# Patient Record
Sex: Female | Born: 1980 | Race: White | Hispanic: No | Smoking: Current every day smoker
Health system: Southern US, Community
[De-identification: ages and names within clinical notes are randomized; demographics above are authoritative.]

## PROBLEM LIST (undated history)

## (undated) DIAGNOSIS — G43909 Migraine, unspecified, not intractable, without status migrainosus: Secondary | ICD-10-CM

## (undated) DIAGNOSIS — Z9889 Other specified postprocedural states: Secondary | ICD-10-CM

## (undated) DIAGNOSIS — Z8709 Personal history of other diseases of the respiratory system: Secondary | ICD-10-CM

## (undated) DIAGNOSIS — F329 Major depressive disorder, single episode, unspecified: Secondary | ICD-10-CM

## (undated) DIAGNOSIS — G479 Sleep disorder, unspecified: Secondary | ICD-10-CM

## (undated) DIAGNOSIS — R519 Headache, unspecified: Secondary | ICD-10-CM

## (undated) DIAGNOSIS — T7840XA Allergy, unspecified, initial encounter: Secondary | ICD-10-CM

## (undated) DIAGNOSIS — F419 Anxiety disorder, unspecified: Secondary | ICD-10-CM

## (undated) DIAGNOSIS — J189 Pneumonia, unspecified organism: Secondary | ICD-10-CM

## (undated) DIAGNOSIS — R112 Nausea with vomiting, unspecified: Secondary | ICD-10-CM

## (undated) DIAGNOSIS — Z973 Presence of spectacles and contact lenses: Secondary | ICD-10-CM

## (undated) DIAGNOSIS — R51 Headache: Secondary | ICD-10-CM

## (undated) DIAGNOSIS — G8929 Other chronic pain: Secondary | ICD-10-CM

## (undated) DIAGNOSIS — M1990 Unspecified osteoarthritis, unspecified site: Secondary | ICD-10-CM

## (undated) DIAGNOSIS — J45909 Unspecified asthma, uncomplicated: Secondary | ICD-10-CM

## (undated) DIAGNOSIS — D320 Benign neoplasm of cerebral meninges: Secondary | ICD-10-CM

## (undated) DIAGNOSIS — G4700 Insomnia, unspecified: Secondary | ICD-10-CM

## (undated) DIAGNOSIS — F32A Depression, unspecified: Secondary | ICD-10-CM

## (undated) DIAGNOSIS — N920 Excessive and frequent menstruation with regular cycle: Secondary | ICD-10-CM

## (undated) HISTORY — PX: WISDOM TOOTH EXTRACTION: SHX21

## (undated) HISTORY — DX: Major depressive disorder, single episode, unspecified: F32.9

## (undated) HISTORY — DX: Headache: R51

## (undated) HISTORY — PX: OTHER SURGICAL HISTORY: SHX169

## (undated) HISTORY — DX: Allergy, unspecified, initial encounter: T78.40XA

## (undated) HISTORY — DX: Depression, unspecified: F32.A

## (undated) HISTORY — DX: Sleep disorder, unspecified: G47.9

## (undated) HISTORY — DX: Unspecified osteoarthritis, unspecified site: M19.90

## (undated) HISTORY — DX: Anxiety disorder, unspecified: F41.9

## (undated) HISTORY — DX: Pneumonia, unspecified organism: J18.9

## (undated) HISTORY — PX: DILATION AND CURETTAGE OF UTERUS: SHX78

---
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
## 1998-01-04 ENCOUNTER — Encounter: Admission: RE | Admit: 1998-01-04 | Discharge: 1998-01-04 | Payer: Self-pay | Admitting: Obstetrics

## 1998-01-17 ENCOUNTER — Encounter: Admission: RE | Admit: 1998-01-17 | Discharge: 1998-01-17 | Payer: Self-pay | Admitting: Obstetrics & Gynecology

## 1998-01-17 ENCOUNTER — Ambulatory Visit (HOSPITAL_COMMUNITY): Admission: RE | Admit: 1998-01-17 | Discharge: 1998-01-17 | Payer: Self-pay | Admitting: Obstetrics

## 1998-01-25 ENCOUNTER — Encounter: Admission: RE | Admit: 1998-01-25 | Discharge: 1998-01-25 | Payer: Self-pay | Admitting: *Deleted

## 1998-02-07 ENCOUNTER — Inpatient Hospital Stay (HOSPITAL_COMMUNITY): Admission: AD | Admit: 1998-02-07 | Discharge: 1998-02-10 | Payer: Self-pay | Admitting: *Deleted

## 1998-02-14 ENCOUNTER — Inpatient Hospital Stay (HOSPITAL_COMMUNITY): Admission: AD | Admit: 1998-02-14 | Discharge: 1998-02-14 | Payer: Self-pay | Admitting: Obstetrics

## 1998-02-16 ENCOUNTER — Inpatient Hospital Stay (HOSPITAL_COMMUNITY): Admission: AD | Admit: 1998-02-16 | Discharge: 1998-02-16 | Payer: Self-pay | Admitting: Obstetrics

## 1998-07-10 ENCOUNTER — Emergency Department (HOSPITAL_COMMUNITY): Admission: EM | Admit: 1998-07-10 | Discharge: 1998-07-10 | Payer: Self-pay | Admitting: Emergency Medicine

## 1998-07-10 ENCOUNTER — Encounter: Payer: Self-pay | Admitting: Emergency Medicine

## 2004-03-22 ENCOUNTER — Ambulatory Visit: Payer: Self-pay | Admitting: Family Medicine

## 2004-03-29 ENCOUNTER — Ambulatory Visit: Payer: Self-pay | Admitting: Family Medicine

## 2004-04-12 ENCOUNTER — Other Ambulatory Visit: Admission: RE | Admit: 2004-04-12 | Discharge: 2004-04-12 | Payer: Self-pay | Admitting: Family Medicine

## 2004-04-12 ENCOUNTER — Ambulatory Visit: Payer: Self-pay | Admitting: Family Medicine

## 2004-05-27 ENCOUNTER — Ambulatory Visit: Payer: Self-pay | Admitting: Family Medicine

## 2004-11-21 ENCOUNTER — Ambulatory Visit: Payer: Self-pay | Admitting: Family Medicine

## 2005-02-17 ENCOUNTER — Ambulatory Visit: Payer: Self-pay | Admitting: Family Medicine

## 2005-07-02 ENCOUNTER — Ambulatory Visit: Payer: Self-pay | Admitting: Internal Medicine

## 2005-11-10 ENCOUNTER — Ambulatory Visit: Payer: Self-pay | Admitting: Family Medicine

## 2005-11-11 ENCOUNTER — Ambulatory Visit: Payer: Self-pay | Admitting: Family Medicine

## 2005-11-11 ENCOUNTER — Encounter: Admission: RE | Admit: 2005-11-11 | Discharge: 2005-11-11 | Payer: Self-pay | Admitting: Family Medicine

## 2005-11-13 ENCOUNTER — Ambulatory Visit (HOSPITAL_COMMUNITY): Admission: RE | Admit: 2005-11-13 | Discharge: 2005-11-13 | Payer: Self-pay | Admitting: Family Medicine

## 2006-06-02 ENCOUNTER — Ambulatory Visit: Payer: Self-pay | Admitting: Internal Medicine

## 2006-07-20 ENCOUNTER — Ambulatory Visit: Payer: Self-pay | Admitting: Family Medicine

## 2006-12-17 DIAGNOSIS — N760 Acute vaginitis: Secondary | ICD-10-CM | POA: Insufficient documentation

## 2007-01-07 ENCOUNTER — Ambulatory Visit: Payer: Self-pay | Admitting: Family Medicine

## 2007-01-07 ENCOUNTER — Other Ambulatory Visit: Admission: RE | Admit: 2007-01-07 | Discharge: 2007-01-07 | Payer: Self-pay | Admitting: Family Medicine

## 2007-01-07 ENCOUNTER — Encounter: Payer: Self-pay | Admitting: Family Medicine

## 2007-01-07 DIAGNOSIS — F329 Major depressive disorder, single episode, unspecified: Secondary | ICD-10-CM

## 2007-01-07 DIAGNOSIS — R87619 Unspecified abnormal cytological findings in specimens from cervix uteri: Secondary | ICD-10-CM | POA: Insufficient documentation

## 2007-01-12 ENCOUNTER — Telehealth: Payer: Self-pay | Admitting: Family Medicine

## 2007-06-25 ENCOUNTER — Ambulatory Visit: Payer: Self-pay | Admitting: Family Medicine

## 2007-06-25 DIAGNOSIS — R51 Headache: Secondary | ICD-10-CM

## 2007-07-21 ENCOUNTER — Telehealth (INDEPENDENT_AMBULATORY_CARE_PROVIDER_SITE_OTHER): Payer: Self-pay | Admitting: *Deleted

## 2007-12-20 ENCOUNTER — Ambulatory Visit: Payer: Self-pay | Admitting: Family Medicine

## 2007-12-20 DIAGNOSIS — F411 Generalized anxiety disorder: Secondary | ICD-10-CM | POA: Insufficient documentation

## 2007-12-20 DIAGNOSIS — G479 Sleep disorder, unspecified: Secondary | ICD-10-CM

## 2007-12-22 ENCOUNTER — Telehealth: Payer: Self-pay | Admitting: Family Medicine

## 2008-02-11 DIAGNOSIS — J45909 Unspecified asthma, uncomplicated: Secondary | ICD-10-CM | POA: Insufficient documentation

## 2008-02-15 ENCOUNTER — Ambulatory Visit: Payer: Self-pay | Admitting: Family Medicine

## 2008-02-15 DIAGNOSIS — J069 Acute upper respiratory infection, unspecified: Secondary | ICD-10-CM | POA: Insufficient documentation

## 2008-02-15 DIAGNOSIS — F17200 Nicotine dependence, unspecified, uncomplicated: Secondary | ICD-10-CM

## 2008-05-09 ENCOUNTER — Ambulatory Visit: Payer: Self-pay | Admitting: Family Medicine

## 2008-05-18 ENCOUNTER — Encounter: Payer: Self-pay | Admitting: Family Medicine

## 2008-06-16 ENCOUNTER — Telehealth: Payer: Self-pay | Admitting: *Deleted

## 2008-06-27 ENCOUNTER — Telehealth: Payer: Self-pay | Admitting: Family Medicine

## 2008-09-15 ENCOUNTER — Ambulatory Visit: Payer: Self-pay | Admitting: Family Medicine

## 2009-01-04 ENCOUNTER — Telehealth: Payer: Self-pay | Admitting: Family Medicine

## 2009-07-13 ENCOUNTER — Ambulatory Visit: Payer: Self-pay | Admitting: Family Medicine

## 2010-02-07 ENCOUNTER — Telehealth: Payer: Self-pay | Admitting: Family Medicine

## 2010-04-30 NOTE — Letter (Signed)
Summary: Out of Work  Adult nurse at [employer]  [address]   [place], [place] [address]   Phone: [phone]  Fax: [phone]    July 13, 2009   Employee:  Angelica Diaz    To Whom It May Concern:   For Medical reasons, please excuse the above named employee from work for the following dates:  Start:   July 13, 2009  End:   July 16, 2009  If you need additional information, please feel free to contact our office.         Sincerely,    Kelle Darting, MD

## 2010-04-30 NOTE — Progress Notes (Signed)
Summary: REFILL REQUEST  Phone Note Refill Request Message from:  Patient on February 07, 2010 10:43 AM  Refills Requested: Medication #1:  HYDROMET 5-1.5 MG/5ML SYRP 1 to 2 tsps three times a day as needed.   Notes: Pt adv that she has cough, congestion - Offered OV, pt doesn't think she needs to be seen.Marland KitchenMarland KitchenReq refill be sent to [facility]  [address]    Initial call taken by: Debbra Riding,  February 07, 2010 10:44 AM  Follow-up for Phone Call        Hydromet 4 ounces one half to 1 teaspoon nightly p.r.n. for cough.  No refills ......... if symptoms persist after one week ......Marland Kitchenov Follow-up by: Roderick Pee MD,  February 07, 2010 3:14 PM    Prescriptions: HYDROMET 5-1.5 MG/5ML SYRP (HYDROCODONE-HOMATROPINE) 1 to 2 tsps three times a day as needed  #4 x 0   Entered by:   Kern Reap CMA (AAMA)   Authorized by:   Roderick Pee MD   Signed by:   Kern Reap CMA (AAMA) on 02/07/2010   Method used:   Telephoned to ...       [facility]  [address] [id number]       [address]       [place], [place]  [address]       Ph: [phone] or [phone]       Fax: [phone]   RxID:   [id number]

## 2010-04-30 NOTE — Assessment & Plan Note (Signed)
Summary: ?bronchitus/cjr   Vital Signs:  Patient profile:   30 year old female Height:      68 inches Weight:      173 pounds BMI:     26.40 Temp:     99.2 degrees F oral BP sitting:   98 / 76  (left arm) Cuff size:   regular  Vitals Entered By: Kern Reap CMA Duncan Dull) (July 13, 2009 8:49 AM) CC: cough, drainage, chills, ST Is Patient Diabetic? No   CC:  cough, drainage, chills, and ST.  History of Present Illness: Angelica Diaz is a 30 year old single female, G2, P2---------- children are 44 and 90------- smoker------- who comes in with a 3-day history of fever, chills, sore throat, headache, and cough.  Review of systems otherwise negative.  She was smoking 10 cigarettes a day because of severe, cough.  She's not smoked cigarettes in 24 hours.  In the past.  We gave her the chantix program.  She did it for 3 weeks.  Quit smoking, but then went back and start smoking and no side effects from medication  Allergies: No Known Drug Allergies  Past History:  Past medical, surgical, family and social histories (including risk factors) reviewed for relevance to current acute and chronic problems.  Past Medical History: Reviewed history from 12/20/2007 and no changes required. cb x2 Depression colposcopy 2007 for abnormal Pap went back for one follow-up never returned asymptomatic Headache Anxiety sleep disorder  Family History: Reviewed history from 06/25/2007 and no changes required. Family History Depression Family Hsitory Headaches  Social History: Reviewed history from 02/15/2008 and no changes required. Single  Occupation:  Alcohol use-no Drug use-no Regular exercise-no Current Smoker  Review of Systems      See HPI  Physical Exam  General:  Well-developed,well-nourished,in no acute distress; alert,appropriate and cooperative throughout examination Head:  Normocephalic and atraumatic without obvious abnormalities. No apparent alopecia or balding. Eyes:  No  corneal or conjunctival inflammation noted. EOMI. Perrla. Funduscopic exam benign, without hemorrhages, exudates or papilledema. Vision grossly normal. Ears:  External ear exam shows no significant lesions or deformities.  Otoscopic examination reveals clear canals, tympanic membranes are intact bilaterally without bulging, retraction, inflammation or discharge. Hearing is grossly normal bilaterally. Nose:  External nasal examination shows no deformity or inflammation. Nasal mucosa are pink and moist without lesions or exudates. Mouth:  Oral mucosa and oropharynx without lesions or exudates.  Teeth in good repair. Neck:  No deformities, masses, or tenderness noted. Chest Wall:  No deformities, masses, or tenderness noted. Lungs:  Normal respiratory effort, chest expands symmetrically. Lungs are clear to auscultation, no crackles or wheezes.   Problems:  Medical Problems Added: 1)  Dx of Viral Infection-unspec  (ICD-079.99)  Impression & Recommendations:  Problem # 1:  VIRAL INFECTION-UNSPEC (ICD-079.99) Assessment New  The following medications were removed from the medication list:    Hydromet 5-1.5 Mg/69ml Syrp (Hydrocodone-homatropine) .Marland Kitchen... 1 or 2 tsps three times a day as needed Her updated medication list for this problem includes:    Hydromet 5-1.5 Mg/28ml Syrp (Hydrocodone-homatropine) .Marland Kitchen... 1 to 2 tsps three times a day as needed  Orders: Prescription Created Electronically [phone]) Tobacco use cessation intermediate 3-10 minutes (47829)  Problem # 2:  TOBACCO ABUSE (ICD-305.1) Assessment: Improved  The following medications were removed from the medication list:    Chantix Continuing Month Pak 1 Mg Tabs (Varenicline tartrate) ..... Uad Her updated medication list for this problem includes:    Chantix Continuing Month Pak 1 Mg Tabs (  Varenicline tartrate) ..... Uad  Orders: Prescription Created Electronically [phone]) Tobacco use cessation intermediate 3-10 minutes  (60454)  Complete Medication List: 1)  Zoloft 100 Mg Tabs (Sertraline hcl) .Marland Kitchen.. 1 and 1/2 qam 2)  Chantix Continuing Month Pak 1 Mg Tabs (Varenicline tartrate) .... Uad 3)  Hydromet 5-1.5 Mg/82ml Syrp (Hydrocodone-homatropine) .Marland Kitchen.. 1 to 2 tsps three times a day as needed  Patient Instructions: 1)  continue not to smoke we start the chantix by taking a half of a blue pill daily x 1 week then one half tablet twice a day for 3 months. 2)  Rest at home, drink lots of liquids, Tylenol or aspirin for fever and chills, Hydromet one to 2 teaspoons 3 times a day p.r.n.Marland Kitchen  Return p.r.n. Prescriptions: HYDROMET 5-1.5 MG/5ML SYRP (HYDROCODONE-HOMATROPINE) 1 to 2 tsps three times a day as needed  #8oz x 1   Entered and Authorized by:   Roderick Pee MD   Signed by:   Roderick Pee MD on 07/13/2009   Method used:   Print then Give to Patient   RxID:   [id number] CHANTIX CONTINUING MONTH PAK 1 MG TABS (VARENICLINE TARTRATE) UAD  #1 x 3   Entered and Authorized by:   Roderick Pee MD   Signed by:   Roderick Pee MD on 07/13/2009   Method used:   [facility]        [facility]  [address] [id number]       [address]       [place], [place]  [address]       Ph: [phone] or [phone]       Fax: [phone]   RxID:   [id number]

## 2010-10-14 ENCOUNTER — Ambulatory Visit (INDEPENDENT_AMBULATORY_CARE_PROVIDER_SITE_OTHER): Payer: Self-pay | Admitting: Family Medicine

## 2010-10-14 ENCOUNTER — Encounter: Payer: Self-pay | Admitting: Family Medicine

## 2010-10-14 VITALS — BP 114/80 | HR 75 | Temp 98.5°F | Wt 157.0 lb

## 2010-10-14 DIAGNOSIS — N390 Urinary tract infection, site not specified: Secondary | ICD-10-CM

## 2010-10-14 DIAGNOSIS — N3090 Cystitis, unspecified without hematuria: Secondary | ICD-10-CM | POA: Insufficient documentation

## 2010-10-14 LAB — POCT URINALYSIS DIPSTICK
Bilirubin, UA: NEGATIVE
Glucose, UA: NEGATIVE
Ketones, UA: NEGATIVE
Nitrite, UA: NEGATIVE
Protein, UA: NEGATIVE
Spec Grav, UA: 1.02
Urobilinogen, UA: 0.2
pH, UA: 7

## 2010-10-14 MED ORDER — CEFTRIAXONE SODIUM 1 G IJ SOLR
1.0000 g | INTRAMUSCULAR | Status: AC
  Administered 2010-10-14: 1 g via INTRAMUSCULAR

## 2010-10-14 MED ORDER — CIPROFLOXACIN HCL 500 MG PO TABS: 500.0000 mg | ORAL_TABLET | Freq: Two times a day (BID) | ORAL | Status: AC

## 2010-10-14 NOTE — Progress Notes (Signed)
  Subjective:    Patient ID: Angelica Diaz, female    DOB: [birth year], 30 y.o.   MRN: [id number]  HPITara is a 30 year old single female, G2, P2, smoker, who comes in today for evaluation of 4 days history of frequency and dysuria and hematuria  Her last period was 10 days ago, normal.  Four days ago, she began having urinary tract symptoms.  Today, she began having flank pain, and chills.  No fever.  Last UTI was many, many years ago    Review of Systems    General and neurologic review of systems otherwise negative Objective:   Physical Exam    Well-developed nourished, female in no acute distress.  Examination had been to see and be flat.  The bowel sounds are normal.  No palpable masses, tenderness over the bladder.  No rebound, no flank tenderness    Assessment & Plan:  UTI question early pyeloplasty.  I am Rocephin followed by p.o. Cipro.  Follow-up in 3 days

## 2010-10-14 NOTE — Patient Instructions (Signed)
Drink lots of water.  Take the Cipro one twice daily.  Return on Thursday for follow-up.  Call if any problems

## 2010-10-14 NOTE — Progress Notes (Signed)
Addended by: Kern Reap B on: 10/14/2010 01:42 PM   Modules accepted: Orders

## 2010-10-15 LAB — URINE CULTURE
Colony Count: NO GROWTH
Organism ID, Bacteria: NO GROWTH

## 2010-10-16 ENCOUNTER — Telehealth: Payer: Self-pay | Admitting: Family Medicine

## 2010-10-16 MED ORDER — HYDROCODONE-ACETAMINOPHEN 7.5-750 MG PO TABS: 1.0000 | ORAL_TABLET | Freq: Four times a day (QID) | ORAL | Status: AC | PRN

## 2010-10-16 NOTE — Telephone Encounter (Signed)
Per Dr. Nelida Meuse instructions, the patient was advised to take Vicodin for the pain.  rx called in.

## 2010-10-16 NOTE — Telephone Encounter (Signed)
Pt called back this morning and stated that she isn't any better. She has an appt on tomorrow. Please advise. Back pain.

## 2010-10-17 ENCOUNTER — Ambulatory Visit: Payer: Self-pay | Admitting: Family Medicine

## 2010-11-18 ENCOUNTER — Telehealth: Payer: Self-pay | Admitting: Family Medicine

## 2010-11-18 NOTE — Telephone Encounter (Signed)
Spoke with patient.

## 2010-11-18 NOTE — Telephone Encounter (Signed)
Pt called back and is req that Dr Tawanna Cooler or his call her today asap re: Shingles Dx.

## 2010-11-18 NOTE — Telephone Encounter (Signed)
Bringing her into more so, we can see what is what

## 2010-11-18 NOTE — Telephone Encounter (Signed)
[facility] and was dx with shingles. Pt was advised to contact pcp to get treatment options. Pls call pt.

## 2010-11-19 ENCOUNTER — Ambulatory Visit (INDEPENDENT_AMBULATORY_CARE_PROVIDER_SITE_OTHER): Payer: Self-pay | Admitting: Family Medicine

## 2010-11-19 ENCOUNTER — Encounter: Payer: Self-pay | Admitting: Family Medicine

## 2010-11-19 VITALS — BP 108/78 | Temp 98.4°F | Wt 151.0 lb

## 2010-11-19 DIAGNOSIS — B029 Zoster without complications: Secondary | ICD-10-CM

## 2010-11-19 MED ORDER — ACYCLOVIR 400 MG PO TABS: 400.0000 mg | ORAL_TABLET | Freq: Every day | ORAL | Status: DC

## 2010-11-19 NOTE — Progress Notes (Signed)
  Subjective:    Patient ID: Angelica Diaz, female    DOB: Feb 10, 1981, 30 y.o.   MRN: [id number]  Gebhardt is a 30 year old single female, G2, P2, who comes in today with a 10 day history of a rash on her left posterior back.  She states about 10 days ago without any preceding pain.  She broke out in a rash.  The pain is minimal.  She is able to sleep.  It feels like pins-and-needles    Review of Systems General and dermatologic review of systems otherwise negative    Objective:   Physical Exam  Well-developed well-nourished, female in no acute distress.  Examination of the back shows a vesicular rash consistent with shingles, left upper back in the axillary area      Assessment & Plan:  Zoster plan acyclovir return p.r.n.

## 2010-11-19 NOTE — Patient Instructions (Signed)
Take the acyclovir 3 in the morning and two at bedtime until rash clears

## 2011-03-10 ENCOUNTER — Telehealth: Payer: Self-pay | Admitting: Family Medicine

## 2011-03-10 ENCOUNTER — Ambulatory Visit: Payer: Self-pay | Admitting: Family Medicine

## 2011-03-10 MED ORDER — HYDROCODONE-HOMATROPINE 5-1.5 MG/5ML PO SYRP: 2.5000 mL | ORAL_SOLUTION | Freq: Every evening | ORAL | Status: AC | PRN

## 2011-03-10 NOTE — Telephone Encounter (Signed)
Hydromet 4 ounces directions one half to 1 teaspoon nightly p.r.n. Cough, refills x 1 

## 2011-03-10 NOTE — Telephone Encounter (Signed)
Refill Hydromet to [facility] in [place]. Thanks.

## 2011-03-10 NOTE — Telephone Encounter (Signed)
rx called in and patient is aware 

## 2011-03-14 ENCOUNTER — Encounter: Payer: Self-pay | Admitting: Family Medicine

## 2011-03-14 ENCOUNTER — Ambulatory Visit (INDEPENDENT_AMBULATORY_CARE_PROVIDER_SITE_OTHER): Payer: Self-pay | Admitting: Family Medicine

## 2011-03-14 DIAGNOSIS — F17200 Nicotine dependence, unspecified, uncomplicated: Secondary | ICD-10-CM

## 2011-03-14 DIAGNOSIS — J069 Acute upper respiratory infection, unspecified: Secondary | ICD-10-CM

## 2011-03-14 DIAGNOSIS — J45901 Unspecified asthma with (acute) exacerbation: Secondary | ICD-10-CM

## 2011-03-14 MED ORDER — HYDROCODONE-HOMATROPINE 5-1.5 MG/5ML PO SYRP: ORAL_SOLUTION | ORAL | Status: DC

## 2011-03-14 MED ORDER — PREDNISONE 20 MG PO TABS: ORAL_TABLET | ORAL | Status: DC

## 2011-03-14 NOTE — Patient Instructions (Signed)
Rest at home today, Saturday and Sunday.  Tylenol or aspirin for fever, chills.  No smoking........... Stopped completely............ chantix one half tab daily for two months.  Hydromet one half to 1 teaspoon 3 to 4 times daily p.r.n. For cough.  Prednisone as directed.  Resume your normal activities on Monday

## 2011-03-14 NOTE — Progress Notes (Signed)
  Subjective:    Patient ID: Angelica Diaz, female    DOB: 10-Oct-1980, 30 y.o.   MRN: [id number]  HPI Angelica Diaz is a 30 year old single female, smoker, who comes in with a 4-day history of fever, chills, and cough.  No earache, nausea, vomiting, no sputum production.  She's not smoked a cigarette in 3 days   Review of Systems    General and pulmonary is systems otherwise negative Objective:   Physical Exam Well-developed well-nourished, female, in no acute distress.  HEENT negative.  Neck was supple.  No adenopathy.  Lungs are clear except for mild late expiratory wheezing       Assessment & Plan:  Viral syndrome with secondary asthma, tobacco abuse.  Plan stop smoking, chantix a half a tab a day, lots of liquids, Hydromet, short course of prednisone

## 2011-08-19 ENCOUNTER — Ambulatory Visit (INDEPENDENT_AMBULATORY_CARE_PROVIDER_SITE_OTHER): Payer: Self-pay | Admitting: Family Medicine

## 2011-08-19 ENCOUNTER — Encounter: Payer: Self-pay | Admitting: Family Medicine

## 2011-08-19 VITALS — BP 108/78 | Temp 99.0°F | Wt 172.0 lb

## 2011-08-19 DIAGNOSIS — G479 Sleep disorder, unspecified: Secondary | ICD-10-CM

## 2011-08-19 DIAGNOSIS — G478 Other sleep disorders: Secondary | ICD-10-CM

## 2011-08-19 MED ORDER — AMITRIPTYLINE HCL 25 MG PO TABS: 25.0000 mg | ORAL_TABLET | Freq: Every day | ORAL | Status: DC

## 2011-08-19 NOTE — Patient Instructions (Signed)
Begin the Elavil 25 mg at bedtime  Return in 4 weeks for followup sooner if any problems

## 2011-08-19 NOTE — Progress Notes (Signed)
  Subjective:    Patient ID: Angelica Diaz, female    DOB: 12-Feb-1981, 31 y.o.   MRN: [id number]  HPI Angelica Diaz is a 31 year old single female who comes in today for evaluation of insomnia x5 years  Her sleep pattern is that she typically will go to bed at 9:30 PM when I go to sleep until midnight and then wakes up every 2-3 hours during the night. We've treated this in the past with SSRIs however she had side effects from SSRIs and didn't take them anymore  Review of systems negative including the fact she only drinks very minimal amounts of caffeine   Review of Systems General and psychiatric review of systems otherwise negative    Objective:   Physical Exam Well-developed well-nourished female in no acute distress       Assessment & Plan:  Chronic insomnia plan trial of Elavil followup in 4 weeks

## 2011-09-16 ENCOUNTER — Ambulatory Visit: Payer: Self-pay | Admitting: Family Medicine

## 2011-09-16 DIAGNOSIS — Z0289 Encounter for other administrative examinations: Secondary | ICD-10-CM

## 2011-11-27 ENCOUNTER — Other Ambulatory Visit: Payer: Self-pay | Admitting: Family Medicine

## 2011-11-27 NOTE — Telephone Encounter (Signed)
Okay per Dr Todd 

## 2011-11-27 NOTE — Telephone Encounter (Signed)
Pt would like a refill klonipin call into [facility] [place] [phone]

## 2011-11-28 MED ORDER — CLONAZEPAM 0.5 MG PO TABS: 0.5000 mg | ORAL_TABLET | Freq: Every evening | ORAL | Status: DC | PRN

## 2011-11-28 NOTE — Telephone Encounter (Signed)
Rx phoned in.   

## 2011-12-23 ENCOUNTER — Other Ambulatory Visit: Payer: Self-pay | Admitting: Family Medicine

## 2011-12-31 ENCOUNTER — Telehealth: Payer: Self-pay | Admitting: Family Medicine

## 2011-12-31 MED ORDER — HYDROCODONE-HOMATROPINE 5-1.5 MG/5ML PO SYRP: 2.5000 mL | ORAL_SOLUTION | Freq: Every evening | ORAL | Status: DC | PRN

## 2011-12-31 NOTE — Telephone Encounter (Signed)
Rx called in 

## 2011-12-31 NOTE — Telephone Encounter (Signed)
Hydromet 4 ounces 1/2-1 teaspoon each bedtime. No refills

## 2011-12-31 NOTE — Telephone Encounter (Signed)
Patient called stating that she need a refill of her HYDROMET 5-1.5 MG/5ML syrup. Please assist.

## 2012-01-01 ENCOUNTER — Telehealth: Payer: Self-pay | Admitting: Family Medicine

## 2012-01-01 NOTE — Telephone Encounter (Signed)
Spoke with phamacist

## 2012-01-01 NOTE — Telephone Encounter (Signed)
Pt called to check on status of getting auth to get refill on cough syrup. Pt said that nurse or pcp has to call and speak directly to the pharmacist and say its ok to refill the script.

## 2012-01-01 NOTE — Telephone Encounter (Signed)
Pt called and said that pharmacy will not refill the cough syrup, unless they get a call from the pcp saying its ok to fill. Pls call  [id number] [facility] [place], [place] and tell pharmacist ok for pt to go ahead fill.

## 2012-01-12 ENCOUNTER — Telehealth: Payer: Self-pay | Admitting: Family Medicine

## 2012-01-12 ENCOUNTER — Telehealth: Payer: Self-pay | Admitting: *Deleted

## 2012-01-12 DIAGNOSIS — G4700 Insomnia, unspecified: Secondary | ICD-10-CM

## 2012-01-12 MED ORDER — AMITRIPTYLINE HCL 50 MG PO TABS: 50.0000 mg | ORAL_TABLET | Freq: Every day | ORAL | Status: DC

## 2012-01-12 NOTE — Telephone Encounter (Signed)
If that combination does not work then I would recommend we get a consult from Emerson Monte she can call and make an appointment

## 2012-01-12 NOTE — Telephone Encounter (Signed)
Increase Elavil to 50 mg daily call in 3 weeks with a progress report

## 2012-01-12 NOTE — Telephone Encounter (Signed)
Rx sent and patient is aware. 

## 2012-01-12 NOTE — Telephone Encounter (Signed)
Patient states that she is already tried taking 2 Elavil at night and Klonopin and she still is not resting.  Is there anything else she can try?

## 2012-01-12 NOTE — Telephone Encounter (Signed)
Pt states that the amitriptyline (ELAVIL) 25 MG tablet and the Klonopin are not working for sleep. She wants to know if you will send in for something different, or if she should come in for an OV to discuss. Please advise. Thanks.

## 2012-01-13 NOTE — Telephone Encounter (Signed)
Patient is aware and referral request sent 

## 2012-03-15 ENCOUNTER — Other Ambulatory Visit: Payer: Self-pay | Admitting: Family Medicine

## 2012-04-09 ENCOUNTER — Other Ambulatory Visit: Payer: Self-pay | Admitting: Family Medicine

## 2012-04-09 NOTE — Telephone Encounter (Signed)
Pt needs refill of HYDROcodone-homatropine (HYCODAN) 5-1.5 MG/5ML syrup, today of possible .[facility], [place]

## 2012-04-18 ENCOUNTER — Other Ambulatory Visit: Payer: Self-pay | Admitting: Family Medicine

## 2012-07-06 ENCOUNTER — Encounter: Payer: Self-pay | Admitting: Family Medicine

## 2012-07-06 ENCOUNTER — Ambulatory Visit (INDEPENDENT_AMBULATORY_CARE_PROVIDER_SITE_OTHER): Payer: BC Managed Care – PPO | Admitting: Family Medicine

## 2012-07-06 ENCOUNTER — Telehealth: Payer: Self-pay | Admitting: Family Medicine

## 2012-07-06 VITALS — BP 120/88 | Temp 98.9°F | Wt 224.0 lb

## 2012-07-06 DIAGNOSIS — R300 Dysuria: Secondary | ICD-10-CM

## 2012-07-06 DIAGNOSIS — G479 Sleep disorder, unspecified: Secondary | ICD-10-CM

## 2012-07-06 DIAGNOSIS — N760 Acute vaginitis: Secondary | ICD-10-CM

## 2012-07-06 LAB — POCT URINALYSIS DIPSTICK
Glucose, UA: NEGATIVE
Ketones, UA: NEGATIVE
Spec Grav, UA: 1.015

## 2012-07-06 NOTE — Telephone Encounter (Addendum)
Pt has had UTI (she thinks) for 4 weeks. Pt says her urine has a STRONG odor.  No pain, just the odor.   Pt wanted to come in today or ASAP. No appt this week. Pls advise.

## 2012-07-06 NOTE — Telephone Encounter (Signed)
Done

## 2012-07-06 NOTE — Telephone Encounter (Signed)
Spoke with patient. Please schedule today at 12:15

## 2012-07-06 NOTE — Progress Notes (Signed)
  Subjective:    Patient ID: Angelica Diaz, female    DOB: 1980-10-18, 32 y.o.   MRN: [id number]  HPI  Angelica Diaz is a 32 year old female smoker who comes in today for evaluation of and vaginal odor for one month  She's been seeing her GYN for Pap smears. She had a Pap smear in the fall which was normal. LMP 16 months ago. She's getting the Depakote shots  About a month motion noticed a strange odor in her vaginal area. She wonders if it might be a urinary tract infection although she's not having urinary tract symptoms.   Review of Systems    review of systems otherwise negative Objective:   Physical Exam  Well-developed well-nourished female no acute distress examination the abdomen is negative. Vaginal exam showed marked erythema bimanual exam negative urine normal      Assessment & Plan:

## 2012-07-06 NOTE — Patient Instructions (Signed)
Monistat 7......Marland Kitchen Apply each bedtime for one week return when necessary

## 2012-07-15 ENCOUNTER — Other Ambulatory Visit: Payer: Self-pay | Admitting: *Deleted

## 2012-07-15 MED ORDER — FLUCONAZOLE 150 MG PO TABS: ORAL_TABLET | ORAL | Status: DC

## 2012-08-27 ENCOUNTER — Telehealth: Payer: Self-pay | Admitting: *Deleted
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
## 2012-09-02 ENCOUNTER — Telehealth: Payer: Self-pay | Admitting: Family Medicine

## 2012-09-02 MED ORDER — METRONIDAZOLE 250 MG PO TABS: 250.0000 mg | ORAL_TABLET | Freq: Two times a day (BID) | ORAL | Status: DC

## 2012-09-02 NOTE — Telephone Encounter (Signed)
Pt was seen for yeast infection. Pt states on the fluconazole (DIFLUCAN) 150 MG tablet the yeast infection has come back. Pt would like to know if there is something stronger that she needs to take, or do the DIFLUCAN again. Pt states she had the yeast infection for about 3 months from the start. Pharm:  [facility]/ [place]

## 2012-09-02 NOTE — Telephone Encounter (Signed)
Rachel let's give [place], Flagyl 250 mg twice a day for 2 weeks for the vaginitis,,,,,,,, if that does not resolve her symptoms she'll need to come in for an office visit for cultures etc.

## 2012-09-02 NOTE — Telephone Encounter (Signed)
Rx sent to pharmacy. Left message on machine for patient.  

## 2012-09-10 ENCOUNTER — Ambulatory Visit (INDEPENDENT_AMBULATORY_CARE_PROVIDER_SITE_OTHER): Payer: Self-pay | Admitting: Family Medicine

## 2012-09-10 ENCOUNTER — Other Ambulatory Visit (HOSPITAL_COMMUNITY)
Admission: RE | Admit: 2012-09-10 | Discharge: 2012-09-10 | Disposition: A | Discharge status: Home / Self Care | Payer: BC Managed Care – PPO | Source: Ambulatory Visit | Attending: Family Medicine | Admitting: Family Medicine

## 2012-09-10 ENCOUNTER — Encounter: Payer: Self-pay | Admitting: Family Medicine

## 2012-09-10 VITALS — BP 110/70 | Temp 98.3°F | Wt 220.0 lb

## 2012-09-10 DIAGNOSIS — N390 Urinary tract infection, site not specified: Secondary | ICD-10-CM

## 2012-09-10 DIAGNOSIS — Z113 Encounter for screening for infections with a predominantly sexual mode of transmission: Secondary | ICD-10-CM | POA: Insufficient documentation

## 2012-09-10 DIAGNOSIS — N898 Other specified noninflammatory disorders of vagina: Secondary | ICD-10-CM

## 2012-09-10 DIAGNOSIS — L292 Pruritus vulvae: Secondary | ICD-10-CM

## 2012-09-10 DIAGNOSIS — L293 Anogenital pruritus, unspecified: Secondary | ICD-10-CM

## 2012-09-10 DIAGNOSIS — N760 Acute vaginitis: Secondary | ICD-10-CM | POA: Insufficient documentation

## 2012-09-10 LAB — POCT URINALYSIS DIPSTICK
Bilirubin, UA: 1
Leukocytes, UA: NEGATIVE
Nitrite, UA: NEGATIVE
Protein, UA: 1
pH, UA: 5.5

## 2012-09-10 NOTE — Patient Instructions (Signed)
-  no harsh detergents or soaps or douches - only hypoallergenic products  -We have ordered labs or studies at this visit. It can take up to 1-2 weeks for results and processing. We will contact you with instructions IF your results are abnormal. Normal results will be released to your [facility]. If you have not heard from [place] or can not find your results in [facility] in 2 weeks please contact our office.

## 2012-09-10 NOTE — Progress Notes (Signed)
Chief Complaint  Patient presents with  . Urinary Tract Infection    HPI:  Acute visit for dysuria: -reports started a few weeks ago -symptoms: dysuria - burning with urinary, odor to urine, more vaginal discharge then usual -denies: fevers, chills, NV, pelvic or flank pain, frequency or urgency, vag bleeding -recently tx with diflucan and flagyl - didn't help -FDLMP: on depo and no longer gets periods, sexually active, no new partnres, not worried about STIs ROS: See pertinent positives and negatives per HPI.  Past Medical History  Diagnosis Date  . Depression   . Headache(784.0)   . Anxiety   . Sleep disorder     Family History  Problem Relation Age of Onset  . Diabetes Maternal Grandfather     History   Social History  . Marital Status: Single    Spouse Name: N/A    Number of Children: N/A  . Years of Education: N/A   Social History Main Topics  . Smoking status: Current Every Day Smoker -- 0.50 packs/day  . Smokeless tobacco: None  . Alcohol Use: No  . Drug Use: No  . Sexually Active:    Other Topics Concern  . None   Social History Narrative  . None    Current outpatient prescriptions:amitriptyline (ELAVIL) 50 MG tablet, take 1 tablet by mouth at bedtime, Disp: 90 tablet, Rfl: 2;  clonazePAM (KLONOPIN) 0.5 MG tablet, take 1 tablet by mouth at bedtime if needed, Disp: 90 tablet, Rfl: 2;  fluconazole (DIFLUCAN) 150 MG tablet, One now and one in one week, Disp: 2 tablet, Rfl: 0;  metroNIDAZOLE (FLAGYL) 250 MG tablet, Take 1 tablet (250 mg total) by mouth 2 (two) times daily., Disp: 28 tablet, Rfl: 0  EXAM:  Filed Vitals:   09/10/12 0809  BP: 110/70  Temp: 98.3 F (36.8 C)    Body mass index is 33.46 kg/(m^2).  GENERAL: vitals reviewed and listed above, alert, oriented, appears well hydrated and in no acute distress  HEENT: atraumatic, conjunttiva clear, no obvious abnormalities on inspection of external nose and ears  NECK: no obvious masses on  inspection  LUNGS: clear to auscultation bilaterally, no wheezes, rales or rhonchi, good air movement  CV: HRRR, no peripheral edema  ABD: BS+,abd soft, NTTP  GU: normal, no CMT, no abnormal discharge  MS: moves all extremities without noticeable abnormality  PSYCH: pleasant and cooperative, no obvious depression or anxiety  ASSESSMENT AND PLAN:  Discussed the following assessment and plan:  UTI (urinary tract infection) - Plan: POCT urinalysis dipstick  Vaginal discharge - Plan: Cervicovaginal ancillary only  Vulvovaginal pruritus - Plan: Culture, Urine, Cervicovaginal ancillary only  -urine culture, wet prep, GC/Chlam pending. Will contact pt with results.If all neg advised she discuss with PCP to see specialist urology or gyn. -Patient advised to return or notify a doctor immediately if symptoms worsen or persist or new concerns arise.  Patient Instructions  -no harsh detergents or soaps or douches - only hypoallergenic products  -We have ordered labs or studies at this visit. It can take up to 1-2 weeks for results and processing. We will contact you with instructions IF your results are abnormal. Normal results will be released to your [facility]. If you have not heard from [place] or can not find your results in [facility] in 2 weeks please contact our office.            Kriste Basque R.

## 2012-09-12 LAB — URINE CULTURE: Colony Count: 75000

## 2012-09-13 MED ORDER — NITROFURANTOIN MONOHYD MACRO 100 MG PO CAPS: 100.0000 mg | ORAL_CAPSULE | Freq: Two times a day (BID) | ORAL | Status: DC

## 2012-09-13 NOTE — Progress Notes (Signed)
Quick Note:  Called and spoke with pt and pt is aware. Rx sent to pharmacy. ______ 

## 2012-09-13 NOTE — Addendum Note (Signed)
Addended by: Azucena Freed on: 09/13/2012 02:15 PM   Modules accepted: Orders

## 2012-09-14 NOTE — Progress Notes (Signed)
Quick Note:  Called and spoke with pt and pt is aware. ______ 

## 2012-09-15 ENCOUNTER — Telehealth: Payer: Self-pay | Admitting: Family Medicine

## 2012-09-15 NOTE — Telephone Encounter (Signed)
Pt had yeast infeciton - Dr Tawanna Cooler gave her Diflucan - worked at first, then cam eback. He gave her Flagyl BID for almost 2wks - it did not help. She came back in last Fri and saw Dr Selena Batten - UA done which was inconclusive for UTI. Pt thinks she has Bacterial Vaginosis really bad - not a UTI. Dr. Selena Batten rx'd Macrobid. Pt doesn't think that's right - since she's had BV before and does NOT think she has UTI. Since Flagyl did not work for BV, she wants to know if Dr Tawanna Cooler can rx something stronger. Please call.

## 2012-09-16 ENCOUNTER — Ambulatory Visit (INDEPENDENT_AMBULATORY_CARE_PROVIDER_SITE_OTHER): Payer: BC Managed Care – PPO | Admitting: Family Medicine

## 2012-09-16 ENCOUNTER — Encounter: Payer: Self-pay | Admitting: Family Medicine

## 2012-09-16 ENCOUNTER — Other Ambulatory Visit (HOSPITAL_COMMUNITY)
Admission: RE | Admit: 2012-09-16 | Discharge: 2012-09-16 | Disposition: A | Discharge status: Home / Self Care | Payer: BC Managed Care – PPO | Source: Ambulatory Visit | Attending: Family Medicine | Admitting: Family Medicine

## 2012-09-16 VITALS — BP 120/84 | Temp 98.5°F | Wt 221.0 lb

## 2012-09-16 DIAGNOSIS — N760 Acute vaginitis: Secondary | ICD-10-CM

## 2012-09-16 DIAGNOSIS — F329 Major depressive disorder, single episode, unspecified: Secondary | ICD-10-CM

## 2012-09-16 DIAGNOSIS — F17200 Nicotine dependence, unspecified, uncomplicated: Secondary | ICD-10-CM

## 2012-09-16 DIAGNOSIS — B029 Zoster without complications: Secondary | ICD-10-CM

## 2012-09-16 DIAGNOSIS — Z113 Encounter for screening for infections with a predominantly sexual mode of transmission: Secondary | ICD-10-CM | POA: Insufficient documentation

## 2012-09-16 DIAGNOSIS — A6004 Herpesviral vulvovaginitis: Secondary | ICD-10-CM | POA: Insufficient documentation

## 2012-09-16 MED ORDER — ACYCLOVIR 400 MG PO TABS: 400.0000 mg | ORAL_TABLET | Freq: Every day | ORAL | Status: AC

## 2012-09-16 MED ORDER — SERTRALINE HCL 100 MG PO TABS: 100.0000 mg | ORAL_TABLET | Freq: Every day | ORAL | Status: DC

## 2012-09-16 MED ORDER — METRONIDAZOLE 500 MG PO TABS: ORAL_TABLET | ORAL | Status: DC

## 2012-09-16 NOTE — Patient Instructions (Addendum)
Restart the Zoloft 100 mg daily  Acyclovir 400 mg,,,,,,,,,,, 2 tabs 3 times daily for 10 days  Restart Flagyl 500 mg,,,,,,,, one twice daily for 10 days  Return in 4 weeks for followup

## 2012-09-16 NOTE — Telephone Encounter (Signed)
Scheduled patient for visit today

## 2012-09-16 NOTE — Progress Notes (Signed)
  Subjective:    Patient ID: Angelica Diaz, female    DOB: 01-11-1981, 32 y.o.   MRN: [id number]  HPI Anetta is a 32 year old single female with a teenage daughter who comes in today for evaluation of 3 problems  We had seen her on June 1 with a diagnosis of BV and given her Flagyl. She took it for 10 days as directed the symptoms diminished somewhat but never went away and now it's worse. She has a slight discharge but the main thing is the malodorous  Her last period was a year and a half ago. [facility] followup Pap smears. She states she had a Pap smear in August which was normal  She's also had a recurrence of her herpes labialis which started a couple days ago.  Also in the past year she's gained 70 pounds. She states when she depressed she eats. She would like to restart her Zoloft   Review of Systems Review of systems otherwise negative    Objective:   Physical Exam Well-developed well-nourished female no acute distress examination the abdomen is negative except for stretch marks from previous pregnancy. Pelvic examination external genitalia area shows some mild herpetic lesions on both labia. Culture was taken       Assessment & Plan:  BV plan culture restart Flagyl  HSV 2 restart acyclovir  History of depression restart Zoloft 100 mg daily followup in 6 weeks

## 2012-09-17 ENCOUNTER — Telehealth: Payer: Self-pay | Admitting: *Deleted

## 2012-09-17 NOTE — Telephone Encounter (Signed)
Received a call from [facility] at the [facility].  They received the swab and requested testing.  They cannot do the Herpes from the swab.  Herpes can only be done in a Thin Prep container, therefore the herpes test will not be done.  FYI

## 2012-09-20 NOTE — Telephone Encounter (Signed)
Spoke with Angelica Diaz. Herpes test entered in error.

## 2012-09-28 ENCOUNTER — Telehealth: Payer: Self-pay | Admitting: Family Medicine

## 2012-09-28 NOTE — Telephone Encounter (Signed)
Pt states she was given rx for Flagyl. She said it's not working. Please advise.

## 2012-09-30 NOTE — Telephone Encounter (Signed)
Cindy please call ,,,,,,,,,, have her call [facility] and see Dr. Ivonne Andrew  or Dr. Ilda Mori for a consult because the medicines were given you are not working. She'll just need to call she can make her an appointment just use my name and no get her in right away next week

## 2012-10-04 ENCOUNTER — Other Ambulatory Visit: Payer: Self-pay | Admitting: Family Medicine

## 2012-10-08 ENCOUNTER — Telehealth: Payer: Self-pay | Admitting: Family Medicine

## 2012-10-08 NOTE — Telephone Encounter (Signed)
Pt states that Dr Tawanna Cooler sent in an ok for a refill on her Klonopin. However, the pharm will not let her fill it until 7/20, because on the bottle it states "take 1 at bedtime". Per pharmacy, to avoid this problem in the future (this happened last time as well, per pt), the directions need to say "take 1-2 at bedtime." Please advise.

## 2012-10-11 MED ORDER — CLONAZEPAM 0.5 MG PO TABS: ORAL_TABLET | ORAL | Status: DC

## 2012-10-11 NOTE — Telephone Encounter (Signed)
Okay to give her a six-month supply of Klonopin

## 2012-10-14 ENCOUNTER — Ambulatory Visit (INDEPENDENT_AMBULATORY_CARE_PROVIDER_SITE_OTHER): Payer: BC Managed Care – PPO | Admitting: Family Medicine

## 2012-10-14 ENCOUNTER — Encounter: Payer: Self-pay | Admitting: Family Medicine

## 2012-10-14 VITALS — BP 110/80 | Temp 98.6°F | Wt 218.0 lb

## 2012-10-14 DIAGNOSIS — N760 Acute vaginitis: Secondary | ICD-10-CM

## 2012-10-14 MED ORDER — METRONIDAZOLE 0.75 % VA GEL: VAGINAL | Status: DC

## 2012-10-14 NOTE — Progress Notes (Signed)
  Subjective:    Patient ID: Angelica Diaz, female    DOB: 24-Nov-1980, 32 y.o.   MRN: [id number]  HPI Angelica Diaz is a [age]-year-old single female G1 P1 he is engaged to be married who comes in today to discuss a persistent malodorous vaginitis  We have done a complete workup and we cannot find any etiology except for BV. We gave her 2 g of Flagyl and it hadn't helped. She states she has trouble swallowing the Flagyl and sometimes may miss a dose.  Her husband to be is getting a facetectomy in October. She's gained a lot of weight on the Depakote shots.   Review of Systems    review of systems negative Objective:   Physical Exam Well-developed well-nourished female no acute distress pelvic exam not repeated it's been done twice       Assessment & Plan:  Persistent BV switched to the vaginal cream

## 2012-10-14 NOTE — Patient Instructions (Signed)
Use the metronidazole gel one applicator nightly at bedtime.......Marland Kitchen If after the first round he is still symptomatic repeated  If after that you're having persistent symptoms then I would recommend a consult with Dr. Dessie Coma GYN

## 2012-12-02 ENCOUNTER — Telehealth: Payer: Self-pay | Admitting: Family Medicine

## 2012-12-02 ENCOUNTER — Encounter: Payer: Self-pay | Admitting: Family Medicine

## 2012-12-02 ENCOUNTER — Ambulatory Visit (INDEPENDENT_AMBULATORY_CARE_PROVIDER_SITE_OTHER): Payer: Self-pay | Admitting: Family Medicine

## 2012-12-02 VITALS — BP 108/78 | Temp 98.5°F | Wt 208.0 lb

## 2012-12-02 DIAGNOSIS — R202 Paresthesia of skin: Secondary | ICD-10-CM

## 2012-12-02 DIAGNOSIS — R209 Unspecified disturbances of skin sensation: Secondary | ICD-10-CM

## 2012-12-02 DIAGNOSIS — Z8669 Personal history of other diseases of the nervous system and sense organs: Secondary | ICD-10-CM

## 2012-12-02 NOTE — Patient Instructions (Signed)
-  We placed a referral for you as discussed to the neurologist. It usually takes about 1-2 weeks to process and schedule this referral. If you have not heard from [place] regarding this appointment in 2 weeks please contact our office.  -seek care/go to emergency room if recurring symptoms, weakness, numbness

## 2012-12-02 NOTE — Progress Notes (Signed)
Chief Complaint  Patient presents with  . facial numbness    HPI:  Angelica Diaz, a 32 yo patient of Dr. Tawanna Cooler with PMH anxiety, depression, headaches, sleep disorder, migraines, here for follow up of migraine: -long history of frequent headaches and migraines since she was a teenager, now gets headaches less frequently but still gets bad/severe migraines from time to time -had migraine last night and was accompanied by brief episode of sensation of numbness or tingling in entire face and felt lightheaded briefly too and felt like vision was different prior to onset of headache - thinks lasted maybe 20 minutes, then headache/one of her severe migraines lasted until this morning -no symptoms today -denies: fevers, chills, any residual symptoms, weakness with episode, CP, palpitations, drooping of face, visual distubances -she reports used to take migraine medications in the past but didn't work -  reports only vicodin with prednisone works for her migraines -has had nausea and vomiting with migraines before -she take klonopin and zoloft on a regular basis, no SI or worsening depression  ROS: See pertinent positives and negatives per HPI.  Past Medical History  Diagnosis Date  . Depression   . Headache(784.0)   . Anxiety   . Sleep disorder     No past surgical history on file.  Family History  Problem Relation Age of Onset  . Diabetes Maternal Grandfather     History   Social History  . Marital Status: Single    Spouse Name: N/A    Number of Children: N/A  . Years of Education: N/A   Social History Main Topics  . Smoking status: Current Every Day Smoker -- 0.50 packs/day  . Smokeless tobacco: None  . Alcohol Use: No  . Drug Use: No  . Sexual Activity:    Other Topics Concern  . None   Social History Narrative  . None    Current outpatient prescriptions:clonazePAM (KLONOPIN) 0.5 MG tablet, 1-2 tabs at bedtime as needed, Disp: 180 tablet, Rfl: 1;  sertraline  (ZOLOFT) 100 MG tablet, Take 1 tablet (100 mg total) by mouth daily., Disp: 100 tablet, Rfl: 3;  metroNIDAZOLE (METROGEL) 0.75 % vaginal gel, 1 applicator nightly at bedtime, Disp: 70 g, Rfl: 2  EXAM:  Filed Vitals:   12/02/12 1543  BP: 108/78  Temp: 98.5 F (36.9 C)    Body mass index is 31.63 kg/(m^2).  GENERAL: vitals reviewed and listed above, alert, oriented, appears well hydrated and in no acute distress  HEENT: atraumatic, PERRLA, visual acuity grossly intact, conjunttiva clear, no obvious abnormalities on inspection of external nose and ears, normal appearance of ear canals and TMs,, no tonsillar edema or exudate, no sinus TTP  NECK: no obvious masses on inspection, no bruits  LUNGS: clear to auscultation bilaterally, no wheezes, rales or rhonchi, good air movement  CV: HRRR, no peripheral edema  MS: moves all extremities without noticeable abnormality  PSYCH: pleasant and cooperative, no obvious depression or anxiety  NEURO: CN II-XII grossly intact, finger to nose normal, no pronator drift  ASSESSMENT AND PLAN:  Discussed the following assessment and plan:  Hx of migraine headaches - Plan: Ambulatory referral to Neurology  Paresthesia - Plan: Ambulatory referral to Neurology  -symptoms completely resolved and normal neuro exam and I suspect this was migraine related - discussed other potential, but much less likely etiologies, she will take asa daily and go to ED if recurring symptoms, weakness, worst HA or other worrisome symptoms -ptwants to see neurology - referral  placed for this and migraines which have not responded well to traditional treatments in the past and she reports only thing that works is Vicodin and prednisone but reports PCP did not want her to take these -Patient advised to return or notify a doctor immediately if symptoms worsen or persist or new concerns arise.  Patient Instructions  -We placed a referral for you as discussed to the  neurologist. It usually takes about 1-2 weeks to process and schedule this referral. If you have not heard from [place] regarding this appointment in 2 weeks please contact our office.  -seek care/go to emergency room if recurring symptoms, weakness, numbness      KIM, HANNAH R.

## 2012-12-02 NOTE — Telephone Encounter (Signed)
Patient Information:  Caller Name: Angelica Diaz  Phone: [phone]  Patient: Angelica, Diaz  Gender: Female  DOB: 10/14/1980  Age: 32 Years  PCP: Kelle Darting [facility])  Pregnant: No  Office Follow Up:  Does the office need to follow up with this patient?: No  Instructions For The Office: N/A  RN Note:  Current patient is asymptomatic except for lingering headache from migraine of last night.  Triaged with care advice given.  Appointment scheduled with Dr.Kim at 15:45 due to Dr. Tawanna Cooler not being available.  Caller demonstrated her understanding and will follow up with the appointment.  Symptoms  Reason For Call & Symptoms: Ok today, but last night she experienced her entire face going numb, with breaking out in a cold sweat.  She could not speak-but felt it was she did not want to. Lasted about 20 minutes and then it passed with a migraine following.  Felt that she could have fainted had she not sat down.  No history of diabetes.  Reviewed Health History In EMR: Yes  Reviewed Medications In EMR: Yes  Reviewed Allergies In EMR: Yes  Reviewed Surgeries / Procedures: Yes  Date of Onset of Symptoms: 12/01/2012 OB / GYN:  LMP: Unknown  Guideline(s) Used:  Neurologic Deficit  Disposition Per Guideline:   Go to Office Now  Reason For Disposition Reached:   Neurologic deficit that was transient (now gone), ANY of the following:   Weakness of the face, arm, or leg on one side of the body  Numbness of the face, arm, or leg on one side of the body  Loss of speech or garbled speech  Advice Given:  Call Back If:  Symptoms do not go away within 30 minutes  You become worse.  Patient Will Follow Care Advice:  YES  Appointment Scheduled:  12/02/2012 15:45:00 Appointment Scheduled Provider:  Kriste Basque [facility])

## 2012-12-10 ENCOUNTER — Ambulatory Visit: Payer: BC Managed Care – PPO | Admitting: Neurology

## 2012-12-10 ENCOUNTER — Ambulatory Visit (INDEPENDENT_AMBULATORY_CARE_PROVIDER_SITE_OTHER): Payer: BC Managed Care – PPO | Admitting: Neurology

## 2012-12-10 ENCOUNTER — Encounter: Payer: Self-pay | Admitting: Neurology

## 2012-12-10 VITALS — BP 106/78 | HR 92 | Temp 98.4°F | Resp 20 | Wt 208.0 lb

## 2012-12-10 DIAGNOSIS — G43901 Migraine, unspecified, not intractable, with status migrainosus: Secondary | ICD-10-CM

## 2012-12-10 NOTE — Patient Instructions (Addendum)
Your MRI is scheduled for Monday Sept. 22nd at 2:45pm.  Please arrive to [facility], first floor admitting by 2:30pm. You enter the hospital off of [address] at entrance [address].  [phone]

## 2012-12-10 NOTE — Progress Notes (Signed)
[facility] Neurology Division Clinic Note - Initial Visit   Date: December 10, 2012   Angelica Diaz MRN: [id number] DOB: 02-28-1981   Dear Dr Selena Batten:  Thank you for your kind referral of Angelica Diaz for consultation of migraines. Although her history is well known to you, please allow [place] to reiterate it for the purpose of our medical record. The patient was accompanied to the clinic by self.   History of Present Illness: Angelica Diaz is a 32 y.o. year-old right-handed Caucasian female with history of anxiety and depression presenting for evaluation of migraines.  Patient reports migraines started around the age of 32.  As a teenager to the age of 32, they would occur once per week, but this has improved since then.  Pain is characterized as throbbing pain all over her head and does not radiate into her arms or neck.  Duration is 1-3 days and frequency is once every 4-6 weeks.  She endorses photophobia, phonophobia, nausea, vomiting, and lack of appetite.  No flashing lights, numbness/tingling, or weakness.  Stress exacerbates symptoms.  Usually, she takes ibuprofen and sleeps, which helps.  Currently, pain is a 3/10 and when severe 10/10.  She takes ibuprofen 4-8 pills about 2-3x per week.  She has previously tried hydrocodone, reglan, and was given a prescription for zomig which she never filled.  On September 3, she was was celebrating her daughter's birthday and her entire face went completely numb.  She had associated slurring of her speech and she broke out in a cold sweat.  This lasted 20 minutes.   She sat down, otherwise she would have passed out.  About an hour later, she had the worst migraine she has ever encountered.  She placed an ice pack on her head, took ibuprofen, and tried to lay down.  Pain slowly resolved by the following afternoon.      On September 10, she went to the emergency department because she still was not feeling back to her baseline and continued  to have a dull holocephalic headache and new word-finding problems. She remembers talking and not being able to get the words out.  She had CT brain, but we do not have the results to review.  She received a headache cocktail, without significant relief and was told to follow-up with neurology.    All of the symptoms are improving and she is feeling almost back to her usual self.   Past Medical History  Diagnosis Date  . Depression   . Headache(784.0)   . Anxiety   . Sleep disorder     Past Surgical History  Procedure Laterality Date  . Mouth surgery       Medications:  Current Outpatient Prescriptions on File Prior to Visit  Medication Sig Dispense Refill  . clonazePAM (KLONOPIN) 0.5 MG tablet 1-2 tabs at bedtime as needed  180 tablet  1  . sertraline (ZOLOFT) 100 MG tablet Take 1 tablet (100 mg total) by mouth daily.  100 tablet  3   No current facility-administered medications on file prior to visit.    Allergies: No Known Allergies  Family History  Problem Relation Age of Onset  . Diabetes Maternal Grandfather   . Migraines Mother   . Migraines Son     Social History: History   Social History  . Marital Status: Single    Spouse Name: N/A    Number of Children: N/A  . Years of Education: N/A   Occupational History  .  Not on file.   Social History Main Topics  . Smoking status: Current Every Day Smoker -- 0.50 packs/day for 18 years  . Smokeless tobacco: Never Used  . Alcohol Use: No     Comment: Occasionally  . Drug Use: No  . Sexual Activity: Not on file   Other Topics Concern  . Not on file   Social History Narrative   Currently sells insurance.   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  She has two healthy children.    Review of Systems:  CONSTITUTIONAL: No fevers, chills, night sweats, or weight loss.   EYES: +visual changes, no eye pain ENT: No hearing changes.  No history of nose bleeds.   RESPIRATORY: No cough, wheezing and shortness of breath.    CARDIOVASCULAR: Negative for chest pain, and palpitations.   GI: Negative for abdominal discomfort, blood in stools or black stools.  No recent change in bowel habits.   GU:  No history of incontinence.   MUSCLOSKELETAL: No history of joint pain or swelling.  No myalgias.   SKIN: Negative for lesions, rash, and itching.   HEMATOLOGY/ONCOLOGY: Negative for prolonged bleeding, bruising easily, and swollen nodes.  No history of cancer.   ENDOCRINE: Negative for cold or heat intolerance, polydipsia or goiter.   PSYCH:  No depression or anxiety symptoms.   NEURO: As Above.   Vital Signs:  BP 106/78  Pulse 92  Temp(Src) 98.4 F (36.9 C)  Resp 20  Wt 208 lb (94.348 kg)  BMI 31.63 kg/m2 Pain Scale: 3 on a scale of 0-10   General Medical Exam:   General:  Well appearing, comfortable.   Eyes/ENT: see cranial nerve examination.   Neck: No masses appreciated.  Full range of motion without tenderness.  No carotid bruits. Respiratory:  Clear to auscultation, good air entry bilaterally.   Cardiac:  Regular rate and rhythm, no murmur.   GI:  Soft, non-tender, non-distended abdomen.  Bowel sounds normal.   Back:  No pain to palpation of spinous processes.   Extremities:  No deformities, edema, or skin discoloration.  Skin:  Skin color, texture, turgor normal. No rashes or lesions.  Neurological Exam: MENTAL STATUS including orientation to time, place, person, recent and remote memory, attention span and concentration, language, and fund of knowledge is normal.  Speech is not dysarthric, no semantic or paraphasic errors.  CRANIAL NERVES: II:  No visual field defects.  Unremarkable fundi.   III-IV-VI: Pupils equal round and reactive to light.  Normal conjugate, extra-ocular eye movements in all directions of gaze.  No nystagmus.  No ptosis bilaterally.  No Horner's.   V:  Normal facial sensation.  Jaw jerk is absent.   VII:  Normal facial symmetry and movements.  No pathologic facial reflexes.   VIII:  Normal hearing and vestibular function.   IX-X:  Normal palatal movement.   XI:  Normal shoulder shrug and head rotation.   XII:  Normal tongue strength and range of motion, no deviation or fasciculation.  MOTOR:  No atrophy, fasciculations or abnormal movements.  No pronator drift.  Tone is normal.    Right Upper Extremity:    Left Upper Extremity:    Deltoid  5/5   Deltoid  5/5   Biceps  5/5   Biceps  5/5   Triceps  5/5   Triceps  5/5   Wrist extensors  5/5   Wrist extensors  5/5   Wrist flexors  5/5   Wrist flexors  5/5   Finger  extensors  5/5   Finger extensors  5/5   Finger flexors  5/5   Finger flexors  5/5   Dorsal interossei  5-/5   Dorsal interossei  4+/5   Abductor pollicis  5/5   Abductor pollicis  5/5   Tone (Ashworth scale)  0  Tone (Ashworth scale)  0   Right Lower Extremity:    Left Lower Extremity:    Hip flexors  5/5   Hip flexors  5/5   Hip extensors  5/5   Hip extensors  5/5   Knee flexors  5/5   Knee flexors  5/5   Knee extensors  5/5   Knee extensors  5/5   Dorsiflexors  5/5   Dorsiflexors  5/5   Plantarflexors  5/5   Plantarflexors  5/5   Toe extensors  5/5   Toe extensors  5/5   Toe flexors  5/5   Toe flexors  5/5   Tone (Ashworth scale)  0  Tone (Ashworth scale)  0   MSRs:  Right                                                                 Left brachioradialis 2+  brachioradialis 2+  biceps 2+  biceps 2+  triceps 2+  triceps 2+  patellar 3+  patellar 3+  ankle jerk 2+  ankle jerk 2+  Hoffman yes  Hoffman yes  plantar response down  plantar response down  Crossed adductors present.  1-2 beat ankle clonus.  SENSORY:  Normal and symmetric perception of light touch, pinprick, vibration, and proprioception.  Romberg's sign absent.  No extinction on double simultaneous stimulation.    COORDINATION/GAIT: Normal finger-to- nose-finger and heel-to-shin.  Intact rapid alternating movements bilaterally.  Able to rise from a chair without using arms.   Gait narrow based and stable, stressed gait is intact, but she is unsteady with tandem gait.    Data: No records  IMPRESSION: Angelica Diaz is a pleasant 32 year old female who has a known history of migraines presenting with a new holocephalic headache associated with transient neurological symptoms (numbness of face, word-finding difficulties).  Her exam today is non-focal and does not reveal any particular deficits.  There is no Horner's on exam. Her reflexes are brisk throughout, but do not appear pathologic given the absence of other upper motor neuron findings and maybe normal for her age.  I am concerned about the abrupt change in quality and character of her headache, so will order MRI brain and vessel imaging to evaluate for ischemic changes and/or vascular abnormalities such as a dissection.    If this does not disclose any etiology, it is possible that her migraines are transitioning into migraine with aura.  Also, based on the amount of abortive medications she is using, she may have medication overuse headaches.  I have discussed the pathophysiology and treatment options, but would like to wait for the results of her imaging prior to starting preventative therapy.   PLAN/RECOMMENDATIONS:  1.  MRI brain  2.  MRA circle or willis and carotids  3.  Telephone update with results 4.  Return to clinic in 86-month or sooner as needed  The duration of this appointment visit was 60 minutes of face-to-face time with the  patient.  Greater than 50% of this time was spent in counseling, explanation of diagnosis, planning of further management, and coordination of care.   Thank you for allowing me to participate in patient's care.  If I can answer any additional questions, I would be pleased to do so.    Sincerely,    Kamilya Wakeman K. Posey Pronto, DO

## 2012-12-13 ENCOUNTER — Telehealth: Payer: Self-pay | Admitting: Family Medicine

## 2012-12-13 NOTE — Telephone Encounter (Signed)
Suggest patient take an additional Klonopin one hour prior to procedure

## 2012-12-13 NOTE — Telephone Encounter (Signed)
Angelica Diaz/Patient [phone] neurologist suggested she request an Rx for multiple brain MRIs (with and without contrast) and angiogram of carotid arteries (with and without contrast) ordered by Dr Allena Katz for 12/20/12. Seen in ED on 12/08/12; diagnosed with a brain tumor.  Saw neurologist on 12/10/12.  Currently takes Zoloft and Klonopin daily; taking more Klonopin than usual due to anxiety over diagnosis. Concerned about lying still for 2-3 hour procedure and possible uncomfortable reaction to contrast dyes.  Please call to advise if something can be ordered for day of procedures. [facility]/[place].

## 2012-12-14 ENCOUNTER — Telehealth: Payer: Self-pay | Admitting: Neurology

## 2012-12-14 NOTE — Telephone Encounter (Signed)
Patient states that Klonopin will not help.  She did ask Dr Beryl Meager but was told to call her PCP.  Appointment made with Dr Kirtland Bouchard.

## 2012-12-14 NOTE — Telephone Encounter (Signed)
Pt called Dr. Tawanna Cooler, Dr. Nelida Meuse wife passed away. He is out of the office until 12/29/12, his office is unable to prescribe anything for anxiety. Pt calling b/c she needs meds to relax her (for anxiety) during MRI. Please call pt / Sherri

## 2012-12-15 ENCOUNTER — Ambulatory Visit: Payer: Self-pay | Admitting: Internal Medicine

## 2012-12-15 MED ORDER — DIAZEPAM 5 MG PO TABS: ORAL_TABLET | ORAL | Status: DC

## 2012-12-15 NOTE — Telephone Encounter (Signed)
Patient to use valium 5mg  tablet 30 min prior to MRI for anxiety.

## 2012-12-15 NOTE — Telephone Encounter (Signed)
Pt aware and will come to pick up rx this week.

## 2012-12-20 ENCOUNTER — Ambulatory Visit (HOSPITAL_COMMUNITY)
Admission: RE | Admit: 2012-12-20 | Discharge: 2012-12-20 | Disposition: A | Discharge status: Home / Self Care | Payer: Self-pay | Source: Ambulatory Visit | Attending: Neurology | Admitting: Neurology

## 2012-12-20 ENCOUNTER — Ambulatory Visit (HOSPITAL_COMMUNITY): Payer: Self-pay

## 2012-12-20 DIAGNOSIS — G9389 Other specified disorders of brain: Secondary | ICD-10-CM | POA: Insufficient documentation

## 2012-12-20 DIAGNOSIS — G43909 Migraine, unspecified, not intractable, without status migrainosus: Secondary | ICD-10-CM | POA: Insufficient documentation

## 2012-12-20 DIAGNOSIS — G43901 Migraine, unspecified, not intractable, with status migrainosus: Secondary | ICD-10-CM

## 2012-12-20 MED ORDER — GADOBENATE DIMEGLUMINE 529 MG/ML IV SOLN
20.0000 mL | Freq: Once | INTRAVENOUS | Status: AC | PRN
  Administered 2012-12-20: 20 mL via INTRAVENOUS

## 2012-12-24 ENCOUNTER — Ambulatory Visit (INDEPENDENT_AMBULATORY_CARE_PROVIDER_SITE_OTHER): Payer: Self-pay | Admitting: Neurology

## 2012-12-24 ENCOUNTER — Encounter: Payer: Self-pay | Admitting: Neurology

## 2012-12-24 VITALS — BP 110/70 | HR 96 | Temp 98.3°F | Resp 20 | Ht 67.5 in | Wt 208.0 lb

## 2012-12-24 DIAGNOSIS — G43909 Migraine, unspecified, not intractable, without status migrainosus: Secondary | ICD-10-CM

## 2012-12-24 MED ORDER — KETOROLAC TROMETHAMINE 30 MG/ML IJ SOLN
30.0000 mg | Freq: Once | INTRAMUSCULAR | Status: AC
  Administered 2012-12-24: 30 mg via INTRAMUSCULAR

## 2012-12-24 MED ORDER — PROPRANOLOL HCL 20 MG PO TABS: ORAL_TABLET | ORAL | Status: DC

## 2012-12-24 MED ORDER — FLURBIPROFEN 50 MG PO TABS: 50.0000 mg | ORAL_TABLET | Freq: Two times a day (BID) | ORAL | Status: DC

## 2012-12-24 NOTE — Progress Notes (Signed)
Angelica Diaz  Follow-up Visit   Date: 12/24/2012    Angelica Diaz MRN: [id number] DOB: 08/22/1980   Interim History: Angelica Diaz is a 32 y.o. year-old right-handed Caucasian female with history of anxiety and depression returning to the clinic for follow-up of migraines.  The patient was accompanied to the clinic by self.  She was last seen on the clinic on 12/10/2012. At her last visit, we ordered MRI/A brain and carotids which did not show any abnormalities, except a small 7mm x 8mm x 5mm left frontal meningioma without mass effect.  Extra and intracranial vessels were patent.  Images were reviewed with the patient.   Since then, she continues to have migraines daily.  Pain is described as a dull ache, over the entire head, worse on the left side.  She took aspirin and benadryl once for the headache which helped.  With the severe headaches, she has nasuea, vomiting, photophobia, and phonophobia.  There is mild numbess/tingling on the left side the arm and leg, rarely over the left face.  Word-finding problems are improving, but she feels she is forgetting things easily.  She continues to go work and has not missed any days of work.  Pain is 4/10 currently, when it is severe is 10+ which has occurred 3 times since September 3rd.  There was one episode where she had a severe migraine at work and she walked to the kitchen and found herself on the floor.  Her colleagues said that she was sleeping and probably there for about 45 minutes, but she does not recall the event.  There was no tongue biting, bowel/bladder incontinence, or abnormal movements.  When she woke up, she felt fine and the headache was totally resolved.  She has not had any additional spells like this.   History of present illness: Patient reports migraines started around the age of 32. As a teenager to the age of 35, they would occur once per week, but this has improved since then. Pain is  characterized as throbbing pain all over her head and does not radiate into her arms or neck. Duration is 1-3 days and frequency is once every 4-6 weeks. She endorses photophobia, phonophobia, nausea, vomiting, and lack of appetite. No flashing lights, numbness/tingling, or weakness. Stress exacerbates symptoms. Usually, she takes ibuprofen and sleeps, which helps. Currently, pain is a 3/10 and when severe 10/10. She takes ibuprofen 4-8 pills about 2-3x per week. She has previously tried hydrocodone, reglan, and was given a prescription for zomig which she never filled.   On September 3, she was was celebrating her daughter's birthday and her entire face went completely numb. She had associated slurring of her speech and she broke out in a cold sweat. This lasted 20 minutes. She sat down, otherwise she would have passed out. About an hour later, she had the worst migraine she has ever encountered. She placed an ice pack on her head, took ibuprofen, and tried to lay down. Pain slowly resolved by the following afternoon. On September 10, she went to the emergency department because she still was not feeling back to her baseline and continued to have a dull holocephalic headache and new word-finding problems. She remembers talking and not being able to get the words out. She had CT brain, but we do not have the results to review. She received a headache cocktail, without significant relief.    Medications:  Current Outpatient Prescriptions on File Prior to Visit  Medication  Sig Dispense Refill  . clonazePAM (KLONOPIN) 0.5 MG tablet 1-2 tabs at bedtime as needed  180 tablet  1   No current facility-administered medications on file prior to visit.    Allergies: No Known Allergies   Review of Systems:  CONSTITUTIONAL: No fevers, chills, night sweats, or weight loss.  + memory problems EYES: No visual changes or eye pain ENT: No hearing changes.  No history of nose bleeds.   RESPIRATORY: No cough,  wheezing and shortness of breath.   CARDIOVASCULAR: Negative for chest pain, and palpitations.   GI: Negative for abdominal discomfort, blood in stools or black stools.  No recent change in bowel habits.   GU:  No history of incontinence.   MUSCLOSKELETAL: No history of joint pain or swelling.  No myalgias.   SKIN: Negative for lesions, rash, and itching.   HEMATOLOGY/ONCOLOGY: Negative for prolonged bleeding, bruising easily, and swollen nodes.  No history of cancer.   ENDOCRINE: Negative for cold or heat intolerance, polydipsia or goiter.   PSYCH:  No depression or anxiety symptoms.   NEURO: As Above.   Vital Signs:  BP 110/70  Pulse 96  Temp(Src) 98.3 F (36.8 C)  Resp 20  Ht 5' 7.5" (1.715 m)  Wt 208 lb (94.348 kg)  BMI 32.08 kg/m2 Pain Scale: 4 on a scale of 0-10    Neurological Exam: MENTAL STATUS including orientation to time, place, person, recent and remote memory, attention span and concentration, language, and fund of knowledge is normal.  Speech is not dysarthric.  CRANIAL NERVES: II:  No visual field defects.   III-IV-VI: Pupils equal round and reactive to light.  Normal conjugate, extra-ocular eye movements in all directions of gaze.  No nystagmus.  No ptosis  V:  Normal facial sensation.   VII:  Normal facial symmetry and movements.  VIII:  Normal hearing and vestibular function.   IX-X:  Normal palatal movement.   XI:  Normal shoulder shrug and head rotation.   XII:  Normal tongue strength and range of motion, no deviation or fasciculation.  MOTOR:  Motor strength is 5/5 in all four extremities. No atrophy, fasciculations or abnormal movements.  No pronator drift.  Tone is normal.  Increased muscle tension over the cervical region.  COORDINATION/GAIT: Gait narrow based and stable. Tandem and stressed gait intact.   Data: MRI brain 12/20/2012: Subcentimeter left posterior frontal extra-axial mass lesion, likely incidental meningioma. Otherwise negative  exam. MRA circle of willis and carotids 12/20/2012:  Patent vasculature   IMPRESSION: Angelica Diaz is a 32 year-old female presenting for evaluation of migraines.  Exam remains non-focal.  MRI/A brain and carotids shows left frontal meningioma and is otherwise unremarkable.  She continues to have a daily headache which seems to be responsive NSAIDs.  Because she was taking too much abortive medication at her last visit, I discussed to reduce the frequency which she has complied with.  Given no improvement in symptoms and because she has a daily headache, I will start her on preventative medication.  I discussed options of TCAs, AEDs, beta-blockers, and calcium channel blockers.  She is somewhat concerned about serotonin syndrome associated with Zoloft, so I will start her on propranolol.  Her baseline heart rates stays in the 90s (today it was 96), so I think this would be an appropriate agent.  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  No history of cardiac disease.  As an abortive agent, I will start Ansaid in  hopes that it is longer acting.  Going forward, I may add a muscle relaxant and/or consider a steroid taper if symptoms persist.  Risks and benefits of all medications discussed.   PLAN/RECOMMENDATIONS:  1.  Toradol 30mg  IM today 2.  Start propranolol as follows:  Week 1:  One tablet (20mg ) daily  Week 2:  One tablet (20mg ) morning and evening, and continue thereafter - will titrate further as needed 3.  Take Ansaid 50mg  tablets twice daily as needed for breakthrough pain, do not take more than twice per week.   4.  Warm compresses to neck  5.  List of headache supplements provided for review 6.  Patient to call with clinical update in 2 weeks 7.  Return to clinic in 52-month  The duration of this appointment visit was 40 minutes of face-to-face time with the patient.  Greater than 50% of this time was spent in counseling, explanation of diagnosis, planning of further  management, and coordination of care.   Thank you for allowing me to participate in patient's care.  If I can answer any additional questions, I would be pleased to do so.    Sincerely,    Donika K. Allena Katz, DO

## 2012-12-24 NOTE — Patient Instructions (Addendum)
1.  Toradol 30mg  IM today 2.  Start propranolol as follows:  Week 1:  One tablet (20mg ) daily  Week 2:  One tablet (20mg ) morning and evening, and continue thereafter 3.  Take Ansaid 50mg  tablets twice daily as needed for breakthrough pain, do not take more than twice per week 4.  Warm compresses to neck as needed 5.  List of headache supplements provided for review 6.  Please call with update in 2 weeks 7.  Return to clinic in 15-month

## 2012-12-28 ENCOUNTER — Telehealth: Payer: Self-pay

## 2012-12-28 MED ORDER — TIZANIDINE HCL 2 MG PO TABS: ORAL_TABLET | ORAL | Status: DC

## 2012-12-28 NOTE — Telephone Encounter (Signed)
Pt calling because she has changed her mind on the muscle relaxer you asked her about.  She would like to have one now.

## 2012-12-28 NOTE — Telephone Encounter (Signed)
Returned call to patient.  She reports being on propranolol for 3 days and has been tolerating the medication well and headahes have improved slightly.  She complains of increased muscle tension so will start tizanidine 2mg  at bedtime as needed.  Claus Silvestro K. Allena Katz, DO

## 2013-01-06 ENCOUNTER — Ambulatory Visit (INDEPENDENT_AMBULATORY_CARE_PROVIDER_SITE_OTHER): Payer: Self-pay | Admitting: Family Medicine

## 2013-01-06 ENCOUNTER — Encounter: Payer: Self-pay | Admitting: Family Medicine

## 2013-01-06 VITALS — BP 124/80 | Temp 98.4°F | Wt 212.0 lb

## 2013-01-06 DIAGNOSIS — Z720 Tobacco use: Secondary | ICD-10-CM

## 2013-01-06 DIAGNOSIS — G479 Sleep disorder, unspecified: Secondary | ICD-10-CM

## 2013-01-06 DIAGNOSIS — D320 Benign neoplasm of cerebral meninges: Secondary | ICD-10-CM

## 2013-01-06 DIAGNOSIS — D329 Benign neoplasm of meninges, unspecified: Secondary | ICD-10-CM | POA: Insufficient documentation

## 2013-01-06 DIAGNOSIS — F17200 Nicotine dependence, unspecified, uncomplicated: Secondary | ICD-10-CM

## 2013-01-06 MED ORDER — VARENICLINE TARTRATE 1 MG PO TABS: ORAL_TABLET | ORAL | Status: DC

## 2013-01-06 MED ORDER — LORAZEPAM 1 MG PO TABS: ORAL_TABLET | ORAL | Status: DC

## 2013-01-06 NOTE — Progress Notes (Signed)
  Subjective:    Patient ID: Angelica Diaz, female    DOB: 1980-09-18, 32 y.o.   MRN: [id number]  HPI  Angelica Diaz is a 32 year old female who comes in today for evaluation of sleep dysfunction  She's had a long-standing history of sleep dysfunction we tried different medications including Elavil. She's taken antidepressant that doesn't seem to help. Her sleep dysfunction his of 2 types she will can't go to sleep when she goes to bed when she goes to sleep and she would wake up.  She has a history of migraine headaches and recently had a bad migraine with some left facial numbness. She had one emergency room was found to have an 8 mm meningioma. She's due to see the neurosurgeon [place] for further evaluation  Review of Systems    review of systems otherwise negative as such is a persistent smoker declines a smoking cessation program Objective:   Physical Exam  Well-developed well-nourished female no acute distress vital signs stable she is afebrile      Assessment & Plan:  Sleep dysfunction trial of Ativan may refer to [facility] for further evaluation of sleep dysfunction process

## 2013-01-06 NOTE — Patient Instructions (Addendum)
Ativan 1 mg,,,,,,,,,, one half tab each bedtime when necessary for sleep if after one week and not sleeping well and take a full tablet at bedtime  Consult with your neurologist for followup on this issue also  The best person for sleep dysfunction his Angelica Diaz she  Chantix 1 mg......... one half tablet Monday Wednesday Friday for 3 weeks then one half tablet in the morning

## 2013-01-11 ENCOUNTER — Encounter: Payer: Self-pay | Admitting: Neurology

## 2013-01-17 ENCOUNTER — Encounter: Payer: Self-pay | Admitting: Neurology

## 2013-01-17 ENCOUNTER — Ambulatory Visit (INDEPENDENT_AMBULATORY_CARE_PROVIDER_SITE_OTHER): Payer: MEDICAID | Admitting: Neurology

## 2013-01-17 VITALS — BP 120/86 | HR 65 | Temp 98.6°F | Resp 14 | Ht 67.0 in | Wt 207.9 lb

## 2013-01-17 DIAGNOSIS — F329 Major depressive disorder, single episode, unspecified: Secondary | ICD-10-CM

## 2013-01-17 DIAGNOSIS — G4440 Drug-induced headache, not elsewhere classified, not intractable: Secondary | ICD-10-CM

## 2013-01-17 DIAGNOSIS — R51 Headache: Secondary | ICD-10-CM

## 2013-01-17 MED ORDER — SUMATRIPTAN SUCCINATE 50 MG PO TABS: 50.0000 mg | ORAL_TABLET | Freq: Once | ORAL | Status: DC | PRN

## 2013-01-17 MED ORDER — TOPIRAMATE 25 MG PO TABS: ORAL_TABLET | ORAL | Status: DC

## 2013-01-17 NOTE — Progress Notes (Signed)
[facility]  Follow-up Visit   Date: 01/17/2013    Angelica Diaz MRN: [id number] DOB: [birth year]   Interim History:e Angelica Diaz is a 32 y.o. year-old right-handed Caucasian female with history of anxiety and depression returning to the clinic for follow-up of migraines.  The patient was accompanied to the clinic by self.  She was last seen on the clinic on 12/24/2012.   She took propranolol 20mg  twice daily, but did not notice a change in her pain and started developing  vivid dreams and urinating more.  She does get relief with Ansaid and is taking it 1-2 twice per week.  She is not taking ibuprofen or tylenol.  She continues to have migraines daily, with pain over the entire headache (L >R).  She is concerned about short-term memory loss because it has not improved.  With the severe headaches, she has nasuea, vomiting, photophobia, and phonophobia.  She has missed 2 days in the past two weeks.    History of present illness: Intial visit 12/10/2012:  Patient reports migraines started around the age of 32. As a teenager to the age of 38, they would occur once per week, but this has improved since then. Pain is characterized as throbbing pain all over her head and does not radiate into her arms or neck. Duration is 1-3 days and frequency is once every 4-6 weeks. She endorses photophobia, phonophobia, nausea, vomiting, and lack of appetite. No flashing lights, numbness/tingling, or weakness. Stress exacerbates symptoms. Usually, she takes ibuprofen and sleeps, which helps. Currently, pain is a 3/10 and when severe 10/10. She takes ibuprofen 4-8 pills about 2-3x per week. She has previously tried hydrocodone, reglan, and was given a prescription for zomig which she never filled.   On September 3, she was was celebrating her daughter's birthday and her entire face went completely numb. She had associated slurring of her speech and she broke out in a cold sweat. This  lasted 20 minutes. She sat down, otherwise she would have passed out. About an hour later, she had the worst migraine she has ever encountered. She placed an ice pack on her head, took ibuprofen, and tried to lay down. Pain slowly resolved by the following afternoon. On September 10, she went to the emergency department because she still was not feeling back to her baseline and continued to have a dull holocephalic headache and new word-finding problems. She remembers talking and not being able to get the words out. She had CT brain, but we do not have the results to review. She received a headache cocktail, without significant relief.  1st follow-up 12/24/2012:  Started propranolol and ansaid.  MRI/A brain and carotids which did not show any abnormalities, except a small 7mm x 8mm x 5mm left frontal meningioma without mass effect. Extra and intracranial vessels were patent.    Medications:  Current Outpatient Prescriptions on File Prior to Visit  Medication Sig Dispense Refill  . flurbiprofen (ANSAID) 50 MG tablet Take 1 tablet (50 mg total) by mouth 2 (two) times daily. Take 1 tablet twice daily as needed, no more than twice per week.  20 tablet  0  . sertraline (ZOLOFT) 100 MG tablet Take 50 mg by mouth daily.      . varenicline (CHANTIX CONTINUING MONTH PAK) 1 MG tablet Uses directed  30 tablet  2   No current facility-administered medications on file prior to visit.    Allergies: No Known Allergies   Review of  Systems:  CONSTITUTIONAL: No fevers, chills, night sweats, or weight loss.  + memory problems EYES: No visual changes or eye pain ENT: No hearing changes.  No history of nose bleeds.   RESPIRATORY: No cough, wheezing and shortness of breath.   CARDIOVASCULAR: Negative for chest pain, and palpitations.   GI: Negative for abdominal discomfort, blood in stools or black stools.  No recent change in bowel habits.   GU:  No history of incontinence.   MUSCLOSKELETAL: No history of joint  pain or swelling.  No myalgias.   SKIN: Negative for lesions, rash, and itching.   HEMATOLOGY/ONCOLOGY: Negative for prolonged bleeding, bruising easily, and swollen nodes.   ENDOCRINE: Negative for cold or heat intolerance, polydipsia or goiter.   PSYCH:  +depression or anxiety symptoms.   NEURO: As Above.   Vital Signs:  BP 120/86  Pulse 65  Temp(Src) 98.6 F (37 C)  Resp 14  Ht 5\' 7"  (1.702 m)  Wt 207 lb 14.4 oz (94.303 kg)  BMI 32.55 kg/m2 Pain Scale: 4 on a scale of 0-10    Neurological Exam: Short term memory slightly impaired.  Awake, alert, oriented x 3. Speech is not dysarthric.  CRANIAL NERVES: III-IV-VI: Pupils equal round and reactive to light.  Normal conjugate, extra-ocular eye movements in all directions of gaze.  No nystagmus.  No ptosis  VII:  Normal facial symmetry and movements.   MOTOR:  Motor strength is 5/5 in all four extremities.   COORDINATION/GAIT: Gait narrow based and stable. Tandem and stressed gait intact.   Data: MRI brain 12/20/2012: Subcentimeter left posterior frontal extra-axial mass lesion, likely incidental meningioma. Otherwise negative exam. MRA circle of willis and carotids 12/20/2012:  Patent vasculature   IMPRESSION: 1. 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 MRI brain with subcentimeter left posterior frontal extra-axial mass lesion, likely incidental meningioma. Otherwise negative exam. Patent intra- and extra-cranial vessels.  - Previously tried propranolol (frequent urination, vivid dreams - not given fair trial due to intolerance), amitriptyline  - Start topiramate as noted below.  No plans for pregnancy, fiance has vasectomy.  Risks and benefits of TPM discussed. 2. Medication overuse headaches   - Discussed need to try to minimize frequency of PRN medications   - Start Ansaid for longer effect and triptan for severe headaches.  Discussed risks and benefits  including serotonin syndrome (very low risk) especially starting as a low dose 3. Depression  - possible short-term memory loss is due to this  - still with depressive symptoms, was referred to see psychiatry by PCP, but not scheduled appointment yet   PLAN/RECOMMENDATIONS:  1. Preventative therapy   Titration schedule for topiramate was provided as follows, risks and benefits discussed:        AM   PM   Week 1:   25mg    Week 2: 25 mg  25mg    Week 3: 25mg   50mg    Week 4: 50mg   50mg   2. Rescue therapy   For severe headaches, take sumatriptan 50mg  PO at onset of severe headaches, if no improvement in 2 hours, may take another pill.   For moderate headaches, take Ansaid 50mg  twice daily as needed.  3. Patient instructed not to take sumatriptan, Ansaid, OR advil more than 9 days a month. 4. Encouraged to see psychiatry for depression 5. Return to clinic in 45-month  The duration of this appointment visit was 40 minutes of face-to-face time with the patient.  Greater than 50% of this time was spent in counseling, explanation of diagnosis, planning of further management, and coordination of care.   Thank you for allowing me to participate in patient's care.  If I can answer any additional questions, I would be pleased to do so.    Sincerely,    Wylie Russon K. Posey Pronto, DO

## 2013-01-17 NOTE — Patient Instructions (Addendum)
1. Preventative therapy   Titration schedule for topiramate was provided as follows, risks and benefits discussed:        AM   PM   Week 1:   25mg    Week 2: 25 mg  25mg    Week 3: 25mg   50mg    Week 4: 50mg   50mg   2. Rescue therapy   For severe headaches, take sumatriptan 50mg  PO at onset of severe headaches, if no improvement in 2 hours, may take another pill.   For moderate headaches, take Ansaid 50mg  twice daily as needed.  3. Patient instructed not to take sumatriptan, Ansaid, OR advil more than 9 days a month. 4. Psychiatry consult 5. Return to clinic in 53-month

## 2013-01-18 ENCOUNTER — Telehealth: Payer: Self-pay | Admitting: Family Medicine

## 2013-01-18 NOTE — Telephone Encounter (Addendum)
Pt would like a refill of HYDROcodone-homatropine (HYDROMET) 5-1.5 MG/5ML syrup Pt states the cough just started today. Advised pt she may need appt.. Filled 2012.

## 2013-01-19 MED ORDER — HYDROCODONE-HOMATROPINE 5-1.5 MG/5ML PO SYRP: ORAL_SOLUTION | ORAL | Status: DC

## 2013-01-19 NOTE — Telephone Encounter (Signed)
Rx ready for pick and patient is aware 

## 2013-01-25 ENCOUNTER — Ambulatory Visit: Payer: Self-pay | Admitting: Neurology

## 2013-02-01 ENCOUNTER — Encounter (HOSPITAL_BASED_OUTPATIENT_CLINIC_OR_DEPARTMENT_OTHER): Payer: Self-pay | Admitting: Emergency Medicine

## 2013-02-01 ENCOUNTER — Emergency Department (HOSPITAL_BASED_OUTPATIENT_CLINIC_OR_DEPARTMENT_OTHER)
Admission: EM | Admit: 2013-02-01 | Discharge: 2013-02-01 | Disposition: A | Discharge status: Home / Self Care | Payer: Self-pay | Attending: Emergency Medicine | Admitting: Emergency Medicine

## 2013-02-01 DIAGNOSIS — Z79899 Other long term (current) drug therapy: Secondary | ICD-10-CM | POA: Insufficient documentation

## 2013-02-01 DIAGNOSIS — M2550 Pain in unspecified joint: Secondary | ICD-10-CM | POA: Insufficient documentation

## 2013-02-01 DIAGNOSIS — F17200 Nicotine dependence, unspecified, uncomplicated: Secondary | ICD-10-CM | POA: Insufficient documentation

## 2013-02-01 DIAGNOSIS — F3289 Other specified depressive episodes: Secondary | ICD-10-CM | POA: Insufficient documentation

## 2013-02-01 DIAGNOSIS — F329 Major depressive disorder, single episode, unspecified: Secondary | ICD-10-CM | POA: Insufficient documentation

## 2013-02-01 DIAGNOSIS — F411 Generalized anxiety disorder: Secondary | ICD-10-CM | POA: Insufficient documentation

## 2013-02-01 LAB — COMPREHENSIVE METABOLIC PANEL WITH GFR
ALT: 19 U/L (ref 0–35)
AST: 17 U/L (ref 0–37)
Albumin: 4.1 g/dL (ref 3.5–5.2)
Alkaline Phosphatase: 90 U/L (ref 39–117)
BUN: 17 mg/dL (ref 6–23)
CO2: 22 meq/L (ref 19–32)
Calcium: 10 mg/dL (ref 8.4–10.5)
Chloride: 103 meq/L (ref 96–112)
Creatinine, Ser: 0.7 mg/dL (ref 0.50–1.10)
GFR calc Af Amer: >90 mL/min
GFR calc non Af Amer: >90 mL/min
Glucose, Bld: 91 mg/dL (ref 70–99)
Potassium: 3.6 meq/L (ref 3.5–5.1)
Sodium: 138 meq/L (ref 135–145)
Total Bilirubin: 0.3 mg/dL (ref 0.3–1.2)
Total Protein: 7.8 g/dL (ref 6.0–8.3)

## 2013-02-01 LAB — CBC WITH DIFFERENTIAL/PLATELET
Basophils Absolute: 0 K/uL (ref 0.0–0.1)
Basophils Relative: 0 % (ref 0–1)
Eosinophils Absolute: 0.1 K/uL (ref 0.0–0.7)
Eosinophils Relative: 1 % (ref 0–5)
HCT: 44.4 % (ref 36.0–46.0)
Hemoglobin: 15 g/dL (ref 12.0–15.0)
Lymphocytes Relative: 34 % (ref 12–46)
Lymphs Abs: 3.4 K/uL (ref 0.7–4.0)
MCH: 30.7 pg (ref 26.0–34.0)
MCHC: 33.8 g/dL (ref 30.0–36.0)
MCV: 90.8 fL (ref 78.0–100.0)
Monocytes Absolute: 0.8 K/uL (ref 0.1–1.0)
Monocytes Relative: 8 % (ref 3–12)
Neutro Abs: 5.7 K/uL (ref 1.7–7.7)
Neutrophils Relative %: 57 % (ref 43–77)
Platelets: 242 K/uL (ref 150–400)
RBC: 4.89 MIL/uL (ref 3.87–5.11)
RDW: 13 % (ref 11.5–15.5)
WBC: 10.1 K/uL (ref 4.0–10.5)

## 2013-02-01 LAB — SEDIMENTATION RATE: Sed Rate: 9 mm/h (ref 0–22)

## 2013-02-01 MED ORDER — PREDNISONE 10 MG PO TABS: 20.0000 mg | ORAL_TABLET | Freq: Two times a day (BID) | ORAL | Status: DC

## 2013-02-01 MED ORDER — PREDNISONE 20 MG PO TABS
20.0000 mg | ORAL_TABLET | Freq: Once | ORAL | Status: AC
  Administered 2013-02-01: 20 mg via ORAL
  Filled 2013-02-01: qty 1

## 2013-02-01 MED ORDER — HYDROCODONE-ACETAMINOPHEN 5-325 MG PO TABS: 2.0000 | ORAL_TABLET | ORAL | Status: DC | PRN

## 2013-02-01 NOTE — ED Notes (Signed)
Pt reports generalized body aches that started a week ago.  States that her bilateral arms, wrists, elbows, legs, knees and ankles and feet hurt.  Reports that it started all of a sudden when she got out of bed.  Ambulatory.

## 2013-02-01 NOTE — ED Provider Notes (Signed)
CSN: [id number]     Arrival date & time 02/01/13  1831 History  This chart was scribed for Geoffery Lyons, MD by Dorothey Baseman, ED Scribe. This patient was seen in room MH03/MH03 and the patient's care was started at 7:07 PM.    Chief Complaint  Patient presents with  . Generalized Body Aches   The history is provided by the patient. No language interpreter was used.   HPI Comments: Angelica Diaz is a 32 y.o. female who presents to the Emergency Department complaining of constant, generalized arthralgias to the bilateral upper and lower extremities with sudden onset 1 week ago when she got out of bed. Patient reports that she has been ambulatory, but that the pains are exacerbated with walking and movement. She states that the areas feel swollen, but do not appear so. Patient reports taking ibuprofen at home a few days ago without relief. 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 She denies fever or recent illness. She denies familial history of similar symptoms. Patient reports that she takes Zoloft and Topamax daily for depression and migraines, respectively.   PCP- Dr. Kelle Darting  Past Medical History  Diagnosis Date  . Depression   . Headache(784.0)   . Anxiety   . Sleep disorder    Past Surgical History  Procedure Laterality Date  . Mouth surgery     Family History  Problem Relation Age of Onset  . Diabetes Maternal Grandfather   . Migraines Mother   . Migraines Son    History  Substance Use Topics  . Smoking status: Current Every Day Smoker -- 0.50 packs/day for 18 years    Types: Cigarettes  . Smokeless tobacco: Never Used  . Alcohol Use: No     Comment: Occasionally   OB History   Grav Para Term Preterm Abortions TAB SAB Ect Mult Living   2 2             Review of Systems  A complete 10 system review of systems was obtained and all systems are negative except as noted in the HPI and PMH.   Allergies  Review of  patient's allergies indicates no known allergies.  Home Medications   Current Outpatient Rx  Name  Route  Sig  Dispense  Refill  . flurbiprofen (ANSAID) 50 MG tablet   Oral   Take 1 tablet (50 mg total) by mouth 2 (two) times daily. Take 1 tablet twice daily as needed, no more than twice per week.   20 tablet   0   . HYDROcodone-homatropine (HYCODAN) 5-1.5 MG/5ML syrup      Half  To one teaspoon at bedtime as needed for sleep   120 mL   0   . sertraline (ZOLOFT) 100 MG tablet   Oral   Take 50 mg by mouth daily.         . SUMAtriptan (IMITREX) 50 MG tablet   Oral   Take 1 tablet (50 mg total) by mouth once as needed for migraine. May repeat in 2 hours if headache persists or recurs.   9 tablet   2   . topiramate (TOPAMAX) 25 MG tablet      Take one tab daily x 1 week, then take one tablet twice daily, then as directed   120 tablet   3   . varenicline (CHANTIX CONTINUING MONTH PAK) 1 MG tablet      Uses directed  30 tablet   2    Triage Vitals: BP 117/90  Pulse 80  Temp(Src) 98.9 F (37.2 C) (Oral)  Resp 18  Ht 5' 7.5" (1.715 m)  Wt 205 lb (92.987 kg)  BMI 31.62 kg/m2  SpO2 100%  Physical Exam  Nursing note and vitals reviewed. Constitutional: She is oriented to person, place, and time. She appears well-developed and well-nourished. No distress.  HENT:  Head: Normocephalic and atraumatic.  Eyes: Conjunctivae are normal.  Neck: Normal range of motion. Neck supple.  Cardiovascular: Normal rate, regular rhythm and normal heart sounds.   Pulmonary/Chest: Effort normal. No respiratory distress.  Abdominal: Soft. Bowel sounds are normal. She exhibits no distension. There is no tenderness.  Musculoskeletal: Normal range of motion.  Both elbows, wrists, knees, and ankles appear grossly normal. There is no effusion, warmth, or swelling. She reports pain with range of motion.   Neurological: She is alert and oriented to person, place, and time.  Skin: Skin is  warm and dry.  Psychiatric: She has a normal mood and affect. Her behavior is normal.    ED Course  Procedures (including critical care time)  Medications  predniSONE (DELTASONE) tablet 20 mg (20 mg Oral Given 02/01/13 1924)    DIAGNOSTIC STUDIES: Oxygen Saturation is 100% on room air, normal by my interpretation.    COORDINATION OF CARE: 7:10 PM- Discussed that symptoms are likely due to an inflammatory process. Will order blood labs and prednisone. Discussed treatment plan with patient at bedside and patient verbalized agreement.     Labs Review Labs Reviewed - No data to display Imaging Review No results found.  EKG Interpretation   None       MDM  No diagnosis found. Patient is a 32 year old female who presents here with complaints of pain in all of the joints of her arms and legs. There is no injury no trauma. Physical examination is unremarkable with the exception of pain with range of motion. There are no joint effusions, warmth, or erythema. She has no fever and laboratory studies are unremarkable. I have ordered an ANA and a sedimentation rate which are both pending at this time. These results will likely not be back until tomorrow at the soonest I have recommended she followup these results with her primary Dr. I will prescribe prednisone and pain medication in the meantime.  I personally performed the services described in this documentation, which was scribed in my presence. The recorded information has been reviewed and is accurate.       Geoffery Lyons, MD 02/01/13 2053

## 2013-02-02 ENCOUNTER — Encounter: Payer: Self-pay | Admitting: Family Medicine

## 2013-02-02 ENCOUNTER — Telehealth: Payer: Self-pay | Admitting: Family Medicine

## 2013-02-02 DIAGNOSIS — M2550 Pain in unspecified joint: Secondary | ICD-10-CM

## 2013-02-02 LAB — ANA: Anti Nuclear Antibody(ANA): NEGATIVE

## 2013-02-02 NOTE — Telephone Encounter (Signed)
Pt would like you to call her concerning her rheumatology appt.

## 2013-02-03 ENCOUNTER — Other Ambulatory Visit: Payer: Self-pay

## 2013-02-03 NOTE — Telephone Encounter (Signed)
Due to issues concerning to patient ... She should see rheumatology per Dr Tawanna Cooler

## 2013-02-03 NOTE — Telephone Encounter (Signed)
Pt following up on request for results. Pt unsure why she needs to go to a rheumatology md.

## 2013-02-03 NOTE — Telephone Encounter (Signed)
Patient would like to know her lab results

## 2013-02-04 NOTE — Telephone Encounter (Signed)
Spoke with patient and she will give Dr Ewell Poe office a call

## 2013-02-09 ENCOUNTER — Encounter: Payer: Self-pay | Admitting: Neurology

## 2013-02-18 ENCOUNTER — Ambulatory Visit: Payer: Self-pay | Admitting: Neurology

## 2013-02-21 ENCOUNTER — Encounter: Payer: Self-pay | Admitting: Family Medicine

## 2013-02-21 DIAGNOSIS — Z720 Tobacco use: Secondary | ICD-10-CM

## 2013-02-21 MED ORDER — VARENICLINE TARTRATE 1 MG PO TABS: ORAL_TABLET | ORAL | Status: DC

## 2013-02-21 NOTE — Telephone Encounter (Signed)
Rx sent to phramacy 

## 2013-03-01 ENCOUNTER — Encounter: Payer: Self-pay | Admitting: Neurology

## 2013-03-01 ENCOUNTER — Encounter: Payer: BC Managed Care – PPO | Admitting: Family Medicine

## 2013-03-01 MED ORDER — PREDNISONE 10 MG PO TABS: ORAL_TABLET | ORAL | Status: DC

## 2013-03-06 ENCOUNTER — Encounter: Payer: Self-pay | Admitting: Family Medicine

## 2013-03-12 ENCOUNTER — Encounter (HOSPITAL_BASED_OUTPATIENT_CLINIC_OR_DEPARTMENT_OTHER): Payer: Self-pay | Admitting: Emergency Medicine

## 2013-03-12 ENCOUNTER — Emergency Department (HOSPITAL_BASED_OUTPATIENT_CLINIC_OR_DEPARTMENT_OTHER)
Admission: EM | Admit: 2013-03-12 | Discharge: 2013-03-12 | Disposition: A | Discharge status: Home / Self Care | Payer: Self-pay | Attending: Emergency Medicine | Admitting: Emergency Medicine

## 2013-03-12 DIAGNOSIS — G43909 Migraine, unspecified, not intractable, without status migrainosus: Secondary | ICD-10-CM | POA: Insufficient documentation

## 2013-03-12 DIAGNOSIS — R05 Cough: Secondary | ICD-10-CM

## 2013-03-12 DIAGNOSIS — G479 Sleep disorder, unspecified: Secondary | ICD-10-CM | POA: Insufficient documentation

## 2013-03-12 DIAGNOSIS — R059 Cough, unspecified: Secondary | ICD-10-CM | POA: Insufficient documentation

## 2013-03-12 DIAGNOSIS — F17200 Nicotine dependence, unspecified, uncomplicated: Secondary | ICD-10-CM | POA: Insufficient documentation

## 2013-03-12 DIAGNOSIS — F329 Major depressive disorder, single episode, unspecified: Secondary | ICD-10-CM | POA: Insufficient documentation

## 2013-03-12 DIAGNOSIS — Z79899 Other long term (current) drug therapy: Secondary | ICD-10-CM | POA: Insufficient documentation

## 2013-03-12 DIAGNOSIS — IMO0002 Reserved for concepts with insufficient information to code with codable children: Secondary | ICD-10-CM | POA: Insufficient documentation

## 2013-03-12 DIAGNOSIS — F411 Generalized anxiety disorder: Secondary | ICD-10-CM | POA: Insufficient documentation

## 2013-03-12 DIAGNOSIS — R0982 Postnasal drip: Secondary | ICD-10-CM | POA: Insufficient documentation

## 2013-03-12 DIAGNOSIS — F3289 Other specified depressive episodes: Secondary | ICD-10-CM | POA: Insufficient documentation

## 2013-03-12 HISTORY — DX: Migraine, unspecified, not intractable, without status migrainosus: G43.909

## 2013-03-12 MED ORDER — HYDROCODONE-HOMATROPINE 5-1.5 MG/5ML PO SYRP: 5.0000 mL | ORAL_SOLUTION | Freq: Four times a day (QID) | ORAL | Status: DC | PRN

## 2013-03-12 MED ORDER — FLUTICASONE PROPIONATE 50 MCG/ACT NA SUSP: 2.0000 | Freq: Every day | NASAL | Status: DC

## 2013-03-12 NOTE — ED Notes (Addendum)
rx x 2 given for hydromet and flonase. D/c home with ride

## 2013-03-12 NOTE — ED Notes (Signed)
Pt reports cough that started today leading to chest soreness denies cardiac hx but admits to hx of bronchitis. Pt feels cough is worsening  And has had same in past and wants tx before symptoms turn into severe bronchitis as in past

## 2013-03-12 NOTE — ED Provider Notes (Signed)
CSN: [id number]     Arrival date & time 03/12/13  2231 History  This chart was scribed for Zyeir Dymek Smitty Cords, MD by Dorothey Baseman, ED Scribe. This patient was seen in room MH11/MH11 and the patient's care was started at 11:10 PM.    Chief Complaint  Patient presents with  . Cough   Patient is a 32 y.o. female presenting with cough. The history is provided by the patient. No language interpreter was used.  Cough Cough characteristics:  Dry Severity:  Moderate Onset quality:  Sudden Duration:  6 hours Timing:  Constant Progression:  Unchanged Chronicity:  Recurrent Smoker: yes   Context: not with activity   Relieved by:  Nothing Worsened by:  Nothing tried Ineffective treatments:  Cough suppressants Associated symptoms: no fever, no rash, no sinus congestion, no sore throat and no wheezing   Risk factors: no recent travel    HPI Comments: Angelica Diaz is a 31 y.o. female who presents to the Emergency Department complaining of a constant, dry cough onset earlier today that has been progressively worsening. She reports taking Delsym at home without relief. She denies congestion, fever, sore throat, diarrhea, or rashes. Patient reports a history of bronchitis and states her current symptoms feel similar. She denies any recent extended travel. Patient has no other pertinent medical history.   Past Medical History  Diagnosis Date  . Depression   . Headache(784.0)   . Anxiety   . Sleep disorder   . Migraine    Past Surgical History  Procedure Laterality Date  . Mouth surgery     Family History  Problem Relation Age of Onset  . Diabetes Maternal Grandfather   . Migraines Mother   . Migraines Son    History  Substance Use Topics  . Smoking status: Current Every Day Smoker -- 0.50 packs/day for 18 years    Types: Cigarettes  . Smokeless tobacco: Never Used  . Alcohol Use: No     Comment: Occasionally   OB History   Grav Para Term Preterm Abortions TAB SAB Ect Mult  Living   2 2             Review of Systems  Constitutional: Negative for fever.  HENT: Negative for congestion and sore throat.   Respiratory: Positive for cough. Negative for wheezing.   Gastrointestinal: Negative for diarrhea.  Skin: Negative for rash.  All other systems reviewed and are negative.    Allergies  Review of patient's allergies indicates no known allergies.  Home Medications   Current Outpatient Rx  Name  Route  Sig  Dispense  Refill  . flurbiprofen (ANSAID) 50 MG tablet   Oral   Take 1 tablet (50 mg total) by mouth 2 (two) times daily. Take 1 tablet twice daily as needed, no more than twice per week.   20 tablet   0   . HYDROcodone-acetaminophen (NORCO) 5-325 MG per tablet   Oral   Take 2 tablets by mouth every 4 (four) hours as needed.   12 tablet   0   . HYDROcodone-homatropine (HYCODAN) 5-1.5 MG/5ML syrup      Half  To one teaspoon at bedtime as needed for sleep   120 mL   0   . predniSONE (DELTASONE) 10 MG tablet      Take as follows: 6 tab, 5 tabs, 4 tabs, 3 tabs, 2 tabs, 1 tab, then one-half tablet, then STOP.   25 tablet   0   . sertraline (  ZOLOFT) 100 MG tablet   Oral   Take 50 mg by mouth daily.         . SUMAtriptan (IMITREX) 50 MG tablet   Oral   Take 1 tablet (50 mg total) by mouth once as needed for migraine. May repeat in 2 hours if headache persists or recurs.   9 tablet   2   . topiramate (TOPAMAX) 25 MG tablet      Take one tab daily x 1 week, then take one tablet twice daily, then as directed   120 tablet   3   . varenicline (CHANTIX CONTINUING MONTH PAK) 1 MG tablet      Uses directed   30 tablet   2    Triage Vitals: BP 113/81  Pulse 94  Temp(Src) 98.6 F (37 C) (Oral)  Resp 16  Wt 203 lb (92.08 kg)  SpO2 100%  Physical Exam  Nursing note and vitals reviewed. Constitutional: She is oriented to person, place, and time. She appears well-developed and well-nourished. No distress.  HENT:  Head:  Normocephalic and atraumatic.  Mouth/Throat: No oropharyngeal exudate.  Postnasal drip, cobblestoning  Eyes: Conjunctivae are normal. Pupils are equal, round, and reactive to light.  Neck: Normal range of motion. Neck supple.  Cardiovascular: Normal rate, regular rhythm and normal heart sounds.  Exam reveals no gallop and no friction rub.   No murmur heard. Pulmonary/Chest: Effort normal and breath sounds normal. No stridor. No respiratory distress. She has no wheezes. She has no rales.  Abdominal: Soft. Bowel sounds are normal. She exhibits no distension. There is no tenderness.  Musculoskeletal: Normal range of motion.  Neurological: She is alert and oriented to person, place, and time. She has normal reflexes.  Skin: Skin is warm and dry.  Psychiatric: She has a normal mood and affect. Her behavior is normal.    ED Course  Procedures (including critical care time)  DIAGNOSTIC STUDIES: Oxygen Saturation is 100% on room air, normal by my interpretation.    COORDINATION OF CARE: 11:15 PM- Will discharge patient with Hydromet and Flonase to manage symptoms. Discussed treatment plan with patient at bedside and patient verbalized agreement.     Labs Review Labs Reviewed - No data to display Imaging Review No results found.  EKG Interpretation   None       MDM  No diagnosis found. Post nasal drip.  Patient states she needs hydromet cough syrup.  Will prescribe flonase and short course of cough syrup  I personally performed the services described in this documentation, which was scribed in my presence. The recorded information has been reviewed and is accurate.      Amal Saiki Smitty Cords, MD 03/13/13 0001

## 2013-03-18 ENCOUNTER — Encounter: Payer: Self-pay | Admitting: Physician Assistant

## 2013-03-18 ENCOUNTER — Ambulatory Visit (INDEPENDENT_AMBULATORY_CARE_PROVIDER_SITE_OTHER): Payer: Self-pay | Admitting: Physician Assistant

## 2013-03-18 VITALS — BP 118/78 | HR 76 | Temp 98.3°F | Resp 16 | Ht 67.5 in | Wt 207.0 lb

## 2013-03-18 DIAGNOSIS — J45909 Unspecified asthma, uncomplicated: Secondary | ICD-10-CM

## 2013-03-18 MED ORDER — AZITHROMYCIN 250 MG PO TABS: ORAL_TABLET | ORAL | Status: DC

## 2013-03-18 MED ORDER — ALBUTEROL SULFATE HFA 108 (90 BASE) MCG/ACT IN AERS: 1.0000 | INHALATION_SPRAY | Freq: Four times a day (QID) | RESPIRATORY_TRACT | Status: DC | PRN

## 2013-03-18 MED ORDER — METHYLPREDNISOLONE (PAK) 4 MG PO TABS: ORAL_TABLET | ORAL | Status: DC

## 2013-03-18 NOTE — Patient Instructions (Signed)
Take antibiotic and steroid pack as prescribed.  Increase fluid intake.  Saline nasal spray.  Rest.  Place a humidifier in the bedroom.  Albuterol as prescribed if needed for wheeze.  Please call or return to clinic if symptoms not improving.

## 2013-03-18 NOTE — Progress Notes (Signed)
Pre visit review using our clinic review tool, if applicable. No additional management support is needed unless otherwise documented below in the visit note/SLS  

## 2013-03-20 DIAGNOSIS — J45909 Unspecified asthma, uncomplicated: Secondary | ICD-10-CM | POA: Insufficient documentation

## 2013-03-20 NOTE — Progress Notes (Signed)
Patient ID: Angelica Diaz, female   DOB: [birth year], 32 y.o.   MRN: [id number]  Patient presents to clinic today complaining of 6 days a call, chest tightness, chest congestion and shortness of breath. Patient endorses cough is productive of green sputum. Endorses transient fever. Denies sinus pressure or pain. Denies recent travel or sick contact. Was [facility] about 4 hours after symptom onset, was diagnosed with cough. Patient does have history of asthma. Has been using albuterol inhaler.   Past Medical History  Diagnosis Date  . Depression   . Headache(784.0)   . Anxiety   . Sleep disorder   . Migraine     Current Outpatient Prescriptions on File Prior to Visit  Medication Sig Dispense Refill  . flurbiprofen (ANSAID) 50 MG tablet Take 1 tablet (50 mg total) by mouth 2 (two) times daily. Take 1 tablet twice daily as needed, no more than twice per week.  20 tablet  0  . sertraline (ZOLOFT) 100 MG tablet Take 50 mg by mouth daily.      . SUMAtriptan (IMITREX) 50 MG tablet Take 1 tablet (50 mg total) by mouth once as needed for migraine. May repeat in 2 hours if headache persists or recurs.  9 tablet  2  . topiramate (TOPAMAX) 25 MG tablet Take one tab daily x 1 week, then take one tablet twice daily, then as directed  120 tablet  3   No current facility-administered medications on file prior to visit.    No Known Allergies  Family History  Problem Relation Age of Onset  . Diabetes Maternal Grandfather   . Migraines Mother   . Migraines Son     History   Social History  . Marital Status: Single    Spouse Name: N/A    Number of Children: N/A  . Years of Education: N/A   Social History Main Topics  . Smoking status: Former Smoker -- 0.50 packs/day for 18 years    Types: Cigarettes    Quit date: 03/04/2013  . Smokeless tobacco: Never Used  . Alcohol Use: No     Comment: Occasionally  . Drug Use: No  . Sexual Activity: None   Other Topics Concern  .  None   Social History Narrative   Currently sells insurance.   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  She has two healthy children.    Review of Systems - see history of present illness. All other review of systems are negative.   Filed Vitals:   03/18/13 1037  BP: 118/78  Pulse: 76  Temp: 98.3 F (36.8 C)  Resp: 16   Physical Exam  Vitals reviewed. Constitutional: She is oriented to person, place, and time and well-developed, well-nourished, and in no distress.  HENT:  Head: Normocephalic and atraumatic.  Right Ear: External ear normal.  Left Ear: External ear normal.  Nose: Nose normal.  Mouth/Throat: Oropharynx is clear and moist. No oropharyngeal exudate.  Tympanic membranes within normal limits bilaterally. No tenderness to percussion of sinuses noted.  Neck: Neck supple.  Cardiovascular: Normal rate, regular rhythm, normal heart sounds and intact distal pulses.   Pulmonary/Chest: Effort normal. No respiratory distress. She has wheezes. She has no rales. She exhibits no tenderness.  Lymphadenopathy:    She has no cervical adenopathy.  Neurological: She is alert and oriented to person, place, and time.  Skin: Skin is warm and dry. No rash noted.  Psychiatric: Affect normal.     Recent Results (from the past 2160 hour(s))  CBC WITH DIFFERENTIAL     Status: None   Collection Time    02/01/13  7:48 PM      Result Value Range   WBC 10.1  4.0 - 10.5 K/uL   RBC 4.89  3.87 - 5.11 MIL/uL   Hemoglobin 15.0  12.0 - 15.0 g/dL   HCT 40.9  81.1 - 91.4 %   MCV 90.8  78.0 - 100.0 fL   MCH 30.7  26.0 - 34.0 pg   MCHC 33.8  30.0 - 36.0 g/dL   RDW 78.2  95.6 - 21.3 %   Platelets 242  150 - 400 K/uL   Neutrophils Relative % 57  43 - 77 %   Neutro Abs 5.7  1.7 - 7.7 K/uL   Lymphocytes Relative 34  12 - 46 %   Lymphs Abs 3.4  0.7 - 4.0 K/uL   Monocytes Relative 8  3 - 12 %   Monocytes Absolute 0.8  0.1 - 1.0 K/uL   Eosinophils Relative 1  0 - 5 %   Eosinophils Absolute 0.1  0.0 - 0.7 K/uL    Basophils Relative 0  0 - 1 %   Basophils Absolute 0.0  0.0 - 0.1 K/uL  COMPREHENSIVE METABOLIC PANEL     Status: None   Collection Time    02/01/13  7:48 PM      Result Value Range   Sodium 138  135 - 145 mEq/L   Potassium 3.6  3.5 - 5.1 mEq/L   Chloride 103  96 - 112 mEq/L   CO2 22  19 - 32 mEq/L   Glucose, Bld 91  70 - 99 mg/dL   BUN 17  6 - 23 mg/dL   Creatinine, Ser 0.86  0.50 - 1.10 mg/dL   Calcium 57.8  8.4 - 46.9 mg/dL   Total Protein 7.8  6.0 - 8.3 g/dL   Albumin 4.1  3.5 - 5.2 g/dL   AST 17  0 - 37 U/L   ALT 19  0 - 35 U/L   Alkaline Phosphatase 90  39 - 117 U/L   Total Bilirubin 0.3  0.3 - 1.2 mg/dL   GFR calc non Af Amer >90  >90 mL/min   GFR calc Af Amer >90  >90 mL/min   Comment: (NOTE)     The eGFR has been calculated using the CKD EPI equation.     This calculation has not been validated in all clinical situations.     eGFR's persistently <90 mL/min signify possible Chronic Kidney     Disease.  ANA     Status: None   Collection Time    02/01/13  7:48 PM      Result Value Range   ANA NEGATIVE  NEGATIVE   Comment: Performed at [facility]  SEDIMENTATION RATE     Status: None   Collection Time    02/01/13  7:48 PM      Result Value Range   Sed Rate 9  0 - 22 mm/hr    Assessment/Plan: Asthma with bronchitis Rx azithromycin. Rx Medrol Dosepak. Increase fluid intake. Rest. Delsym for cough. Continue albuterol inhaler as needed. Humidifier in bedroom. Please call or return to clinic if symptoms not improving.

## 2013-03-20 NOTE — Assessment & Plan Note (Signed)
Rx azithromycin. Rx Medrol Dosepak. Increase fluid intake. Rest. Delsym for cough. Continue albuterol inhaler as needed. Humidifier in bedroom. Please call or return to clinic if symptoms not improving.

## 2013-03-21 ENCOUNTER — Encounter: Payer: Self-pay | Admitting: Physician Assistant

## 2013-03-21 ENCOUNTER — Telehealth: Payer: Self-pay | Admitting: Physician Assistant

## 2013-03-21 ENCOUNTER — Ambulatory Visit (INDEPENDENT_AMBULATORY_CARE_PROVIDER_SITE_OTHER): Payer: Self-pay | Admitting: Physician Assistant

## 2013-03-21 ENCOUNTER — Ambulatory Visit (HOSPITAL_BASED_OUTPATIENT_CLINIC_OR_DEPARTMENT_OTHER)
Admission: RE | Admit: 2013-03-21 | Discharge: 2013-03-21 | Disposition: A | Discharge status: Home / Self Care | Payer: Self-pay | Source: Ambulatory Visit | Attending: Physician Assistant | Admitting: Physician Assistant

## 2013-03-21 VITALS — BP 108/80 | HR 80 | Temp 98.2°F | Resp 16 | Ht 67.5 in | Wt 205.0 lb

## 2013-03-21 DIAGNOSIS — J45909 Unspecified asthma, uncomplicated: Secondary | ICD-10-CM

## 2013-03-21 DIAGNOSIS — R0989 Other specified symptoms and signs involving the circulatory and respiratory systems: Secondary | ICD-10-CM | POA: Insufficient documentation

## 2013-03-21 DIAGNOSIS — M899 Disorder of bone, unspecified: Secondary | ICD-10-CM | POA: Insufficient documentation

## 2013-03-21 DIAGNOSIS — J209 Acute bronchitis, unspecified: Secondary | ICD-10-CM

## 2013-03-21 DIAGNOSIS — R05 Cough: Secondary | ICD-10-CM | POA: Insufficient documentation

## 2013-03-21 DIAGNOSIS — R0789 Other chest pain: Secondary | ICD-10-CM

## 2013-03-21 DIAGNOSIS — R059 Cough, unspecified: Secondary | ICD-10-CM | POA: Insufficient documentation

## 2013-03-21 DIAGNOSIS — R9389 Abnormal findings on diagnostic imaging of other specified body structures: Secondary | ICD-10-CM

## 2013-03-21 MED ORDER — HYDROCOD POLST-CHLORPHEN POLST 10-8 MG/5ML PO LQCR: 5.0000 mL | Freq: Two times a day (BID) | ORAL | Status: DC | PRN

## 2013-03-21 NOTE — Patient Instructions (Signed)
Please continue medications as prescribed.  Tussionex for cough.  Go to the 1st floor for chest x-ray.  I will call you with your results.  We will alter medications if needed.

## 2013-03-21 NOTE — Progress Notes (Signed)
Patient ID: Angelica Diaz, female   DOB: [birth year], 32 y.o.   MRN: [id number]  Patient presents to clinic today complaining of persistent chest tightness and dry cough. Patient was seen on Friday and diagnosed with asthma with bronchitis. Patient prescribed azithromycin, albuterol rescue inhaler and Medrol Dosepak. Patient denies fever, chills, malaise or fatigue. Denies chest pain. Endorses chest tightness but no shortness of breath. Endorses occasional wheeze. Patient still has 1 more day of anabiotic and steroid. Patient does not feel like symptoms are getting worse. Just concerned about persistence of symptoms.   Past Medical History  Diagnosis Date  . Depression   . Headache(784.0)   . Anxiety   . Sleep disorder   . Migraine     Current Outpatient Prescriptions on File Prior to Visit  Medication Sig Dispense Refill  . albuterol (PROVENTIL HFA;VENTOLIN HFA) 108 (90 BASE) MCG/ACT inhaler Inhale 1-2 puffs into the lungs every 6 (six) hours as needed for wheezing or shortness of breath.  3.7 g  1  . azithromycin (ZITHROMAX) 250 MG tablet Take 2 tablets on Day 1.  Then take 1 tablet daily.  6 tablet  0  . clonazePAM (KLONOPIN) 0.5 MG tablet Take 1 mg by mouth at bedtime.      . flurbiprofen (ANSAID) 50 MG tablet Take 1 tablet (50 mg total) by mouth 2 (two) times daily. Take 1 tablet twice daily as needed, no more than twice per week.  20 tablet  0  . fluticasone (FLONASE) 50 MCG/ACT nasal spray Place 2 sprays into both nostrils as needed.      . methylPREDNIsolone (MEDROL DOSPACK) 4 MG tablet follow package directions  21 tablet  0  . sertraline (ZOLOFT) 100 MG tablet Take 50 mg by mouth daily.      . SUMAtriptan (IMITREX) 50 MG tablet Take 1 tablet (50 mg total) by mouth once as needed for migraine. May repeat in 2 hours if headache persists or recurs.  9 tablet  2  . topiramate (TOPAMAX) 25 MG tablet Take one tab daily x 1 week, then take one tablet twice daily, then as directed  120  tablet  3   No current facility-administered medications on file prior to visit.    No Known Allergies  Family History  Problem Relation Age of Onset  . Diabetes Maternal Grandfather   . Migraines Mother   . Migraines Son     History   Social History  . Marital Status: Single    Spouse Name: N/A    Number of Children: N/A  . Years of Education: N/A   Social History Main Topics  . Smoking status: Former Smoker -- 0.50 packs/day for 18 years    Types: Cigarettes    Quit date: 03/04/2013  . Smokeless tobacco: Never Used  . Alcohol Use: No     Comment: Occasionally  . Drug Use: No  . Sexual Activity: None   Other Topics Concern  . None   Social History Narrative   Currently sells insurance.   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  She has two healthy children.   Review of Systems - see history of present illness. All other review of systems are negative.  Filed Vitals:   03/21/13 1146  BP: 108/80  Pulse: 80  Temp: 98.2 F (36.8 C)  Resp: 16   Physical Exam  Vitals reviewed. Constitutional: She is oriented to person, place, and time and well-developed, well-nourished, and in no distress.  HENT:  Head: Normocephalic and atraumatic.  Right Ear: External ear normal.  Left Ear: External ear normal.  Nose: Nose normal.  Mouth/Throat: Oropharynx is clear and moist. No oropharyngeal exudate.  Tympanic membranes within normal limits bilaterally.  Eyes: Conjunctivae are normal. Pupils are equal, round, and reactive to light.  Neck: Neck supple.  Cardiovascular: Normal rate, regular rhythm, normal heart sounds and intact distal pulses.   Pulmonary/Chest: Effort normal and breath sounds normal. No respiratory distress. She has no wheezes. She has no rales. She exhibits no tenderness.  Lymphadenopathy:    She has no cervical adenopathy.  Neurological: She is alert and oriented to person, place, and time. No cranial nerve deficit.  Skin: Skin is warm and dry. No rash noted.   Psychiatric: Affect normal.     Recent Results (from the past 2160 hour(s))  CBC WITH DIFFERENTIAL     Status: None   Collection Time    02/01/13  7:48 PM      Result Value Range   WBC 10.1  4.0 - 10.5 K/uL   RBC 4.89  3.87 - 5.11 MIL/uL   Hemoglobin 15.0  12.0 - 15.0 g/dL   HCT 13.2  44.0 - 10.2 %   MCV 90.8  78.0 - 100.0 fL   MCH 30.7  26.0 - 34.0 pg   MCHC 33.8  30.0 - 36.0 g/dL   RDW 72.5  36.6 - 44.0 %   Platelets 242  150 - 400 K/uL   Neutrophils Relative % 57  43 - 77 %   Neutro Abs 5.7  1.7 - 7.7 K/uL   Lymphocytes Relative 34  12 - 46 %   Lymphs Abs 3.4  0.7 - 4.0 K/uL   Monocytes Relative 8  3 - 12 %   Monocytes Absolute 0.8  0.1 - 1.0 K/uL   Eosinophils Relative 1  0 - 5 %   Eosinophils Absolute 0.1  0.0 - 0.7 K/uL   Basophils Relative 0  0 - 1 %   Basophils Absolute 0.0  0.0 - 0.1 K/uL  COMPREHENSIVE METABOLIC PANEL     Status: None   Collection Time    02/01/13  7:48 PM      Result Value Range   Sodium 138  135 - 145 mEq/L   Potassium 3.6  3.5 - 5.1 mEq/L   Chloride 103  96 - 112 mEq/L   CO2 22  19 - 32 mEq/L   Glucose, Bld 91  70 - 99 mg/dL   BUN 17  6 - 23 mg/dL   Creatinine, Ser 3.47  0.50 - 1.10 mg/dL   Calcium 42.5  8.4 - 95.6 mg/dL   Total Protein 7.8  6.0 - 8.3 g/dL   Albumin 4.1  3.5 - 5.2 g/dL   AST 17  0 - 37 U/L   ALT 19  0 - 35 U/L   Alkaline Phosphatase 90  39 - 117 U/L   Total Bilirubin 0.3  0.3 - 1.2 mg/dL   GFR calc non Af Amer >90  >90 mL/min   GFR calc Af Amer >90  >90 mL/min   Comment: (NOTE)     The eGFR has been calculated using the CKD EPI equation.     This calculation has not been validated in all clinical situations.     eGFR's persistently <90 mL/min signify possible Chronic Kidney     Disease.  ANA     Status: None   Collection Time    02/01/13  7:48 PM  Result Value Range   ANA NEGATIVE  NEGATIVE   Comment: Performed at [facility]  SEDIMENTATION RATE     Status: None   Collection Time    02/01/13   7:48 PM      Result Value Range   Sed Rate 9  0 - 22 mm/hr    Assessment/Plan: Asthma with bronchitis Continue current medications. EKG obtained revealing normal sinus rhythm. Rx Tussionex for cough. Will obtain chest x-ray. Patient educated that cough can be lingering for up to a few weeks after resolution of bronchitis. Call should begin to improve a little bit each day.

## 2013-03-21 NOTE — Telephone Encounter (Signed)
Please inform patient that her chest x-ray looks good overall.  There is one area showing up on her x-ray that the radiologist believes is rib cartilage.  He recommends a repeat x-ray in 1 week with a different view of the chest to rule out a pulmonary nodule.  If symptoms do not begin to improve over the next couple of days, we will need to send patient to Pulmonologist for further evaluation and management.

## 2013-03-21 NOTE — Assessment & Plan Note (Signed)
Continue current medications. EKG obtained revealing normal sinus rhythm. Rx Tussionex for cough. Will obtain chest x-ray. Patient educated that cough can be lingering for up to a few weeks after resolution of bronchitis. Call should begin to improve a little bit each day.

## 2013-03-21 NOTE — Progress Notes (Signed)
Pre visit review using our clinic review tool, if applicable. No additional management support is needed unless otherwise documented below in the visit note/SLS  

## 2013-03-21 NOTE — Telephone Encounter (Signed)
Patient informed, understood & agreed; new order for repeat chest Xray placed/SLS

## 2013-03-25 ENCOUNTER — Encounter: Payer: Self-pay | Admitting: Physician Assistant

## 2013-03-28 ENCOUNTER — Other Ambulatory Visit: Payer: Self-pay | Admitting: Physician Assistant

## 2013-03-28 ENCOUNTER — Encounter: Payer: Self-pay | Admitting: Physician Assistant

## 2013-03-28 ENCOUNTER — Ambulatory Visit (HOSPITAL_BASED_OUTPATIENT_CLINIC_OR_DEPARTMENT_OTHER)
Admission: RE | Admit: 2013-03-28 | Discharge: 2013-03-28 | Disposition: A | Discharge status: Home / Self Care | Payer: Self-pay | Source: Ambulatory Visit | Attending: Physician Assistant | Admitting: Physician Assistant

## 2013-03-28 ENCOUNTER — Ambulatory Visit (INDEPENDENT_AMBULATORY_CARE_PROVIDER_SITE_OTHER): Payer: Self-pay | Admitting: Physician Assistant

## 2013-03-28 VITALS — BP 104/68 | HR 90 | Temp 98.1°F | Resp 16 | Ht 67.5 in | Wt 208.8 lb

## 2013-03-28 DIAGNOSIS — R05 Cough: Secondary | ICD-10-CM | POA: Insufficient documentation

## 2013-03-28 DIAGNOSIS — R9389 Abnormal findings on diagnostic imaging of other specified body structures: Secondary | ICD-10-CM

## 2013-03-28 DIAGNOSIS — R059 Cough, unspecified: Secondary | ICD-10-CM | POA: Insufficient documentation

## 2013-03-28 DIAGNOSIS — M79629 Pain in unspecified upper arm: Secondary | ICD-10-CM

## 2013-03-28 DIAGNOSIS — R5381 Other malaise: Secondary | ICD-10-CM

## 2013-03-28 DIAGNOSIS — R0989 Other specified symptoms and signs involving the circulatory and respiratory systems: Secondary | ICD-10-CM | POA: Insufficient documentation

## 2013-03-28 DIAGNOSIS — M79609 Pain in unspecified limb: Secondary | ICD-10-CM

## 2013-03-28 LAB — CBC WITH DIFFERENTIAL/PLATELET
Basophils Relative: 0 % (ref 0–1)
HCT: 42.2 % (ref 36.0–46.0)
Hemoglobin: 14.2 g/dL (ref 12.0–15.0)
Lymphocytes Relative: 41 % (ref 12–46)
Lymphs Abs: 2.8 10*3/uL (ref 0.7–4.0)
MCHC: 33.6 g/dL (ref 30.0–36.0)
MCV: 91.1 fL (ref 78.0–100.0)
Monocytes Absolute: 0.6 10*3/uL (ref 0.1–1.0)
Monocytes Relative: 8 % (ref 3–12)
Neutro Abs: 3.4 10*3/uL (ref 1.7–7.7)
Neutrophils Relative %: 49 % (ref 43–77)
RBC: 4.63 MIL/uL (ref 3.87–5.11)

## 2013-03-28 LAB — BASIC METABOLIC PANEL
BUN: 14 mg/dL (ref 6–23)
Chloride: 106 mEq/L (ref 96–112)
Glucose, Bld: 83 mg/dL (ref 70–99)
Potassium: 4.5 mEq/L (ref 3.5–5.3)

## 2013-03-28 NOTE — Patient Instructions (Signed)
Please obtain labs.  I will call you with your results.  I do not feel any enlarged lymph nodes on exam.  Try taking over the counter prilosec over the next two weeks to see if symptoms improve.  If cough persists, we will need to send you to ENT.

## 2013-03-29 ENCOUNTER — Telehealth: Payer: Self-pay | Admitting: Family Medicine

## 2013-03-29 NOTE — Telephone Encounter (Signed)
Patient had an xray last week and again yesterday.  She would like the results

## 2013-03-29 NOTE — Telephone Encounter (Signed)
Pt aware xray results is pending.

## 2013-04-01 DIAGNOSIS — M79629 Pain in unspecified upper arm: Secondary | ICD-10-CM | POA: Insufficient documentation

## 2013-04-01 DIAGNOSIS — R05 Cough: Secondary | ICD-10-CM | POA: Insufficient documentation

## 2013-04-01 DIAGNOSIS — R059 Cough, unspecified: Secondary | ICD-10-CM | POA: Insufficient documentation

## 2013-04-01 NOTE — Progress Notes (Signed)
Patient ID: Angelica Diaz, female   DOB: 11-18-1980, 33 y.o.   MRN: [id number]  Patient presents to clinic today complaining of continued dry cough despite antibiotic and steroid therapy his. Patient states the chest tightness has improved. Denies shortness of breath or wheezing. Denies fever, chills, malaise or fatigue. Patient denies having to use her albuterol inhaler. Recent chest x-ray revealed possibility of a pulmonary nodule but no evidence of pulmonary infection or pneumonia. Patient has had a repeat chest x-ray with lordotic view to reassess for pulmonary nodule. Results pending.  Patient also has noted some bilateral axillary adenopathy over the past week. States she noticed the area when shaving. Denies erythema, warmth or drainage. States areas are slightly tender. Denies adenopathy elsewhere. Denies fever, chills, malaise or fatigue, weight loss. Patient does have a history of anxiety and depression, and has seemed slightly anxious at previous visits.  Past Medical History  Diagnosis Date  . Depression   . Headache(784.0)   . Anxiety   . Sleep disorder   . Migraine     Current Outpatient Prescriptions on File Prior to Visit  Medication Sig Dispense Refill  . albuterol (PROVENTIL HFA;VENTOLIN HFA) 108 (90 BASE) MCG/ACT inhaler Inhale 1-2 puffs into the lungs every 6 (six) hours as needed for wheezing or shortness of breath.  3.7 g  1  . clonazePAM (KLONOPIN) 0.5 MG tablet Take 1 mg by mouth at bedtime.      . flurbiprofen (ANSAID) 50 MG tablet Take 1 tablet (50 mg total) by mouth 2 (two) times daily. Take 1 tablet twice daily as needed, no more than twice per week.  20 tablet  0  . sertraline (ZOLOFT) 100 MG tablet Take 50 mg by mouth daily.      . SUMAtriptan (IMITREX) 50 MG tablet Take 1 tablet (50 mg total) by mouth once as needed for migraine. May repeat in 2 hours if headache persists or recurs.  9 tablet  2  . topiramate (TOPAMAX) 25 MG tablet Take one tab daily x 1 week,  then take one tablet twice daily, then as directed  120 tablet  3   No current facility-administered medications on file prior to visit.    No Known Allergies  Family History  Problem Relation Age of Onset  . Diabetes Maternal Grandfather   . Migraines Mother   . Migraines Son     History   Social History  . Marital Status: Single    Spouse Name: N/A    Number of Children: N/A  . Years of Education: N/A   Social History Main Topics  . Smoking status: Former Smoker -- 0.50 packs/day for 18 years    Types: Cigarettes    Quit date: 03/04/2013  . Smokeless tobacco: Never Used  . Alcohol Use: No     Comment: Occasionally  . Drug Use: No  . Sexual Activity: None   Other Topics Concern  . None   Social History Narrative   Currently sells insurance.   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  She has two healthy children.   Review of Systems - See HPI.  All other ROS are negative.  Filed Vitals:   03/28/13 1501  BP: 104/68  Pulse: 90  Temp: 98.1 F (36.7 C)  Resp: 16   Physical Exam  Vitals reviewed. Constitutional: She is oriented to person, place, and time and well-developed, well-nourished, and in no distress.  HENT:  Head: Normocephalic and atraumatic.  Right Ear: External ear normal.  Left Ear:  External ear normal.  Nose: Nose normal.  Mouth/Throat: Oropharynx is clear and moist. No oropharyngeal exudate.  Tympanic membranes within normal limits bilaterally.  Eyes: Conjunctivae and EOM are normal. Pupils are equal, round, and reactive to light.  Neck: Neck supple.  Cardiovascular: Normal rate, regular rhythm, normal heart sounds and intact distal pulses.   Pulmonary/Chest: Effort normal and breath sounds normal. No respiratory distress. She has no wheezes. She has no rales. She exhibits no tenderness.  Lymphadenopathy:       Head (right side): No submental, no submandibular, no tonsillar, no preauricular, no posterior auricular and no occipital adenopathy present.        Head (left side): No submental, no submandibular, no tonsillar, no preauricular, no posterior auricular and no occipital adenopathy present.    She has no cervical adenopathy.       Right cervical: No superficial cervical, no deep cervical and no posterior cervical adenopathy present.      Left cervical: No superficial cervical, no deep cervical and no posterior cervical adenopathy present.    She has no axillary adenopathy.       Right axillary: No pectoral and no lateral adenopathy present.       Left axillary: No pectoral and no lateral adenopathy present.      Right: No supraclavicular and no epitrochlear adenopathy present.       Left: No supraclavicular and no epitrochlear adenopathy present.  Neurological: She is alert and oriented to person, place, and time.  Skin: Skin is warm and dry. No rash noted.  Psychiatric:  Anxious affect    Recent Results (from the past 2160 hour(s))  CBC WITH DIFFERENTIAL     Status: None   Collection Time    02/01/13  7:48 PM      Result Value Range   WBC 10.1  4.0 - 10.5 K/uL   RBC 4.89  3.87 - 5.11 MIL/uL   Hemoglobin 15.0  12.0 - 15.0 g/dL   HCT 44.4  36.0 - 46.0 %   MCV 90.8  78.0 - 100.0 fL   MCH 30.7  26.0 - 34.0 pg   MCHC 33.8  30.0 - 36.0 g/dL   RDW 13.0  11.5 - 15.5 %   Platelets 242  150 - 400 K/uL   Neutrophils Relative % 57  43 - 77 %   Neutro Abs 5.7  1.7 - 7.7 K/uL   Lymphocytes Relative 34  12 - 46 %   Lymphs Abs 3.4  0.7 - 4.0 K/uL   Monocytes Relative 8  3 - 12 %   Monocytes Absolute 0.8  0.1 - 1.0 K/uL   Eosinophils Relative 1  0 - 5 %   Eosinophils Absolute 0.1  0.0 - 0.7 K/uL   Basophils Relative 0  0 - 1 %   Basophils Absolute 0.0  0.0 - 0.1 K/uL  COMPREHENSIVE METABOLIC PANEL     Status: None   Collection Time    02/01/13  7:48 PM      Result Value Range   Sodium 138  135 - 145 mEq/L   Potassium 3.6  3.5 - 5.1 mEq/L   Chloride 103  96 - 112 mEq/L   CO2 22  19 - 32 mEq/L   Glucose, Bld 91  70 - 99 mg/dL   BUN 17   6 - 23 mg/dL   Creatinine, Ser 0.70  0.50 - 1.10 mg/dL   Calcium 10.0  8.4 - 10.5 mg/dL   Total  Protein 7.8  6.0 - 8.3 g/dL   Albumin 4.1  3.5 - 5.2 g/dL   AST 17  0 - 37 U/L   ALT 19  0 - 35 U/L   Alkaline Phosphatase 90  39 - 117 U/L   Total Bilirubin 0.3  0.3 - 1.2 mg/dL   GFR calc non Af Amer >90  >90 mL/min   GFR calc Af Amer >90  >90 mL/min   Comment: (NOTE)     The eGFR has been calculated using the CKD EPI equation.     This calculation has not been validated in all clinical situations.     eGFR's persistently <90 mL/min signify possible Chronic Kidney     Disease.  ANA     Status: None   Collection Time    02/01/13  7:48 PM      Result Value Range   ANA NEGATIVE  NEGATIVE   Comment: Performed at [facility]     Status: None   Collection Time    02/01/13  7:48 PM      Result Value Range   Sed Rate 9  0 - 22 mm/hr  CBC WITH DIFFERENTIAL     Status: None   Collection Time    03/28/13  3:36 PM      Result Value Range   WBC 6.9  4.0 - 10.5 K/uL   RBC 4.63  3.87 - 5.11 MIL/uL   Hemoglobin 14.2  12.0 - 15.0 g/dL   HCT 42.2  36.0 - 46.0 %   MCV 91.1  78.0 - 100.0 fL   MCH 30.7  26.0 - 34.0 pg   MCHC 33.6  30.0 - 36.0 g/dL   RDW 13.8  11.5 - 15.5 %   Platelets 305  150 - 400 K/uL   Neutrophils Relative % 49  43 - 77 %   Neutro Abs 3.4  1.7 - 7.7 K/uL   Lymphocytes Relative 41  12 - 46 %   Lymphs Abs 2.8  0.7 - 4.0 K/uL   Monocytes Relative 8  3 - 12 %   Monocytes Absolute 0.6  0.1 - 1.0 K/uL   Eosinophils Relative 2  0 - 5 %   Eosinophils Absolute 0.1  0.0 - 0.7 K/uL   Basophils Relative 0  0 - 1 %   Basophils Absolute 0.0  0.0 - 0.1 K/uL   Smear Review Criteria for review not met    BASIC METABOLIC PANEL     Status: None   Collection Time    03/28/13  3:36 PM      Result Value Range   Sodium 139  135 - 145 mEq/L   Potassium 4.5  3.5 - 5.3 mEq/L   Chloride 106  96 - 112 mEq/L   CO2 22  19 - 32 mEq/L   Glucose, Bld 83  70 - 99  mg/dL   BUN 14  6 - 23 mg/dL   Creat 0.68  0.50 - 1.10 mg/dL   Calcium 8.7  8.4 - 10.5 mg/dL    Assessment/Plan: Cough Vital signs and physical examination within normal limits. There is a possibility that the cough is residual from recent asthma exacerbation and bronchitis. Also discussed other causes of chronic cough with patient. Patient instructed to monitor diet. Avoid spicy foods or eating late at night. Take 2 TUMS at bedtime. Continue over-the-counter cough medications as needed. Will need referral to ENT if symptoms persist.  Axillary tenderness  Did not appreciate any axillary adenopathy or other lymphadenopathy on examination. Vital signs within normal limits. Questionable mild axillary tenderness with palpation. The patient's concerns are possibly due to her anxiety. Will obtain lab work at today's visit.

## 2013-04-01 NOTE — Assessment & Plan Note (Signed)
Vital signs and physical examination within normal limits. There is a possibility that the cough is residual from recent asthma exacerbation and bronchitis. Also discussed other causes of chronic cough with patient. Patient instructed to monitor diet. Avoid spicy foods or eating late at night. Take 2 TUMS at bedtime. Continue over-the-counter cough medications as needed. Will need referral to ENT if symptoms persist.

## 2013-04-01 NOTE — Assessment & Plan Note (Signed)
Did not appreciate any axillary adenopathy or other lymphadenopathy on examination. Vital signs within normal limits. Questionable mild axillary tenderness with palpation. The patient's concerns are possibly due to her anxiety. Will obtain lab work at today's visit.

## 2013-04-05 ENCOUNTER — Ambulatory Visit: Payer: Self-pay | Admitting: Neurology

## 2013-04-08 ENCOUNTER — Ambulatory Visit (INDEPENDENT_AMBULATORY_CARE_PROVIDER_SITE_OTHER): Payer: BC Managed Care – PPO | Admitting: Neurology

## 2013-04-08 ENCOUNTER — Encounter: Payer: Self-pay | Admitting: Neurology

## 2013-04-08 VITALS — BP 118/72 | HR 80 | Resp 18 | Ht 67.5 in | Wt 208.6 lb

## 2013-04-08 DIAGNOSIS — F329 Major depressive disorder, single episode, unspecified: Secondary | ICD-10-CM

## 2013-04-08 DIAGNOSIS — F32A Depression, unspecified: Secondary | ICD-10-CM

## 2013-04-08 DIAGNOSIS — G43009 Migraine without aura, not intractable, without status migrainosus: Secondary | ICD-10-CM

## 2013-04-08 DIAGNOSIS — F3289 Other specified depressive episodes: Secondary | ICD-10-CM

## 2013-04-08 MED ORDER — TOPIRAMATE 50 MG PO TABS: 50.0000 mg | ORAL_TABLET | Freq: Two times a day (BID) | ORAL | Status: DC

## 2013-04-08 MED ORDER — CYCLOBENZAPRINE HCL 5 MG PO TABS: ORAL_TABLET | ORAL | Status: DC

## 2013-04-08 NOTE — Progress Notes (Signed)
[facility]  Follow-up Visit   Date: 04/08/2013    Angelica Diaz MRN: [id number] DOB: 09-Feb-1981   Interim History:e Angelica Diaz is a 33 y.o. year-old right-handed Caucasian female with history of anxiety and depression returning to the clinic for follow-up of migraines.  The patient was accompanied to the clinic by self.  She was last seen on the clinic on 01/17/2013.   History of present illness: Intial visit 12/10/2012:  Patient reports migraines started around the age of 35. As a teenager to the age of 82, they would occur once per week, but this has improved since then. Pain is characterized as throbbing pain all over her head and does not radiate into her arms or neck. Duration is 1-3 days and frequency is once every 4-6 weeks. She endorses photophobia, phonophobia, nausea, vomiting, and lack of appetite. No flashing lights, numbness/tingling, or weakness. Stress exacerbates symptoms. Usually, she takes ibuprofen and sleeps, which helps. Currently, pain is a 3/10 and when severe 10/10. She takes ibuprofen 4-8 pills about 2-3x per week. She has previously tried hydrocodone, reglan, and was given a prescription for zomig which she never filled.   On September 3, she was was celebrating her daughter's birthday and her entire face went completely numb. She had associated slurring of her speech and she broke out in a cold sweat. This lasted 20 minutes. She sat down, otherwise she would have passed out. About an hour later, she had the worst migraine she has ever encountered. She placed an ice pack on her head, took ibuprofen, and tried to lay down. Pain slowly resolved by the following afternoon. On September 10, she went to the emergency department because she still was not feeling back to her baseline and continued to have a dull holocephalic headache and new word-finding problems. She remembers talking and not being able to get the words out. She had CT brain,  but we do not have the results to review. She received a headache cocktail, without significant relief.  Follow-up 12/24/2012:  Started propranolol but developed vivid dreams.  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 Extra and intracranial vessels were patent.     Follow-up 01/17/2013: She does get relief with Ansaid and is taking it 1-2 twice per week. She continues to have migraines daily, with pain over the entire headache (L >R) with migrainous features.  Started on TPM.  Interval History: She has been on topamax 50mg  BID and has been tolerating it well.  She says that her daily headaches are resolved..  She has occasional tingling of her feet. She had a severe headache in early December and went to the emergency department where she received headache cocktail, but did not have significant benefit.  She attributes this to being tapered off Chantix.  Since then, she has not had any headaches.  She tells me that she quit smoking in December 5th, 2014, which is great!   Memory problems have also improved.   Medications:  Current Outpatient Prescriptions on File Prior to Visit  Medication Sig Dispense Refill  . clonazePAM (KLONOPIN) 0.5 MG tablet Take 1 mg by mouth at bedtime.      . flurbiprofen (ANSAID) 50 MG tablet Take 1 tablet (50 mg total) by mouth 2 (two) times daily. Take 1 tablet twice daily as needed, no more than twice per week.  20 tablet  0  .  sertraline (ZOLOFT) 100 MG tablet Take 50 mg by mouth daily.      . SUMAtriptan (IMITREX) 50 MG tablet Take 1 tablet (50 mg total) by mouth once as needed for migraine. May repeat in 2 hours if headache persists or recurs.  9 tablet  2  . topiramate (TOPAMAX) 25 MG tablet Take one tab daily x 1 week, then take one tablet twice daily, then as directed  120 tablet  3   No current facility-administered medications on file prior to visit.     Allergies: No Known Allergies   Review of Systems:  CONSTITUTIONAL: No fevers, chills, night sweats, or weight loss.   EYES: No visual changes or eye pain ENT: No hearing changes.  No history of nose bleeds.   RESPIRATORY: No cough, wheezing and shortness of breath.   CARDIOVASCULAR: Negative for chest pain, and palpitations.   GI: Negative for abdominal discomfort, blood in stools or black stools.  No recent change in bowel habits.   GU:  No history of incontinence.   MUSCLOSKELETAL: No history of joint pain or swelling.  No myalgias.   SKIN: Negative for lesions, rash, and itching.   HEMATOLOGY/ONCOLOGY: Negative for prolonged bleeding, bruising easily, and swollen nodes.   ENDOCRINE: Negative for cold or heat intolerance, polydipsia or goiter.   PSYCH:  +depression or anxiety symptoms.   NEURO: As Above.   Vital Signs:  BP 118/72  Pulse 80  Resp 18  Ht 5' 7.5" (1.715 m)  Wt 208 lb 9 oz (94.603 kg)  BMI 32.16 kg/m2 Pain Scale: 4 on a scale of 0-10    Neurological Exam: MENTAL STATUS:  Awake, alert, oriented x 3. Speech is not dysarthric.  CRANIAL NERVES:   Pupils equal round and reactive to light.  Normal conjugate, extra-ocular eye movements in all directions of gaze.  No nystagmus.  No ptosis.  Normal facial symmetry and movements.  Palate elevates symmetrically.    MOTOR:  Motor strength is 5/5 in all four extremities. Prominent tension over the cervical muscles.    COORDINATION/GAIT: Gait narrow based and stable. Tandem and stressed gait intact.   Data: MRI brain 12/20/2012: Subcentimeter left posterior frontal extra-axial mass lesion, likely incidental meningioma. Otherwise negative exam. MRA circle of willis and carotids 12/20/2012:  Patent vasculature   IMPRESSION: 1. Episodic migraine  - Preventative therapy with topamax which she is tolerating well  - Imitrex for severe pain 2.  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 Medication overuse headaches   - Resolved 3. Depression   PLAN/RECOMMENDATIONS:  1. Preventative therapy   - Continue topamax 50mg  BID  - Start taking flexeril 5mg  as needed for neck pain 2. Rescue therapy   For severe headaches, take sumatriptan 50mg  PO at onset of severe headaches, if no improvement in 2 hours, may take another pill.   For moderate headaches, take Ansaid 50mg  twice daily as needed.  3. Patient instructed not to take sumatriptan, Ansaid, OR advil more than 9 days a month. 4. Encouraged her to perform neck stretches and exercises daily 6. Scheduled to see psychiatry in April for for depression 7. Return to clinic in 10-months, or sooner as needed  The duration of this appointment visit was 25 minutes of face-to-face time with the patient.  Greater than 50% of this time was spent in  counseling, explanation of diagnosis, planning of further management, and coordination of care.   Thank you for allowing me to participate in patient's care.  If I can answer any additional questions, I would be pleased to do so.    Sincerely,    Arnulfo Batson K. Posey Pronto, DO

## 2013-04-08 NOTE — Patient Instructions (Addendum)
Continue taking topiramate 50mg  twice daily For moderate headaches, take flexeril 5mg  and ansaid 50mg  as needed For severe headaches, take imitrex 50mg  at onset of severe headache, if no improvement in 2 hours, may take another pill Do not take sumatriptan, Ansaid, OR advil more than 9 days a month I will see you again in 31-months Kudos to you for stopping smoking!

## 2013-04-13 ENCOUNTER — Encounter: Payer: BC Managed Care – PPO | Admitting: Family Medicine

## 2013-05-01 ENCOUNTER — Other Ambulatory Visit: Payer: Self-pay | Admitting: Family Medicine

## 2013-05-03 ENCOUNTER — Telehealth: Payer: Self-pay | Admitting: Neurology

## 2013-05-03 MED ORDER — FLURBIPROFEN 50 MG PO TABS: 50.0000 mg | ORAL_TABLET | Freq: Two times a day (BID) | ORAL | Status: DC

## 2013-05-03 NOTE — Telephone Encounter (Signed)
Refill request received from [facility] for Ansaid 50 mg. Refill approved with 3 additional refills per Dr Posey Pronto. E-scribed to patient's pharmacy.

## 2013-05-03 NOTE — Telephone Encounter (Signed)
Noted and agree.  Donika K. Posey Pronto, DO

## 2013-05-06 ENCOUNTER — Encounter: Payer: Self-pay | Admitting: Family Medicine

## 2013-05-06 DIAGNOSIS — R1013 Epigastric pain: Secondary | ICD-10-CM

## 2013-05-10 ENCOUNTER — Encounter: Payer: Self-pay | Admitting: Gastroenterology

## 2013-05-10 ENCOUNTER — Ambulatory Visit (INDEPENDENT_AMBULATORY_CARE_PROVIDER_SITE_OTHER): Payer: BC Managed Care – PPO | Admitting: Gastroenterology

## 2013-05-10 VITALS — BP 120/88 | HR 64 | Ht 67.5 in | Wt 213.4 lb

## 2013-05-10 DIAGNOSIS — R109 Unspecified abdominal pain: Secondary | ICD-10-CM

## 2013-05-10 DIAGNOSIS — R1011 Right upper quadrant pain: Secondary | ICD-10-CM

## 2013-05-10 NOTE — Assessment & Plan Note (Signed)
7 year history of intermittent severe right upper quadrant pain, negative ultrasound but HIDA scan demonstrating a decreased gallbladder ejection fraction.  LFTs in November, 2014 were normal.  Symptoms are very suggestive for chronic cholecystitis.  Recommendations #1 repeat abdominal ultrasound #2 referred to Gen. surgery for consideration of cholecystectomy

## 2013-05-10 NOTE — Patient Instructions (Signed)
You have been scheduled for an abdominal ultrasound at [facility] (1st floor of hospital) on 05/13/2013 at 9am. Please arrive 15 minutes prior to your appointment for registration. Make certain not to have anything to eat or drink 6 hours prior to your appointment. Should you need to reschedule your appointment, please contact radiology at [phone]. This test typically takes about 30 minutes to perform.   You have been scheduled for an appointment with ___________ at [facility]. Your appointment is on ___________ at ____________. Please arrive at __________ for registration. Make certain to bring a list of current medications, including any over the counter medications or vitamins. Also bring your co-pay if you have one as well as your insurance cards. [facility] is located at [address], [address]. Should you need to reschedule your appointment, please contact them at [phone].

## 2013-05-10 NOTE — Progress Notes (Signed)
_                                                                                                                History of Present Illness: 33 year old white female referred for evaluation of abdominal pain.  Since 2007 she's had at least 8 episodes of severe right upper quadrant pain.  Pain may last days at a time and is described as excruciating sharp pain that radiates around to the back.  Her last attack several days ago was not improved with pain medicines.  She denies nausea, vomiting or jaundice.  Ultrasound in 2007 was normal.  HIDA scan in 2007 demonstrated a patent cystic duct but a gallbladder ejection fraction of 16% at one hour.  She denies pyrosis or change in bowel habits.    Past Medical History  Diagnosis Date  . Depression   . Headache(784.0)   . Anxiety   . Sleep disorder   . Migraine    Past Surgical History  Procedure Laterality Date  . Wisdom tooth extraction    . Cesarean section     family history includes Diabetes in her maternal grandfather; Migraines in her mother and son. Current Outpatient Prescriptions  Medication Sig Dispense Refill  . clonazePAM (KLONOPIN) 0.5 MG tablet take 1 to 2 tablets by mouth at bedtime if needed  180 tablet  1  . flurbiprofen (ANSAID) 50 MG tablet Take 1 tablet (50 mg total) by mouth 2 (two) times daily. Take 1 tablet twice daily as needed, no more than twice per week.  20 tablet  3  . sertraline (ZOLOFT) 100 MG tablet Take 50 mg by mouth daily.      . SUMAtriptan (IMITREX) 50 MG tablet Take 1 tablet (50 mg total) by mouth once as needed for migraine. May repeat in 2 hours if headache persists or recurs.  9 tablet  2  . topiramate (TOPAMAX) 50 MG tablet Take 1 tablet (50 mg total) by mouth 2 (two) times daily.  30 tablet  6   No current facility-administered medications for this visit.   Allergies as of 05/10/2013  . (No Known Allergies)    reports that she quit smoking about 2 months ago. Her smoking use  included Cigarettes. She has a 9 pack-year smoking history. She has never used smokeless tobacco. She reports that she does not drink alcohol or use illicit drugs.     Review of Systems: Pertinent positive and negative review of systems were noted in the above HPI section. All other review of systems were otherwise negative.  Vital signs were reviewed in today's medical record Physical Exam: General: Well developed , well nourished, no acute distress Skin: anicteric Head: Normocephalic and atraumatic Eyes:  sclerae anicteric, EOMI Ears: Normal auditory acuity Mouth: No deformity or lesions Neck: Supple, no masses or thyromegaly Lungs: Clear throughout to auscultation Heart: Regular rate and rhythm; no murmurs, rubs or bruits Abdomen: Soft, non tender and non distended. No masses, hepatosplenomegaly or hernias noted. Normal Bowel sounds Rectal:deferred  Musculoskeletal: Symmetrical with no gross deformities  Skin: No lesions on visible extremities Pulses:  Normal pulses noted Extremities: No clubbing, cyanosis, edema or deformities noted Neurological: Alert oriented x 4, grossly nonfocal Cervical Nodes:  No significant cervical adenopathy Inguinal Nodes: No significant inguinal adenopathy Psychological:  Alert and cooperative. Normal mood and affect  See Assessment and Plan under Problem List

## 2013-05-12 ENCOUNTER — Ambulatory Visit (HOSPITAL_COMMUNITY)
Admission: RE | Admit: 2013-05-12 | Discharge: 2013-05-12 | Disposition: A | Discharge status: Home / Self Care | Payer: BC Managed Care – PPO | Source: Ambulatory Visit | Attending: Gastroenterology | Admitting: Gastroenterology

## 2013-05-12 DIAGNOSIS — R109 Unspecified abdominal pain: Secondary | ICD-10-CM | POA: Insufficient documentation

## 2013-05-13 ENCOUNTER — Ambulatory Visit (HOSPITAL_COMMUNITY): Payer: BC Managed Care – PPO

## 2013-05-13 ENCOUNTER — Encounter (INDEPENDENT_AMBULATORY_CARE_PROVIDER_SITE_OTHER): Payer: Self-pay | Admitting: Surgery

## 2013-05-13 NOTE — Progress Notes (Signed)
Quick Note:  Please inform the patient that ultrasound was normal and to continue current plan of action ______ 

## 2013-05-18 ENCOUNTER — Ambulatory Visit (INDEPENDENT_AMBULATORY_CARE_PROVIDER_SITE_OTHER): Payer: BC Managed Care – PPO | Admitting: Surgery

## 2013-05-25 ENCOUNTER — Ambulatory Visit (INDEPENDENT_AMBULATORY_CARE_PROVIDER_SITE_OTHER): Payer: BC Managed Care – PPO | Admitting: Surgery

## 2013-05-25 ENCOUNTER — Encounter (HOSPITAL_COMMUNITY): Payer: Self-pay | Admitting: *Deleted

## 2013-05-25 ENCOUNTER — Encounter (INDEPENDENT_AMBULATORY_CARE_PROVIDER_SITE_OTHER): Payer: Self-pay | Admitting: Surgery

## 2013-05-25 VITALS — BP 124/81 | HR 74 | Temp 97.8°F | Resp 18 | Ht 67.5 in | Wt 210.8 lb

## 2013-05-25 DIAGNOSIS — K828 Other specified diseases of gallbladder: Secondary | ICD-10-CM | POA: Insufficient documentation

## 2013-05-25 DIAGNOSIS — R1011 Right upper quadrant pain: Secondary | ICD-10-CM

## 2013-05-25 NOTE — Progress Notes (Signed)
Patient reports at time of preop instructions that had last Depo injection 10/2012 and has not had menstrual period since.

## 2013-05-25 NOTE — Patient Instructions (Signed)
  [facility]  LAPAROSCOPIC SURGERY - POST-OP INSTRUCTIONS  Always review your discharge instruction sheet given to you by the facility where your surgery was performed.  A prescription for pain medication may be given to you upon discharge.  Take your pain medication as prescribed.  If narcotic pain medicine is not needed, then you may take acetaminophen (Tylenol) or ibuprofen (Advil) as needed.  Take your usually prescribed medications unless otherwise directed.  If you need a refill on your pain medication, please contact your pharmacy.  They will contact our office to request authorization. Prescriptions will not be filled after 5 P.M. or on weekends.  You should follow a light diet the first few days after arrival home, such as soup and crackers or toast.  Be sure to include plenty of fluids daily.  Most patients will experience some swelling and bruising in the area of the incisions.  Ice packs will help.  Swelling and bruising can take several days to resolve.   It is common to experience some constipation if taking pain medication after surgery.  Increasing fluid intake and taking a stool softener (such as Colace) will usually help or prevent this problem from occurring.  A mild laxative (Milk of Magnesia or Miralax) should be taken according to package instructions if there are no bowel movements after 48 hours.  Unless discharge instructions indicate otherwise, you may remove your bandages 24-48 hours after surgery, and you may shower at that time.  You may have steri-strips (small skin tapes) in place directly over the incision.  These strips should be left on the skin for 7-10 days.  If your surgeon used skin glue on the incision, you may shower in 24 hours.  The glue will flake off over the next 2-3 weeks.  Any sutures or staples will be removed at the office during your follow-up visit.  ACTIVITIES:  You may resume regular (light) daily activities beginning the  next day-such as daily self-care, walking, climbing stairs-gradually increasing activities as tolerated.  You may have sexual intercourse when it is comfortable.  Refrain from any heavy lifting or straining until approved by your doctor.  You may drive when you are no longer taking prescription pain medication, you can comfortably wear a seatbelt, and you can safely maneuver your car and apply brakes.  You should see your doctor in the office for a follow-up appointment approximately 2-3 weeks after your surgery.  Make sure that you call for this appointment within a day or two after you arrive home to insure a convenient appointment time.  WHEN TO CALL YOUR DOCTOR: 1. Fever over 101.0 2. Inability to urinate 3. Continued bleeding from incision 4. Increased pain, redness, or drainage from the incision 5. Increasing abdominal pain  The clinic staff is available to answer your questions during regular business hours.  Please don't hesitate to call and ask to speak to one of the nurses for clinical concerns.  If you have a medical emergency, go to the nearest emergency room or call 911.  A surgeon from [facility] is always on call for the hospital.  Key Cen M. Kearney Evitt, MD, FACS [facility] Office: [phone] Toll Free:  [phone] FAX [phone]  Web site: [email or web address] 

## 2013-05-25 NOTE — Progress Notes (Signed)
General Surgery [facility]  Chief Complaint  Patient presents with  . New Evaluation    abdominal pain, biliary dyskinesia - referral from Dr. Erskine Emery    HISTORY: The patient is a 33 year old female referred by her gastroenterologist for evaluation of biliary dyskinesia. Patient has a 7 year history of intermittent right upper quadrant abdominal pain radiating to the back. She has had multiple episodes. Episodes may be brief or may last for several days. If she experiences nausea and occasional emesis. She denies fevers or chills. She denies jaundice or acholic stools. She has had no previous hepatobiliary disease. Her only previous abdominal surgery was cesarean section.  Patient underwent abdominal ultrasound which shows no gallstones and no abnormality of the biliary tract. A hepatobiliary scan documented biliary dyskinesia with an ejection fraction at 60 minutes of 16%.  There is a family history of gallbladder disease and multiple family members including her mother who underwent cholecystectomy.  Past Medical History  Diagnosis Date  . Depression   . Headache(784.0)   . Anxiety   . Sleep disorder   . Migraine   . Arthritis     Current Outpatient Prescriptions  Medication Sig Dispense Refill  . clonazePAM (KLONOPIN) 0.5 MG tablet take 1 to 2 tablets by mouth at bedtime if needed  180 tablet  1  . flurbiprofen (ANSAID) 50 MG tablet Take 1 tablet (50 mg total) by mouth 2 (two) times daily. Take 1 tablet twice daily as needed, no more than twice per week.  20 tablet  3  . sertraline (ZOLOFT) 100 MG tablet Take 50 mg by mouth daily.      . SUMAtriptan (IMITREX) 50 MG tablet Take 1 tablet (50 mg total) by mouth once as needed for migraine. May repeat in 2 hours if headache persists or recurs.  9 tablet  2  . topiramate (TOPAMAX) 50 MG tablet Take 1 tablet (50 mg total) by mouth 2 (two) times daily.  30 tablet  6   No current facility-administered  medications for this visit.    No Known Allergies  Family History  Problem Relation Age of Onset  . Diabetes Maternal Grandfather   . Migraines Mother   . Migraines Son     History   Social History  . Marital Status: Single    Spouse Name: N/A    Number of Children: 2  . Years of Education: N/A   Occupational History  . Insurance agent    Social History Main Topics  . Smoking status: Former Smoker -- 0.50 packs/day for 18 years    Types: Cigarettes    Quit date: 03/04/2013  . Smokeless tobacco: Never Used  . Alcohol Use: No     Comment: Occasionally  . Drug Use: No  . Sexual Activity: None   Other Topics Concern  . None   Social History Narrative   Currently sells insurance.   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  She has two healthy children.    REVIEW OF SYSTEMS - PERTINENT POSITIVES ONLY: Denies jaundice. Denies acholic stools. Nausea with episodes of pain. Occasional emesis.  EXAM: Filed Vitals:   05/25/13 0958  BP: 124/81  Pulse: 74  Temp: 97.8 F (36.6 C)  Resp: 18    GENERAL: well-developed, well-nourished, no acute distress HEENT: normocephalic; pupils equal and reactive; sclerae clear; dentition good; mucous membranes moist NECK:  No palpable masses in the thyroid bedsymmetric on extension; no palpable anterior or posterior cervical lymphadenopathy; no supraclavicular masses; no  tenderness CHEST: clear to auscultation bilaterally without rales, rhonchi, or wheezes CARDIAC: regular rate and rhythm without significant murmur; peripheral pulses are full ABDOMEN: soft without distension; bowel sounds present; no mass; no hepatosplenomegaly; no hernia EXT:  non-tender without edema; no deformity NEURO: no gross focal deficits; no sign of tremor   LABORATORY RESULTS: See [facility] (CHL-Epic) for most recent results  RADIOLOGY RESULTS: See [facility] (CHL-Epic) for most recent results  IMPRESSION: Abdominal pain, likely biliary  dyskinesia  PLAN: The patient and I discussed the above findings at length. We reviewed her ultrasound and her hepatobiliary scan. Liver function test are normal.  We discussed laparoscopic cholecystectomy with intraoperative cholangiography. We discussed risk and benefits of the procedure including the potential for conversion to open surgery. We discussed the likelihood of symptom relief been on the order of 70% success. We discussed the hospital stay and her postoperative recovery and return to work. She understands and wishes to proceed. We will make arrangements for surgery in the near future.  The risks and benefits of the procedure have been discussed at length with the patient.  The patient understands the proposed procedure, potential alternative treatments, and the course of recovery to be expected.  All of the patient's questions have been answered at this time.  The patient wishes to proceed with surgery.  Alahni Varone M. Rozelia Catapano, MD, FACS General & Endocrine Surgery [facility]  Primary Care Physician: Kaylyn Garrow,JEFFREY ALLEN, MD   

## 2013-05-27 ENCOUNTER — Encounter (HOSPITAL_COMMUNITY)
Admission: RE | Disposition: A | Discharge status: Home / Self Care | Payer: Self-pay | Source: Ambulatory Visit | Attending: Surgery

## 2013-05-27 ENCOUNTER — Ambulatory Visit (HOSPITAL_COMMUNITY): Payer: BC Managed Care – PPO

## 2013-05-27 ENCOUNTER — Encounter (HOSPITAL_COMMUNITY): Payer: BC Managed Care – PPO | Admitting: Anesthesiology

## 2013-05-27 ENCOUNTER — Observation Stay (HOSPITAL_COMMUNITY)
Admission: RE | Admit: 2013-05-27 | Discharge: 2013-05-28 | Disposition: A | Discharge status: Home / Self Care | Payer: BC Managed Care – PPO | Source: Ambulatory Visit | Attending: Surgery | Admitting: Surgery

## 2013-05-27 ENCOUNTER — Encounter (HOSPITAL_COMMUNITY): Payer: Self-pay | Admitting: *Deleted

## 2013-05-27 ENCOUNTER — Ambulatory Visit (HOSPITAL_COMMUNITY): Payer: BC Managed Care – PPO | Admitting: Anesthesiology

## 2013-05-27 DIAGNOSIS — F3289 Other specified depressive episodes: Secondary | ICD-10-CM | POA: Insufficient documentation

## 2013-05-27 DIAGNOSIS — Z87891 Personal history of nicotine dependence: Secondary | ICD-10-CM | POA: Insufficient documentation

## 2013-05-27 DIAGNOSIS — K828 Other specified diseases of gallbladder: Secondary | ICD-10-CM

## 2013-05-27 DIAGNOSIS — F329 Major depressive disorder, single episode, unspecified: Secondary | ICD-10-CM | POA: Insufficient documentation

## 2013-05-27 DIAGNOSIS — Z79899 Other long term (current) drug therapy: Secondary | ICD-10-CM | POA: Insufficient documentation

## 2013-05-27 DIAGNOSIS — K811 Chronic cholecystitis: Secondary | ICD-10-CM

## 2013-05-27 DIAGNOSIS — R1011 Right upper quadrant pain: Secondary | ICD-10-CM | POA: Insufficient documentation

## 2013-05-27 DIAGNOSIS — E669 Obesity, unspecified: Secondary | ICD-10-CM | POA: Insufficient documentation

## 2013-05-27 DIAGNOSIS — F411 Generalized anxiety disorder: Secondary | ICD-10-CM | POA: Insufficient documentation

## 2013-05-27 HISTORY — DX: Nausea with vomiting, unspecified: R11.2

## 2013-05-27 HISTORY — DX: Unspecified asthma, uncomplicated: J45.909

## 2013-05-27 HISTORY — DX: Other specified postprocedural states: Z98.890

## 2013-05-27 HISTORY — PX: CHOLECYSTECTOMY: SHX55

## 2013-05-27 LAB — CBC
HCT: 42.5 % (ref 36.0–46.0)
Hemoglobin: 14.2 g/dL (ref 12.0–15.0)
MCH: 31 pg (ref 26.0–34.0)
MCHC: 33.4 g/dL (ref 30.0–36.0)
MCV: 92.8 fL (ref 78.0–100.0)
PLATELETS: 258 10*3/uL (ref 150–400)
RBC: 4.58 MIL/uL (ref 3.87–5.11)
RDW: 12.2 % (ref 11.5–15.5)
WBC: 9.5 10*3/uL (ref 4.0–10.5)

## 2013-05-27 LAB — HCG, SERUM, QUALITATIVE: PREG SERUM: NEGATIVE

## 2013-05-27 SURGERY — LAPAROSCOPIC CHOLECYSTECTOMY WITH INTRAOPERATIVE CHOLANGIOGRAM
Anesthesia: General

## 2013-05-27 MED ORDER — OXYCODONE HCL 5 MG PO TABS: 5.0000 mg | ORAL_TABLET | Freq: Once | ORAL | Status: DC | PRN

## 2013-05-27 MED ORDER — HYDROMORPHONE HCL PF 1 MG/ML IJ SOLN
INTRAMUSCULAR | Status: AC
  Filled 2013-05-27: qty 1

## 2013-05-27 MED ORDER — GLYCOPYRROLATE 0.2 MG/ML IJ SOLN
INTRAMUSCULAR | Status: DC | PRN
  Administered 2013-05-27: 0.6 mg via INTRAVENOUS

## 2013-05-27 MED ORDER — NEOSTIGMINE METHYLSULFATE 1 MG/ML IJ SOLN
INTRAMUSCULAR | Status: AC
  Filled 2013-05-27: qty 10

## 2013-05-27 MED ORDER — FENTANYL CITRATE 0.05 MG/ML IJ SOLN
INTRAMUSCULAR | Status: AC
  Filled 2013-05-27: qty 5

## 2013-05-27 MED ORDER — ONDANSETRON HCL 4 MG/2ML IJ SOLN
INTRAMUSCULAR | Status: DC | PRN
  Administered 2013-05-27: 4 mg via INTRAVENOUS

## 2013-05-27 MED ORDER — HYDROCODONE-ACETAMINOPHEN 5-325 MG PO TABS: 1.0000 | ORAL_TABLET | ORAL | Status: DC | PRN

## 2013-05-27 MED ORDER — LACTATED RINGERS IV SOLN
INTRAVENOUS | Status: DC | PRN
  Administered 2013-05-27 (×2): via INTRAVENOUS

## 2013-05-27 MED ORDER — KCL IN DEXTROSE-NACL 20-5-0.45 MEQ/L-%-% IV SOLN
INTRAVENOUS | Status: DC
  Administered 2013-05-27: 19:00:00 via INTRAVENOUS
  Filled 2013-05-27 (×2): qty 1000

## 2013-05-27 MED ORDER — HYDROCODONE-ACETAMINOPHEN 5-325 MG PO TABS
1.0000 | ORAL_TABLET | ORAL | Status: DC | PRN
  Administered 2013-05-28: 2 via ORAL
  Filled 2013-05-27: qty 2

## 2013-05-27 MED ORDER — PROMETHAZINE HCL 25 MG/ML IJ SOLN: 6.2500 mg | INTRAMUSCULAR | Status: DC | PRN

## 2013-05-27 MED ORDER — HYDROMORPHONE HCL PF 1 MG/ML IJ SOLN
INTRAMUSCULAR | Status: DC | PRN
  Administered 2013-05-27 (×2): 1 mg via INTRAVENOUS

## 2013-05-27 MED ORDER — SERTRALINE HCL 100 MG PO TABS
100.0000 mg | ORAL_TABLET | Freq: Every evening | ORAL | Status: DC
  Filled 2013-05-27: qty 1

## 2013-05-27 MED ORDER — PROPOFOL 10 MG/ML IV BOLUS
INTRAVENOUS | Status: AC
  Filled 2013-05-27: qty 20

## 2013-05-27 MED ORDER — LACTATED RINGERS IV SOLN: INTRAVENOUS | Status: DC

## 2013-05-27 MED ORDER — FENTANYL CITRATE 0.05 MG/ML IJ SOLN
INTRAMUSCULAR | Status: DC | PRN
  Administered 2013-05-27: 100 ug via INTRAVENOUS
  Administered 2013-05-27 (×3): 50 ug via INTRAVENOUS

## 2013-05-27 MED ORDER — HYDROMORPHONE HCL PF 2 MG/ML IJ SOLN
INTRAMUSCULAR | Status: AC
  Filled 2013-05-27: qty 1

## 2013-05-27 MED ORDER — MIDAZOLAM HCL 5 MG/5ML IJ SOLN
INTRAMUSCULAR | Status: DC | PRN
  Administered 2013-05-27: 2 mg via INTRAVENOUS

## 2013-05-27 MED ORDER — OXYCODONE HCL 5 MG/5ML PO SOLN
5.0000 mg | Freq: Once | ORAL | Status: DC | PRN
  Filled 2013-05-27: qty 5

## 2013-05-27 MED ORDER — BUPIVACAINE-EPINEPHRINE 0.5% -1:200000 IJ SOLN
INTRAMUSCULAR | Status: DC | PRN
  Administered 2013-05-27: 20 mL

## 2013-05-27 MED ORDER — DEXAMETHASONE SODIUM PHOSPHATE 10 MG/ML IJ SOLN
INTRAMUSCULAR | Status: DC | PRN
  Administered 2013-05-27: 10 mg via INTRAVENOUS

## 2013-05-27 MED ORDER — ACETAMINOPHEN 325 MG PO TABS: 650.0000 mg | ORAL_TABLET | ORAL | Status: DC | PRN

## 2013-05-27 MED ORDER — GLYCOPYRROLATE 0.2 MG/ML IJ SOLN
INTRAMUSCULAR | Status: AC
  Filled 2013-05-27: qty 3

## 2013-05-27 MED ORDER — CLONAZEPAM 1 MG PO TABS: 1.0000 mg | ORAL_TABLET | Freq: Every evening | ORAL | Status: DC | PRN

## 2013-05-27 MED ORDER — CEFAZOLIN SODIUM-DEXTROSE 2-3 GM-% IV SOLR
INTRAVENOUS | Status: AC
  Filled 2013-05-27: qty 50

## 2013-05-27 MED ORDER — NEOSTIGMINE METHYLSULFATE 1 MG/ML IJ SOLN
INTRAMUSCULAR | Status: DC | PRN
  Administered 2013-05-27: 4 mg via INTRAVENOUS

## 2013-05-27 MED ORDER — BUPIVACAINE-EPINEPHRINE PF 0.5-1:200000 % IJ SOLN
INTRAMUSCULAR | Status: AC
  Filled 2013-05-27: qty 30

## 2013-05-27 MED ORDER — HYDROMORPHONE HCL PF 1 MG/ML IJ SOLN
0.2500 mg | INTRAMUSCULAR | Status: DC | PRN
  Administered 2013-05-27 (×2): 0.25 mg via INTRAVENOUS

## 2013-05-27 MED ORDER — PROPOFOL 10 MG/ML IV BOLUS
INTRAVENOUS | Status: DC | PRN
  Administered 2013-05-27: 180 mg via INTRAVENOUS

## 2013-05-27 MED ORDER — DEXAMETHASONE SODIUM PHOSPHATE 10 MG/ML IJ SOLN
INTRAMUSCULAR | Status: AC
  Filled 2013-05-27: qty 1

## 2013-05-27 MED ORDER — CEFAZOLIN SODIUM-DEXTROSE 2-3 GM-% IV SOLR
2.0000 g | INTRAVENOUS | Status: AC
  Administered 2013-05-27: 2 g via INTRAVENOUS

## 2013-05-27 MED ORDER — SUCCINYLCHOLINE CHLORIDE 20 MG/ML IJ SOLN
INTRAMUSCULAR | Status: DC | PRN
  Administered 2013-05-27: 100 mg via INTRAVENOUS

## 2013-05-27 MED ORDER — ROCURONIUM BROMIDE 100 MG/10ML IV SOLN
INTRAVENOUS | Status: DC | PRN
  Administered 2013-05-27: 10 mg via INTRAVENOUS
  Administered 2013-05-27: 25 mg via INTRAVENOUS
  Administered 2013-05-27: 5 mg via INTRAVENOUS

## 2013-05-27 MED ORDER — LIDOCAINE HCL (CARDIAC) 20 MG/ML IV SOLN
INTRAVENOUS | Status: DC | PRN
  Administered 2013-05-27: 50 mg via INTRAVENOUS

## 2013-05-27 MED ORDER — ROCURONIUM BROMIDE 100 MG/10ML IV SOLN
INTRAVENOUS | Status: AC
  Filled 2013-05-27: qty 1

## 2013-05-27 MED ORDER — MEPERIDINE HCL 50 MG/ML IJ SOLN: 6.2500 mg | INTRAMUSCULAR | Status: DC | PRN

## 2013-05-27 MED ORDER — LACTATED RINGERS IR SOLN
Status: DC | PRN
  Administered 2013-05-27: 200 mL
  Administered 2013-05-27: 400 mL

## 2013-05-27 MED ORDER — SUMATRIPTAN SUCCINATE 50 MG PO TABS
50.0000 mg | ORAL_TABLET | Freq: Once | ORAL | Status: AC | PRN
  Filled 2013-05-27: qty 1

## 2013-05-27 MED ORDER — HYDROMORPHONE HCL PF 1 MG/ML IJ SOLN
1.0000 mg | INTRAMUSCULAR | Status: DC | PRN
  Administered 2013-05-27 – 2013-05-28 (×3): 1 mg via INTRAVENOUS
  Filled 2013-05-27 (×3): qty 1

## 2013-05-27 MED ORDER — ONDANSETRON HCL 4 MG/2ML IJ SOLN
INTRAMUSCULAR | Status: AC
  Filled 2013-05-27: qty 2

## 2013-05-27 MED ORDER — MIDAZOLAM HCL 2 MG/2ML IJ SOLN
INTRAMUSCULAR | Status: AC
  Filled 2013-05-27: qty 2

## 2013-05-27 MED ORDER — TOPIRAMATE 25 MG PO TABS
50.0000 mg | ORAL_TABLET | Freq: Two times a day (BID) | ORAL | Status: DC
  Filled 2013-05-27 (×2): qty 2

## 2013-05-27 MED ORDER — IOHEXOL 300 MG/ML  SOLN
INTRAMUSCULAR | Status: DC | PRN
  Administered 2013-05-27: 15 mL via INTRAVENOUS

## 2013-05-27 MED ORDER — ONDANSETRON HCL 4 MG/2ML IJ SOLN: 4.0000 mg | Freq: Four times a day (QID) | INTRAMUSCULAR | Status: DC | PRN

## 2013-05-27 MED ORDER — ONDANSETRON HCL 4 MG PO TABS: 4.0000 mg | ORAL_TABLET | Freq: Four times a day (QID) | ORAL | Status: DC | PRN

## 2013-05-27 MED ORDER — LIDOCAINE HCL (CARDIAC) 20 MG/ML IV SOLN
INTRAVENOUS | Status: AC
  Filled 2013-05-27: qty 5

## 2013-05-27 SURGICAL SUPPLY — 37 items
APPLIER CLIP ROT 10 11.4 M/L (STAPLE) ×3
BAG SPEC RTRVL LRG 6X4 10 (ENDOMECHANICALS) ×1
BENZOIN TINCTURE PRP APPL 2/3 (GAUZE/BANDAGES/DRESSINGS) ×3 IMPLANT
CABLE HIGH FREQUENCY MONO STRZ (ELECTRODE) ×3 IMPLANT
CANISTER SUCTION 2500CC (MISCELLANEOUS) IMPLANT
CHLORAPREP W/TINT 26ML (MISCELLANEOUS) ×3 IMPLANT
CLIP APPLIE ROT 10 11.4 M/L (STAPLE) ×1 IMPLANT
CLOSURE WOUND 1/2 X4 (GAUZE/BANDAGES/DRESSINGS) ×1
COVER MAYO STAND STRL (DRAPES) ×3 IMPLANT
DECANTER SPIKE VIAL GLASS SM (MISCELLANEOUS) IMPLANT
DRAPE C-ARM 42X120 X-RAY (DRAPES) ×3 IMPLANT
DRAPE LAPAROSCOPIC ABDOMINAL (DRAPES) ×3 IMPLANT
DRAPE UTILITY XL STRL (DRAPES) ×3 IMPLANT
ELECT REM PT RETURN 9FT ADLT (ELECTROSURGICAL) ×3
ELECTRODE REM PT RTRN 9FT ADLT (ELECTROSURGICAL) ×1 IMPLANT
GLOVE SURG ORTHO 8.0 STRL STRW (GLOVE) ×3 IMPLANT
GOWN STRL REUS W/TWL XL LVL3 (GOWN DISPOSABLE) ×6 IMPLANT
HEMOSTAT SURGICEL 4X8 (HEMOSTASIS) IMPLANT
IV LACTATED RINGERS 1000ML (IV SOLUTION) ×3 IMPLANT
KIT BASIN OR (CUSTOM PROCEDURE TRAY) ×3 IMPLANT
MANIFOLD NEPTUNE II (INSTRUMENTS) ×3 IMPLANT
NS IRRIG 1000ML POUR BTL (IV SOLUTION) ×3 IMPLANT
POUCH SPECIMEN RETRIEVAL 10MM (ENDOMECHANICALS) ×3 IMPLANT
SCISSORS LAP 5X35 DISP (ENDOMECHANICALS) ×3 IMPLANT
SET CHOLANGIOGRAPH MIX (MISCELLANEOUS) ×3 IMPLANT
SET IRRIG TUBING LAPAROSCOPIC (IRRIGATION / IRRIGATOR) ×3 IMPLANT
SLEEVE XCEL OPT CAN 5 100 (ENDOMECHANICALS) ×3 IMPLANT
SOLUTION ANTI FOG 6CC (MISCELLANEOUS) ×3 IMPLANT
STRIP CLOSURE SKIN 1/2X4 (GAUZE/BANDAGES/DRESSINGS) ×2 IMPLANT
SUT MNCRL AB 4-0 PS2 18 (SUTURE) ×3 IMPLANT
TOWEL OR 17X26 10 PK STRL BLUE (TOWEL DISPOSABLE) ×3 IMPLANT
TOWEL OR NON WOVEN STRL DISP B (DISPOSABLE) ×3 IMPLANT
TRAY LAP CHOLE (CUSTOM PROCEDURE TRAY) ×3 IMPLANT
TROCAR BLADELESS OPT 5 100 (ENDOMECHANICALS) ×3 IMPLANT
TROCAR XCEL BLUNT TIP 100MML (ENDOMECHANICALS) ×3 IMPLANT
TROCAR XCEL NON-BLD 11X100MML (ENDOMECHANICALS) ×3 IMPLANT
TUBING INSUFFLATION 10FT LAP (TUBING) ×3 IMPLANT

## 2013-05-27 NOTE — Anesthesia Preprocedure Evaluation (Addendum)
Anesthesia Evaluation  Patient identified by MRN, date of birth, ID band Patient awake    Reviewed: Allergy & Precautions, H&P , NPO status , Patient's Chart, lab work & pertinent test results  History of Anesthesia Complications (+) PONV  Airway Mallampati: III TM Distance: >3 FB Neck ROM: Full    Dental  (+) Dental Advisory Given, Missing, Chipped Missing left upper front tooth.  Lower front teeth are in bad shape.:   Pulmonary asthma , former smoker,  + rhonchi         Cardiovascular negative cardio ROS  Rhythm:Regular Rate:Normal     Neuro/Psych  Headaches, PSYCHIATRIC DISORDERS Anxiety Depression    GI/Hepatic negative GI ROS, Neg liver ROS,   Endo/Other  negative endocrine ROS  Renal/GU negative Renal ROS     Musculoskeletal negative musculoskeletal ROS (+)   Abdominal (+) + obese,   Peds  Hematology negative hematology ROS (+)   Anesthesia Other Findings   Reproductive/Obstetrics negative OB ROS                         Anesthesia Physical Anesthesia Plan  ASA: II  Anesthesia Plan: General   Post-op Pain Management:    Induction: Intravenous  Airway Management Planned: Oral ETT  Additional Equipment:   Intra-op Plan:   Post-operative Plan: Extubation in OR  Informed Consent: I have reviewed the patients History and Physical, chart, labs and discussed the procedure including the risks, benefits and alternatives for the proposed anesthesia with the patient or authorized representative who has indicated his/her understanding and acceptance.   Dental advisory given  Plan Discussed with: CRNA  Anesthesia Plan Comments:         Anesthesia Quick Evaluation

## 2013-05-27 NOTE — Interval H&P Note (Signed)
History and Physical Interval Note:  05/27/2013 3:07 PM  Angelica Diaz  has presented today for surgery, with the diagnosis of cholecystis.  The various methods of treatment have been discussed with the patient and family. After consideration of risks, benefits and other options for treatment, the patient has consented to    Procedure(s): LAPAROSCOPIC CHOLECYSTECTOMY WITH INTRAOPERATIVE CHOLANGIOGRAM (N/A) as a surgical intervention .    The patient's history has been reviewed, patient examined, no change in status, stable for surgery.  I have reviewed the patient's chart and labs.  Questions were answered to the patient's satisfaction.    Earnstine Regal, MD, [facility] Office: [place]

## 2013-05-27 NOTE — H&P (View-Only) (Signed)
General Surgery [facility]  Chief Complaint  Patient presents with  . New Evaluation    abdominal pain, biliary dyskinesia - referral from Dr. Erskine Emery    HISTORY: The patient is a 33 year old female referred by her gastroenterologist for evaluation of biliary dyskinesia. Patient has a 7 year history of intermittent right upper quadrant abdominal pain radiating to the back. She has had multiple episodes. Episodes may be brief or may last for several days. If she experiences nausea and occasional emesis. She denies fevers or chills. She denies jaundice or acholic stools. She has had no previous hepatobiliary disease. Her only previous abdominal surgery was cesarean section.  Patient underwent abdominal ultrasound which shows no gallstones and no abnormality of the biliary tract. A hepatobiliary scan documented biliary dyskinesia with an ejection fraction at 60 minutes of 16%.  There is a family history of gallbladder disease and multiple family members including her mother who underwent cholecystectomy.  Past Medical History  Diagnosis Date  . Depression   . Headache(784.0)   . Anxiety   . Sleep disorder   . Migraine   . Arthritis     Current Outpatient Prescriptions  Medication Sig Dispense Refill  . clonazePAM (KLONOPIN) 0.5 MG tablet take 1 to 2 tablets by mouth at bedtime if needed  180 tablet  1  . flurbiprofen (ANSAID) 50 MG tablet Take 1 tablet (50 mg total) by mouth 2 (two) times daily. Take 1 tablet twice daily as needed, no more than twice per week.  20 tablet  3  . sertraline (ZOLOFT) 100 MG tablet Take 50 mg by mouth daily.      . SUMAtriptan (IMITREX) 50 MG tablet Take 1 tablet (50 mg total) by mouth once as needed for migraine. May repeat in 2 hours if headache persists or recurs.  9 tablet  2  . topiramate (TOPAMAX) 50 MG tablet Take 1 tablet (50 mg total) by mouth 2 (two) times daily.  30 tablet  6   No current facility-administered  medications for this visit.    No Known Allergies  Family History  Problem Relation Age of Onset  . Diabetes Maternal Grandfather   . Migraines Mother   . Migraines Son     History   Social History  . Marital Status: Single    Spouse Name: N/A    Number of Children: 2  . Years of Education: N/A   Occupational History  . Insurance agent    Social History Main Topics  . Smoking status: Former Smoker -- 0.50 packs/day for 18 years    Types: Cigarettes    Quit date: 03/04/2013  . Smokeless tobacco: Never Used  . Alcohol Use: No     Comment: Occasionally  . Drug Use: No  . Sexual Activity: None   Other Topics Concern  . None   Social History Narrative   Currently sells insurance.   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  She has two healthy children.    REVIEW OF SYSTEMS - PERTINENT POSITIVES ONLY: Denies jaundice. Denies acholic stools. Nausea with episodes of pain. Occasional emesis.  EXAM: Filed Vitals:   05/25/13 0958  BP: 124/81  Pulse: 74  Temp: 97.8 F (36.6 C)  Resp: 18    GENERAL: well-developed, well-nourished, no acute distress HEENT: normocephalic; pupils equal and reactive; sclerae clear; dentition good; mucous membranes moist NECK:  No palpable masses in the thyroid bedsymmetric on extension; no palpable anterior or posterior cervical lymphadenopathy; no supraclavicular masses; no  tenderness CHEST: clear to auscultation bilaterally without rales, rhonchi, or wheezes CARDIAC: regular rate and rhythm without significant murmur; peripheral pulses are full ABDOMEN: soft without distension; bowel sounds present; no mass; no hepatosplenomegaly; no hernia EXT:  non-tender without edema; no deformity NEURO: no gross focal deficits; no sign of tremor   LABORATORY RESULTS: See [facility] (CHL-Epic) for most recent results  RADIOLOGY RESULTS: See [facility] (CHL-Epic) for most recent results  IMPRESSION: Abdominal pain, likely biliary  dyskinesia  PLAN: The patient and I discussed the above findings at length. We reviewed her ultrasound and her hepatobiliary scan. Liver function test are normal.  We discussed laparoscopic cholecystectomy with intraoperative cholangiography. We discussed risk and benefits of the procedure including the potential for conversion to open surgery. We discussed the likelihood of symptom relief been on the order of 70% success. We discussed the hospital stay and her postoperative recovery and return to work. She understands and wishes to proceed. We will make arrangements for surgery in the near future.  The risks and benefits of the procedure have been discussed at length with the patient.  The patient understands the proposed procedure, potential alternative treatments, and the course of recovery to be expected.  All of the patient's questions have been answered at this time.  The patient wishes to proceed with surgery.  Earnstine Regal, MD, [facility]  Primary Care Physician: Joycelyn Man, MD

## 2013-05-27 NOTE — Anesthesia Postprocedure Evaluation (Signed)
  Anesthesia Post-op Note  Patient: Angelica Diaz  Procedure(s) Performed: Procedure(s) (LRB): LAPAROSCOPIC CHOLECYSTECTOMY WITH INTRAOPERATIVE CHOLANGIOGRAM (N/A)  Patient Location: PACU  Anesthesia Type: General  Level of Consciousness: awake and alert   Airway and Oxygen Therapy: Patient Spontanous Breathing  Post-op Pain: mild  Post-op Assessment: Post-op Vital signs reviewed, Patient's Cardiovascular Status Stable, Respiratory Function Stable, Patent Airway and No signs of Nausea or vomiting  Last Vitals:  Filed Vitals:   05/27/13 1628  BP: 133/92  Pulse: 88  Temp: 37.1 C  Resp: 16    Post-op Vital Signs: stable   Complications: No apparent anesthesia complications

## 2013-05-27 NOTE — Op Note (Signed)
Procedure Note  Pre-operative Diagnosis:  Biliary dyskinesia, abdominal pain  Post-operative Diagnosis:  same  Surgeon:  Earnstine Regal, MD, FACS  Assistant:  none   Procedure:  Laparoscopic cholecystectomy with intra-operative cholangiography  Anesthesia:  General  Estimated Blood Loss:  minimal  Drains: none         Specimen: Gallbladder to pathology  Indications:  Patient is a 33 yo WF with a long history of abdominal pain, nausea, and occasional emesis.  HIDA scan with low EF of 16%.  USN negative for stones.  Now for lap chole with IOC.  Procedure Details:  The patient was seen in the pre-op holding area. The risks, benefits, complications, treatment options, and expected outcomes have been discussed with the patient. The patient agreed with the proposed plan and signed the informed consent form.  The patient was taken to Operating Room, identified as Angelica Diaz and the procedure verified as Laparoscopic Cholecystectomy with Intraoperative Cholangiogram. A "time out" was completed and the above information confirmed.  Following induction of general anesthesia, the patient was placed in the supine position. The abdomen was prepped and draped in the usual aseptic fashion.  An incision was made in the skin below the umbilicus. The midline fascia was incised and the peritoneal cavity entered and the Hasson canula was introduced under direct vision.  The Hasson canula was secured with a 0-Vicryl pursestring suture. Pneumoperitoneum was established with carbon dioxide. Additional trocars were introduced under direct vision along the right costal margin in the midline, mid-clavicular line, and anterior axillary line.   The gallbladder was identified and the fundus grasped and retracted cephalad. Adhesions were taken down bluntly and the electrocautery was utilized as needed, taking care not to injure any adjacent structures. The infundibulum was grasped and retracted laterally,  exposing the peritoneum overlying the triangle of Calot. The peritoneum was incised and structures exposed with blunt dissection. The cystic duct was clearly identified, bluntly dissected circumferentially, and clipped at the neck of the gallbladder.  An incision was made in the cystic duct and the cholangiogram catheter introduced. The catheter was secured using an ligaclip.  Real-time cholangiography was performed using C-arm fluoroscopy.  There was rapid filling of a normal caliber common bile duct.  There was reflux of contrast into the left and right hepatic ductal systems.  There was free flow distally into the duodenum without filling defect or obstruction.  Catheter was removed from the peritoneal cavity.  The cystic duct was then triply ligated with surgical clips and divided. The cystic artery was identified, dissected circumferentially, ligated with ligaclips, and divided.  The gallbladder was dissected away from the liver bed using the electrocautery for hemostasis. The gallbladder was completely removed from the liver and placed into an endocatch bag. The right upper quadrant was irrigated and the gallbladder bed was inspected. Hemostasis was achieved with the electrocautery. Warm saline irrigation was utilized until clear.  Pneumoperitoneum was released after viewing removal of the trocars with good hemostasis noted. The umbilical wound was irrigated and the fascia was then closed with the pursestring suture.  Local anesthetic was infiltrated at all port sites. The skin incisions were closed with 4-0 Monocril subcuticular sutures and steri-strips and dressings were applied.  Instrument, sponge, and needle counts were correct at the conclusion of the case.  The patient was awakened from anesthesia and brought to the recovery room in stable condition.  The patient tolerated the procedure well.   Earnstine Regal, MD, [facility] Office: [phone]

## 2013-05-27 NOTE — Discharge Instructions (Signed)
  [facility]  LAPAROSCOPIC SURGERY - POST-OP INSTRUCTIONS  Always review your discharge instruction sheet given to you by the facility where your surgery was performed.  A prescription for pain medication may be given to you upon discharge.  Take your pain medication as prescribed.  If narcotic pain medicine is not needed, then you may take acetaminophen (Tylenol) or ibuprofen (Advil) as needed.  Take your usually prescribed medications unless otherwise directed.  If you need a refill on your pain medication, please contact your pharmacy.  They will contact our office to request authorization. Prescriptions will not be filled after 5 P.M. or on weekends.  You should follow a light diet the first few days after arrival home, such as soup and crackers or toast.  Be sure to include plenty of fluids daily.  Most patients will experience some swelling and bruising in the area of the incisions.  Ice packs will help.  Swelling and bruising can take several days to resolve.   It is common to experience some constipation if taking pain medication after surgery.  Increasing fluid intake and taking a stool softener (such as Colace) will usually help or prevent this problem from occurring.  A mild laxative (Milk of Magnesia or Miralax) should be taken according to package instructions if there are no bowel movements after 48 hours.  Unless discharge instructions indicate otherwise, you may remove your bandages 24-48 hours after surgery, and you may shower at that time.  You may have steri-strips (small skin tapes) in place directly over the incision.  These strips should be left on the skin for 7-10 days.  If your surgeon used skin glue on the incision, you may shower in 24 hours.  The glue will flake off over the next 2-3 weeks.  Any sutures or staples will be removed at the office during your follow-up visit.  ACTIVITIES:  You may resume regular (light) daily activities beginning the  next day-such as daily self-care, walking, climbing stairs-gradually increasing activities as tolerated.  You may have sexual intercourse when it is comfortable.  Refrain from any heavy lifting or straining until approved by your doctor.  You may drive when you are no longer taking prescription pain medication, you can comfortably wear a seatbelt, and you can safely maneuver your car and apply brakes.  You should see your doctor in the office for a follow-up appointment approximately 2-3 weeks after your surgery.  Make sure that you call for this appointment within a day or two after you arrive home to insure a convenient appointment time.  WHEN TO CALL YOUR DOCTOR: 1. Fever over 101.0 2. Inability to urinate 3. Continued bleeding from incision 4. Increased pain, redness, or drainage from the incision 5. Increasing abdominal pain  The clinic staff is available to answer your questions during regular business hours.  Please don't hesitate to call and ask to speak to one of the nurses for clinical concerns.  If you have a medical emergency, go to the nearest emergency room or call 911.  A surgeon from [facility] is always on call for the hospital.  Manaia Samad M. Makynzie Dobesh, MD, FACS [facility] Office: [phone] Toll Free:  [phone] [phone]  Web site: [email or web address] 

## 2013-05-27 NOTE — Transfer of Care (Signed)
Immediate Anesthesia Transfer of Care Note  Patient: Angelica Diaz  Procedure(s) Performed: Procedure(s): LAPAROSCOPIC CHOLECYSTECTOMY WITH INTRAOPERATIVE CHOLANGIOGRAM (N/A)  Patient Location: PACU  Anesthesia Type:General  Level of Consciousness: awake, alert  and oriented  Airway & Oxygen Therapy: Patient Spontanous Breathing and Patient connected to face mask oxygen  Post-op Assessment: Report given to PACU RN and Post -op Vital signs reviewed and stable  Post vital signs: Reviewed and stable  Complications: No apparent anesthesia complications

## 2013-05-28 NOTE — Progress Notes (Signed)
Utilization Review Completed.   Orel Hord, RN, BSN Nurse Case Manager  

## 2013-05-28 NOTE — Progress Notes (Signed)
Patient ID: Angelica Diaz, female   DOB: 21-May-1980, 33 y.o.   MRN: [id number] 1 Day Post-Op  Subjective: Some incisional soreness/mild pain but otherwise doing okay without generalized abdominal pain or nausea. Eating breakfast.  Objective: Vital signs in last 24 hours: Temp:  [98.2 F (36.8 C)-98.8 F (37.1 C)] 98.7 F (37.1 C) (02/28 0622) Pulse Rate:  [66-88] 85 (02/28 0622) Resp:  [12-18] 18 (02/28 0622) BP: (104-133)/(67-92) 129/78 mmHg (02/28 0622) SpO2:  [92 %-100 %] 95 % (02/28 0622) Weight:  [208 lb 8 oz (94.575 kg)] 208 lb 8 oz (94.575 kg) (02/27 1839) Last BM Date: 05/27/13 (pre-operatively)  Intake/Output from previous day: 02/27 0701 - 02/28 0700 In: 2840 [P.O.:840; I.V.:2000] Out: 450 [Urine:450] Intake/Output this shift:    General appearance: alert, cooperative and no distress GI: appropriate tenderness around the incisions only Incision/Wound: dressings clean and dry  Lab Results:   Recent Labs  05/27/13 1240  WBC 9.5  HGB 14.2  HCT 42.5  PLT 258   BMET No results found for this basename: NA, K, CL, CO2, GLUCOSE, BUN, CREATININE, CALCIUM,  in the last 72 hours   Studies/Results: Dg Cholangiogram Operative  05/27/2013   CLINICAL DATA:  Cholelithiasis  EXAM: INTRAOPERATIVE CHOLANGIOGRAM  TECHNIQUE: Cholangiographic images from the C-arm fluoroscopic device were submitted for interpretation post-operatively. Please see the procedural report for the amount of contrast and the fluoroscopy time utilized.  COMPARISON:  None.  FINDINGS: No persistent filling defects in the common duct. Intrahepatic ducts are incompletely visualized, appearing decompressed centrally. Contrast passes into the duodenum.  : Negative for retained common duct stone.   Electronically Signed   By: Arne Cleveland M.D.   On: 05/27/2013 16:22    Anti-infectives: Anti-infectives   Start     Dose/Rate Route Frequency Ordered Stop   05/28/13 0600  ceFAZolin (ANCEF) IVPB 2 g/50 mL  premix     2 g 100 mL/hr over 30 Minutes Intravenous On call to O.R. 05/27/13 1229 05/27/13 1525      Assessment/Plan: s/p Procedure(s): LAPAROSCOPIC CHOLECYSTECTOMY WITH INTRAOPERATIVE CHOLANGIOGRAM Doing well without apparent complication. Okay for discharge.   LOS: 1 day    [name] T 05/28/2013

## 2013-05-30 ENCOUNTER — Telehealth (INDEPENDENT_AMBULATORY_CARE_PROVIDER_SITE_OTHER): Payer: Self-pay

## 2013-05-30 ENCOUNTER — Encounter (INDEPENDENT_AMBULATORY_CARE_PROVIDER_SITE_OTHER): Payer: Self-pay | Admitting: Surgery

## 2013-05-30 ENCOUNTER — Encounter (HOSPITAL_COMMUNITY): Payer: Self-pay | Admitting: Surgery

## 2013-05-30 NOTE — Telephone Encounter (Signed)
Patient called back and was given below message.  Patient states understanding that her Po appt is on 06/22/13 to arrive at 1145a.  Patient will call back because she may want to go back before the 12th.  Patient aware she can go back before the 12th however no push, pulling, or lifting.

## 2013-05-30 NOTE — Discharge Summary (Signed)
  Physician Discharge Summary [facility]  Patient ID: Angelica Diaz MRN: [id number] DOB/AGE: May 04, 1980 33 y.o.  Admit date: 05/27/2013 Discharge date: 05/30/2013  Admission Diagnoses:  Biliary dyskinesia, abdominal pain  Discharge Diagnoses:  Principal Problem:   Biliary dyskinesia s/p lap chole 05/27/2013 Active Problems:   Abdominal pain, right upper quadrant   Discharged Condition: good  Hospital Course: patient admitted for overnight observation following lap chole with IOC.  Post op course uncomplicated.  Prepared for discharge on POD#1.  Consults: None  Significant Diagnostic Studies: none  Treatments: surgery: lap chole with IOC  Discharge Exam: Blood pressure 129/78, pulse 85, temperature 98.7 F (37.1 C), temperature source Oral, resp. rate 18, height 5' 7.5" (1.715 m), weight 208 lb 8 oz (94.575 kg), SpO2 95.00%. See progress note from Dr. Excell Seltzer 05-28-2013  Disposition: Home with family  Discharge Orders   Future Appointments Provider Department Dept Phone   10/07/2013 3:30 PM Alda Berthold, MD [facility] [phone]   Future Orders Complete By Expires   Discharge patient  As directed        Medication List         clonazePAM 0.5 MG tablet  Commonly known as:  KLONOPIN  Take 1 mg by mouth at bedtime as needed for anxiety.     flurbiprofen 50 MG tablet  Commonly known as:  ANSAID  Take 50 mg by mouth 2 (two) times daily. Take 1 tablet twice daily as needed, no more than twice per week. Patient takes at nite if needed     HYDROcodone-acetaminophen 5-325 MG per tablet  Commonly known as:  NORCO/VICODIN  Take 1-2 tablets by mouth every 4 (four) hours as needed.     sertraline 100 MG tablet  Commonly known as:  ZOLOFT  Take 100 mg by mouth daily. Patient takes at nite     SUMAtriptan 50 MG tablet  Commonly known as:  IMITREX  Take 50 mg by mouth once as needed for migraine. May repeat in 2 hours if headache  persists or recurs.     topiramate 50 MG tablet  Commonly known as:  TOPAMAX  Take 50 mg by mouth 2 (two) times daily.           Follow-up Information   Follow up with Earnstine Regal, MD. Schedule an appointment as soon as possible for a visit in 3 weeks.   Specialty:  General Surgery   Contact information:   [address]  [place] [address] [phone]       Earnstine Regal, MD, [facility] Office: [phone]   Signed: Earnstine Regal 05/30/2013, 3:20 PM

## 2013-05-30 NOTE — Telephone Encounter (Signed)
LMOM for pt to call. Pt can be given po appt date in epic and can RTW on 06-09-13. We can make letter for pt if she needs one for work.

## 2013-06-01 ENCOUNTER — Telehealth (INDEPENDENT_AMBULATORY_CARE_PROVIDER_SITE_OTHER): Payer: Self-pay

## 2013-06-01 ENCOUNTER — Encounter (INDEPENDENT_AMBULATORY_CARE_PROVIDER_SITE_OTHER): Payer: Self-pay

## 2013-06-01 NOTE — Telephone Encounter (Signed)
RTW note mailed to pt.

## 2013-06-03 ENCOUNTER — Ambulatory Visit (INDEPENDENT_AMBULATORY_CARE_PROVIDER_SITE_OTHER): Payer: BC Managed Care – PPO | Admitting: General Surgery

## 2013-06-04 ENCOUNTER — Other Ambulatory Visit (INDEPENDENT_AMBULATORY_CARE_PROVIDER_SITE_OTHER): Payer: Self-pay | Admitting: Surgery

## 2013-06-22 ENCOUNTER — Ambulatory Visit (INDEPENDENT_AMBULATORY_CARE_PROVIDER_SITE_OTHER): Payer: BC Managed Care – PPO | Admitting: Surgery

## 2013-06-22 ENCOUNTER — Encounter (INDEPENDENT_AMBULATORY_CARE_PROVIDER_SITE_OTHER): Payer: Self-pay | Admitting: Surgery

## 2013-06-22 VITALS — BP 104/78 | HR 68 | Temp 98.8°F | Resp 14 | Ht 67.0 in | Wt 210.4 lb

## 2013-06-22 DIAGNOSIS — R1011 Right upper quadrant pain: Secondary | ICD-10-CM

## 2013-06-22 DIAGNOSIS — K828 Other specified diseases of gallbladder: Secondary | ICD-10-CM

## 2013-06-22 NOTE — Progress Notes (Signed)
General Surgery [facility]  Chief Complaint  Patient presents with  . Routine Post Op    lap chole 05/27/2013 for biliary dyskinesia    HISTORY: Patient is a 33 year old female referred by her primary care physician and gastroenterologist for cholecystectomy for treatment of biliary dyskinesia. Patient underwent surgery on 05/27/2013. Final pathology shows chronic cholecystitis. No stones were identified.  Postoperatively the patient has done well. Right upper quadrant abdominal pain has resolved. She is tolerating a regular diet. Patient does note persistent right flank pain and has had a history of nephrolithiasis in the past.  EXAM: Surgical wounds are well healed. No sign of infection. No sign of herniation. Right upper quadrant is soft and nontender.  IMPRESSION: Status post laparoscopic cholecystectomy for chronic cholecystitis  PLAN: Patient and I reviewed the pathology report. Overall she is doing well. She will begin applying topical creams to her incisions.  If right flank pain persists, the patient will contact her primary care physician for evaluation.  Patient will return for surgical care as needed.  Earnstine Regal, MD, [facility]   Visit Diagnoses: 1. Abdominal pain, right upper quadrant   2. Biliary dyskinesia s/p lap chole 05/27/2013

## 2013-06-22 NOTE — Patient Instructions (Signed)
  COCOA BUTTER & VITAMIN E CREAM  ([employer]mer[employer] or other brand)  Apply cocoa butter/vitamin E cream to your incision 2 - 3 times daily.  Massage cream into incision for one minute with each application.  Use sunscreen (50 SPF or higher) for first 6 months after surgery if area is exposed to sun.  You may substitute Mederma or other scar reducing creams as desired.   

## 2013-08-11 ENCOUNTER — Ambulatory Visit: Payer: BC Managed Care – PPO | Admitting: Family Medicine

## 2013-08-11 ENCOUNTER — Telehealth: Payer: Self-pay | Admitting: *Deleted

## 2013-08-11 NOTE — Telephone Encounter (Signed)
Patient schedule an appointment with Dr Sherren Mocha for abdominal pain after having gall bladder surgery.  The surgeon advised the patient to see here PCP.  Dr Sherren Mocha has advised the patient to go to GI.  Patient is aware and a call was placed to Dr Kelby Fam office.

## 2013-08-15 ENCOUNTER — Ambulatory Visit (INDEPENDENT_AMBULATORY_CARE_PROVIDER_SITE_OTHER): Payer: BC Managed Care – PPO | Admitting: Gastroenterology

## 2013-08-15 ENCOUNTER — Encounter: Payer: Self-pay | Admitting: Gastroenterology

## 2013-08-15 ENCOUNTER — Other Ambulatory Visit (INDEPENDENT_AMBULATORY_CARE_PROVIDER_SITE_OTHER): Payer: BC Managed Care – PPO

## 2013-08-15 VITALS — BP 102/70 | HR 76 | Ht 67.5 in | Wt 199.0 lb

## 2013-08-15 DIAGNOSIS — R1011 Right upper quadrant pain: Secondary | ICD-10-CM

## 2013-08-15 DIAGNOSIS — R109 Unspecified abdominal pain: Secondary | ICD-10-CM

## 2013-08-15 LAB — COMPREHENSIVE METABOLIC PANEL
ALBUMIN: 4 g/dL (ref 3.5–5.2)
ALT: 16 U/L (ref 0–35)
AST: 16 U/L (ref 0–37)
Alkaline Phosphatase: 74 U/L (ref 39–117)
BUN: 14 mg/dL (ref 6–23)
CALCIUM: 9.2 mg/dL (ref 8.4–10.5)
CHLORIDE: 108 meq/L (ref 96–112)
CO2: 23 mEq/L (ref 19–32)
Creatinine, Ser: 0.8 mg/dL (ref 0.4–1.2)
GFR: 90.8 mL/min (ref >60.00)
Glucose, Bld: 81 mg/dL (ref 70–99)
POTASSIUM: 4.3 meq/L (ref 3.5–5.1)
Sodium: 138 mEq/L (ref 135–145)
Total Bilirubin: 0.7 mg/dL (ref 0.2–1.2)
Total Protein: 7.1 g/dL (ref 6.0–8.3)

## 2013-08-15 LAB — CBC
HCT: 45.6 % (ref 36.0–46.0)
Hemoglobin: 15.3 g/dL — ABNORMAL HIGH (ref 12.0–15.0)
MCHC: 33.5 g/dL (ref 30.0–36.0)
MCV: 91.7 fl (ref 78.0–100.0)
Platelets: 238 10*3/uL (ref 150.0–400.0)
RBC: 4.97 Mil/uL (ref 3.87–5.11)
RDW: 13.1 % (ref 11.5–15.5)
WBC: 7.8 10*3/uL (ref 4.0–10.5)

## 2013-08-15 MED ORDER — OMEPRAZOLE 40 MG PO CPDR: 40.0000 mg | DELAYED_RELEASE_CAPSULE | Freq: Every day | ORAL | Status: DC

## 2013-08-15 NOTE — Patient Instructions (Addendum)
You have been scheduled for an abdominal ultrasound at [facility] (1st floor of hospital) on 08-17-2013 at 9 am. Please arrive 15 minutes prior to your appointment for registration. Make certain not to have anything to eat or drink 6 hours prior to your appointment. Should you need to reschedule your appointment, please contact radiology at [phone]. This test typically takes about 30 minutes to perform.  Your physician has requested that you go to the basement for the following lab work before leaving today: CBC CMET  We have sent the following medications to your pharmacy for you to pick up at your convenience: Omeprazole 40 mg, please take one capsule by mouth once daily   You have a follow up visit with [name] on 08-25-2013 at 130 pm.

## 2013-08-15 NOTE — Progress Notes (Signed)
08/15/2013 Angelica Diaz [id number] April 16, 1980   History of Present Illness:  This is a 33 year old female who underwent a laparoscopic cholecystectomy in February of this year for complaints of right upper quadrant abdominal pain.  Preop abdominal ultrasound was normal. During her cholecystectomy on February 27 she underwent an intraoperative cholangiogram, which was negative/normal.  She states that her initial right upper quadrant abdominal pain has resolved, however, now, since her surgery she has been experiencing pain in a different area, more on her right flank. She describes as a constant stinging/burning pain rated as a 7/10 on the pain scale. She states that it hurts to lay on her right side but hurts even more to lay on the left side and feels like something is pulling. Does not worsen/change with eating.  She denies any other associated symptoms including nausea, vomiting, heartburn/reflux/indigestion, or bowel issues.  Denies taking NSAID's; has flurbiprofen listed on her medication list, but she only takes this rarely for migraines and has not needed it in quite some time.  Current Medications, Allergies, Past Medical History, Past Surgical History, Family History and Social History were reviewed in [employer] record.   Physical Exam: BP 102/70  Pulse 76  Ht 5' 7.5" (1.715 m)  Wt 199 lb (90.266 kg)  BMI 30.69 kg/m2 General: Well developed white female in no acute distress Head: Normocephalic and atraumatic Eyes:  sclerae anicteric, conjunctiva pink  Ears: Normal auditory acuity Lungs: Clear throughout to auscultation Heart: Regular rate and rhythm Abdomen: Soft, non-distended.  Normal bowel sounds.  RUQ/right flank TTP. Musculoskeletal: Symmetrical with no gross deformities  Extremities: No edema  Neurological: Alert oriented x 4, grossly non-focal Psychological:  Alert and cooperative. Normal mood and affect  Assessment and  Recommendations: -RUQ/right flank pain:  Present only since cholecystectomy.  Does not sound like a GI problem.  ? Nerve damage during surgery vs musculoskeletal source.  Will recheck ultrasound of the abdomen.  Will check CBC and CMP.  Will try omeprazole 40 mg daily.  Return to office in 2-4 weeks.

## 2013-08-16 NOTE — Progress Notes (Signed)
Reviewed and agree with management. Robert D. Kaplan, M.D., FACG  

## 2013-08-17 ENCOUNTER — Ambulatory Visit (HOSPITAL_COMMUNITY)
Admission: RE | Admit: 2013-08-17 | Discharge: 2013-08-17 | Disposition: A | Discharge status: Home / Self Care | Payer: BC Managed Care – PPO | Source: Ambulatory Visit | Attending: Gastroenterology | Admitting: Gastroenterology

## 2013-08-17 ENCOUNTER — Telehealth: Payer: Self-pay | Admitting: *Deleted

## 2013-08-17 DIAGNOSIS — R109 Unspecified abdominal pain: Secondary | ICD-10-CM

## 2013-08-17 DIAGNOSIS — R1011 Right upper quadrant pain: Secondary | ICD-10-CM | POA: Insufficient documentation

## 2013-08-17 NOTE — Telephone Encounter (Signed)
Patient given results of labs as per Alonza Bogus, PA

## 2013-08-23 ENCOUNTER — Ambulatory Visit (INDEPENDENT_AMBULATORY_CARE_PROVIDER_SITE_OTHER): Payer: BC Managed Care – PPO | Admitting: Family Medicine

## 2013-08-23 ENCOUNTER — Encounter: Payer: Self-pay | Admitting: Family Medicine

## 2013-08-23 VITALS — BP 120/84 | Temp 98.2°F | Wt 200.0 lb

## 2013-08-23 DIAGNOSIS — R109 Unspecified abdominal pain: Secondary | ICD-10-CM

## 2013-08-23 DIAGNOSIS — R1011 Right upper quadrant pain: Secondary | ICD-10-CM

## 2013-08-23 MED ORDER — INDOMETHACIN 50 MG PO CAPS: 50.0000 mg | ORAL_CAPSULE | Freq: Two times a day (BID) | ORAL | Status: DC

## 2013-08-23 NOTE — Progress Notes (Signed)
   Subjective:    Patient ID: Angelica Diaz, female    DOB: [birth year], 33 y.o.   MRN: [id number]  HPI Angelica Diaz is a 33 year old female who comes in today for evaluation of right flank pain since her gallbladder removal  She had her gallbladder removed this spring and postop noticed some soreness in the right flank area. It's been present since that time. She states is constant sharp a 7 to a 4 sometimes wakes her up at night. History positional. There is nothing she can do to make it better or worse. She has no symptoms of any underlying disease. Specifically no fever chills nausea vomiting weight loss change in urinary habits etc.  She is followed by neurology for migraine headaches  We sent her to GI for evaluation they did an ultrasound which was reportedly normal.   Review of Systems Review of systems negative    Objective:   Physical Exam  Well-developed well-nourished female no acute distress vital signs stable she is afebrile examination the abdomen shows 3 scars from previous laparoscopic cholecystectomy. The abdomen is soft the bowel sounds are normal there is no tenderness no rebound no masses. There's palpable tenderness in the right flank area but no masses.      Assessment & Plan:  Right flank pain. Since her gallbladder removal............ it really sounds musculoskeletal. However I will refer her back to Dr. Eloisa Diaz who did her original surgery for reconsultation

## 2013-08-23 NOTE — Patient Instructions (Addendum)
Call you general surgeon today for reevaluation  Indomethacin 50 mg.......... one twice daily with food

## 2013-08-23 NOTE — Progress Notes (Signed)
Pre visit review using our clinic review tool, if applicable. No additional management support is needed unless otherwise documented below in the visit note. 

## 2013-08-25 ENCOUNTER — Ambulatory Visit: Payer: BC Managed Care – PPO | Admitting: Physician Assistant

## 2013-09-20 ENCOUNTER — Encounter: Payer: BC Managed Care – PPO | Admitting: Family Medicine

## 2013-10-07 ENCOUNTER — Encounter: Payer: Self-pay | Admitting: Neurology

## 2013-10-07 ENCOUNTER — Ambulatory Visit (INDEPENDENT_AMBULATORY_CARE_PROVIDER_SITE_OTHER): Payer: BC Managed Care – PPO | Admitting: Neurology

## 2013-10-07 VITALS — BP 100/70 | HR 75 | Ht 66.93 in | Wt 197.0 lb

## 2013-10-07 DIAGNOSIS — R519 Headache, unspecified: Secondary | ICD-10-CM

## 2013-10-07 DIAGNOSIS — G43109 Migraine with aura, not intractable, without status migrainosus: Secondary | ICD-10-CM

## 2013-10-07 DIAGNOSIS — R51 Headache: Secondary | ICD-10-CM

## 2013-10-07 NOTE — Patient Instructions (Addendum)
Start magnesium 400mg  daily Continue topamax 50mg  twice daily For severe headaches, take Ansaid 50mg  twice daily as needed with benadryl STOP SMOKING - you can do it!! Return to clinic in 83-months

## 2013-10-07 NOTE — Progress Notes (Signed)
[facility]  Follow-up Visit   Date: 10/07/2013    Angelica Diaz MRN: [id number] DOB: Jun 30, 1980   Interim History:e Angelica Diaz is a 33 y.o. year-old right-handed Caucasian female with history of anxiety and depression returning to the clinic for follow-up of migraines.  The patient was accompanied to the clinic by self.  She was last seen on the clinic on 04/08/2013.   History of present illness: Intial visit 12/10/2012:  Patient reports migraines started around the age of 59. As a teenager to the age of 23, they would occur once per week, but this has improved since then. Pain is characterized as throbbing pain all over her head and does not radiate into her arms or neck. Duration is 1-3 days and frequency is once every 4-6 weeks. She endorses photophobia, phonophobia, nausea, vomiting, and lack of appetite. No flashing lights, numbness/tingling, or weakness. Stress exacerbates symptoms. Usually, she takes ibuprofen and sleeps, which helps. Currently, pain is a 3/10 and when severe 10/10. She takes ibuprofen 4-8 pills about 2-3x per week. She has previously tried hydrocodone, reglan, and was given a prescription for zomig which she never filled.   On September 3, she was was celebrating her daughter's birthday and her entire face went completely numb. She had associated slurring of her speech and she broke out in a cold sweat. This lasted 20 minutes. She sat down, otherwise she would have passed out. About an hour later, she had the worst migraine she has ever encountered. She placed an ice pack on her head, took ibuprofen, and tried to lay down. Pain slowly resolved by the following afternoon. On September 10, she went to the emergency department because she still was not feeling back to her baseline and continued to have a dull holocephalic headache and new word-finding problems. She remembers talking and not being able to get the words out. She had CT brain, but  we do not have the results to review. She received a headache cocktail, without significant relief.  Follow-up 12/24/2012:  Started propranolol but developed vivid dreams.  MRI/A brain and carotids which did not show any abnormalities, except a small 73mm x 82mm x 67mm left frontal meningioma without mass effect. Extra and intracranial vessels were patent.     Follow-up 01/17/2013: She does get relief with Ansaid and is taking it 1-2 twice per week. She continues to have migraines daily, with pain over the entire headache (L >R) with migrainous features.  Started on TPM.  Follow-up 04/08/2012:  She has been on topamax 50mg  BID and has been tolerating it well.  She says that her daily headaches are resolved..  She has occasional tingling of her feet. She had a severe headache in early December and went to the emergency department where she received headache cocktail, but did not have significant benefit.  She attributes this to being tapered off Chantix.   UPDATE 7/10/2/015:  She is doing stable, still with tension headaches but has not treating them with any medications.  She is getting migraines about once per week and responds well to Ansaid. Imitrex 100 mg did not seem to have any effect on her migraines. She has tried Flexeril but paradoxically developed next discussed with it. She unfortunately picked up smoking again which she quit in 03/04/2013.  She is doing great with minimizing rescue medications and reports using them only once throughout the month.   Medications:  Current Outpatient Prescriptions on File Prior to Visit  Medication  Sig Dispense Refill  . flurbiprofen (ANSAID) 50 MG tablet Take 50 mg by mouth 2 (two) times daily. Take 1 tablet twice daily as needed, no more than twice per week. Patient takes at nite if needed      . sertraline (ZOLOFT) 100 MG tablet Take 200 mg by mouth daily.      Marland Kitchen topiramate (TOPAMAX) 50 MG tablet Take 50 mg by mouth 2 (two) times daily.      Marland Kitchen zolpidem  (AMBIEN) 10 MG tablet Take 10 mg by mouth at bedtime as needed for sleep.       No current facility-administered medications on file prior to visit.    Allergies: No Known Allergies   Review of Systems:  CONSTITUTIONAL: No fevers, chills, night sweats, or weight loss.   EYES: No visual changes or eye pain ENT: No hearing changes.  No history of nose bleeds.   RESPIRATORY: No cough, wheezing and shortness of breath.   CARDIOVASCULAR: Negative for chest pain, and palpitations.   GI: Negative for abdominal discomfort, blood in stools or black stools.  No recent change in bowel habits.   GU:  No history of incontinence.   MUSCLOSKELETAL: No history of joint pain or swelling.  No myalgias.   SKIN: Negative for lesions, rash, and itching.   HEMATOLOGY/ONCOLOGY: Negative for prolonged bleeding, bruising easily, and swollen nodes.   ENDOCRINE: Negative for cold or heat intolerance, polydipsia or goiter.   PSYCH:  +depression or anxiety symptoms.   NEURO: As Above.   Vital Signs:  BP 100/70  Pulse 75  Ht 5' 6.93" (1.7 m)  Wt 197 lb (89.359 kg)  BMI 30.92 kg/m2  SpO2 99% Pain Scale: 4 on a scale of 0-10    Neurological Exam: MENTAL STATUS:  Awake, alert, oriented x 3. Speech is not dysarthric.  CRANIAL NERVES:   Pupils equal round and reactive to light.  Normal conjugate, extra-ocular eye movements in all directions of gaze.  No nystagmus.  No ptosis.  Normal facial symmetry and movements.  Palate elevates symmetrically.    MOTOR:  Motor strength is 5/5 in all four extremities. Prominent tension over the cervical muscles.    COORDINATION/GAIT: Gait narrow based and stable. Tandem and stressed gait intact.   Data: MRI brain 12/20/2012: Subcentimeter left posterior frontal extra-axial mass lesion, likely incidental meningioma. Otherwise negative exam. MRA circle of willis and carotids 12/20/2012:  Patent vasculature   IMPRESSION: 1. Episodic migraine  - Preventative therapy with  topamax which she is tolerating well 2.  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 Depression   PLAN/RECOMMENDATIONS:  1. Preventative therapy   - Continue topamax 50mg  BID, discussed increasing the dose but patient is happy with her current regimen.  - Stop flexeril due to adverse effect (stiff neck)  - Start magnesium 400 mg daily 2. Rescue therapy   For severe headaches, take Ansaid 50mg  twice daily as needed with benadryl  No benefit with Imitrex 100 mg. Discussed alternative rescue therapies, but patient declined 3. Patient instructed not to take Ansaid, OR advil more than 9 days a month, which she is doing great with 4. Encouraged her to perform neck stretches and exercises daily 6. Encouraged tobacco cessation  7. Return to clinic in 56-months, or sooner as needed  The duration of this appointment visit was 25 minutes  of face-to-face time with the patient.  Greater than 50% of this time was spent in counseling, explanation of diagnosis, planning of further management, and coordination of care.   Thank you for allowing me to participate in patient's care.  If I can answer any additional questions, I would be pleased to do so.    Sincerely,    Amarilys Lyles K. Posey Pronto, DO

## 2013-12-09 ENCOUNTER — Telehealth: Payer: Self-pay | Admitting: Neurology

## 2013-12-09 NOTE — Telephone Encounter (Signed)
Patient went to orthopedic doctor today and they gave her rx for mobic and naproxen.  They told her to call you before filling it to make sure it is ok for her to take.

## 2013-12-09 NOTE — Telephone Encounter (Signed)
Patient notified

## 2013-12-09 NOTE — Telephone Encounter (Signed)
Pt called requesting to speak to a nurse regarding her meds. C/B [phone]

## 2013-12-09 NOTE — Telephone Encounter (Signed)
OK for her to take.  Donika K. Posey Pronto, DO

## 2013-12-13 ENCOUNTER — Other Ambulatory Visit: Payer: Self-pay | Admitting: Neurology

## 2013-12-14 ENCOUNTER — Other Ambulatory Visit: Payer: Self-pay | Admitting: *Deleted

## 2013-12-14 ENCOUNTER — Ambulatory Visit: Payer: BC Managed Care – PPO | Admitting: Physician Assistant

## 2013-12-14 MED ORDER — TOPIRAMATE 50 MG PO TABS: 50.0000 mg | ORAL_TABLET | Freq: Two times a day (BID) | ORAL | Status: DC

## 2013-12-14 NOTE — Telephone Encounter (Signed)
Rx sent 

## 2014-01-03 ENCOUNTER — Other Ambulatory Visit: Payer: Self-pay | Admitting: Family Medicine

## 2014-01-06 ENCOUNTER — Telehealth: Payer: Self-pay | Admitting: Family Medicine

## 2014-01-06 NOTE — Telephone Encounter (Signed)
Pt states her clonazePAM (KLONOPIN) 0.5 MG tablet Was filled by [facility] and she did not request this rx.  Pt would like this removed from her med list.

## 2014-01-09 NOTE — Telephone Encounter (Signed)
Medication removed from med list.

## 2014-01-30 ENCOUNTER — Encounter: Payer: Self-pay | Admitting: Neurology

## 2014-04-12 ENCOUNTER — Ambulatory Visit (INDEPENDENT_AMBULATORY_CARE_PROVIDER_SITE_OTHER): Payer: 59 | Admitting: Family Medicine

## 2014-04-12 VITALS — BP 120/84 | Temp 98.5°F | Wt 208.0 lb

## 2014-04-12 DIAGNOSIS — Z720 Tobacco use: Secondary | ICD-10-CM

## 2014-04-12 DIAGNOSIS — F17200 Nicotine dependence, unspecified, uncomplicated: Secondary | ICD-10-CM

## 2014-04-12 DIAGNOSIS — J4521 Mild intermittent asthma with (acute) exacerbation: Secondary | ICD-10-CM

## 2014-04-12 MED ORDER — VARENICLINE TARTRATE 1 MG PO TABS: 1.0000 mg | ORAL_TABLET | Freq: Two times a day (BID) | ORAL | Status: DC

## 2014-04-12 MED ORDER — HYDROCODONE-HOMATROPINE 5-1.5 MG/5ML PO SYRP: ORAL_SOLUTION | ORAL | Status: DC

## 2014-04-12 MED ORDER — PREDNISONE 20 MG PO TABS: ORAL_TABLET | ORAL | Status: DC

## 2014-04-12 NOTE — Progress Notes (Signed)
Pre visit review using our clinic review tool, if applicable. No additional management support is needed unless otherwise documented below in the visit note. 

## 2014-04-12 NOTE — Progress Notes (Signed)
   Subjective:    Patient ID: Angelica Diaz, female    DOB: 1981/01/07, 34 y.o.   MRN: [id number]  HPI Angelica Diaz  Is a 34 year old female smoker who comes in with a week's history of head congestion cough and now wheezing. She's had a history of asthma in the past. She states she's down to a couple cigarettes a day. She has Chantix at home but is not taking it.   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 Stop smoking completely.... Chantix........ Prednisone burst and taper........ Hydromet cough syrup

## 2014-04-12 NOTE — Patient Instructions (Signed)
Stop smoking completely  Chantix 1 mg.... One half tab daily for 7 days then one half tab twice daily.......... I would recommend you take a half a tablet of the Chantix twice daily for 3 months after you completely quit smoking see don't restart    prednisone 20 mg.......... 2 tabs 3 days taper as outlined   Hydromet.......Marland Kitchen 1/2 teaspoon at bedtime   Afrin nasal spray............ One shot up each nostril at bedtime for 5 nights then stop   Drink lots of water   Vaporizer

## 2014-04-14 ENCOUNTER — Ambulatory Visit: Payer: BC Managed Care – PPO | Admitting: Neurology

## 2014-04-28 ENCOUNTER — Encounter: Payer: Self-pay | Admitting: Neurology

## 2014-04-28 ENCOUNTER — Ambulatory Visit (INDEPENDENT_AMBULATORY_CARE_PROVIDER_SITE_OTHER): Payer: 59 | Admitting: Neurology

## 2014-04-28 VITALS — BP 110/78 | HR 82 | Ht 67.5 in | Wt 210.0 lb

## 2014-04-28 DIAGNOSIS — R519 Headache, unspecified: Secondary | ICD-10-CM

## 2014-04-28 DIAGNOSIS — G43109 Migraine with aura, not intractable, without status migrainosus: Secondary | ICD-10-CM

## 2014-04-28 DIAGNOSIS — Z79899 Other long term (current) drug therapy: Secondary | ICD-10-CM

## 2014-04-28 DIAGNOSIS — R51 Headache: Secondary | ICD-10-CM

## 2014-04-28 MED ORDER — NORTRIPTYLINE HCL 10 MG PO CAPS: ORAL_CAPSULE | ORAL | Status: DC

## 2014-04-28 NOTE — Patient Instructions (Signed)
1.  Start taking nortriptyline 10mg  at bedtime x 2 weeks, then increase to 2 tablets at bedtime. 2.  Please send me an update via MyChart in 1 month to let me know how you are doing.  We may consider decreasing topamax 3.  For severe headache, take benadryl 25mg  with Ansaid 50mg   4.  Consider adding magnesium oxide 400-600mg  daily 5.  Return to clinic in 3-months

## 2014-04-28 NOTE — Progress Notes (Signed)
Angelica Diaz  Follow-up Visit   Date: 04/28/2014    Angelica Diaz MRN: [id number] DOB: 1980/06/27   Interim History:Angelica Diaz is a 34 y.o. year-old right-handed Caucasian female with history of anxiety and depression returning to the clinic for follow-up of migraines and chronic daily headaches.  The patient was accompanied to the clinic by self.     History of present illness: Intial visit 12/10/2012:  Patient reports migraines started around the age of 62. As a teenager to the age of 32, they would occur once per week, but this has improved since then. Pain is characterized as throbbing pain all over her head and does not radiate into her arms or neck. Duration is 1-3 days and frequency is once every 4-6 weeks. She endorses photophobia, phonophobia, nausea, vomiting, and lack of appetite. No flashing lights, numbness/tingling, or weakness. Stress exacerbates symptoms. Usually, she takes ibuprofen and sleeps, which helps. Currently, pain is a 3/10 and when severe 10/10. She takes ibuprofen 4-8 pills about 2-3x per week. She has previously tried hydrocodone, reglan, and was given a prescription for zomig which she never filled.   On September 3, she was was celebrating her daughter's birthday and her entire face went completely numb. She had associated slurring of her speech and she broke out in a cold sweat. This lasted 20 minutes. She sat down, otherwise she would have passed out. About an hour later, she had the worst migraine she has ever encountered. She placed an ice pack on her head, took ibuprofen, and tried to lay down. Pain slowly resolved by the following afternoon. On September 10, she went to the emergency department because she still was not feeling back to her baseline and continued to have a dull holocephalic headache and new word-finding problems. She remembers talking and not being able to get the words out. She had CT brain, but we do not have  the results to review. She received a headache cocktail, without significant relief.  Follow-up 12/24/2012:  Started propranolol but developed vivid dreams.  MRI/A brain and carotids which did not show any abnormalities, except a small 56mm x 35mm x 79mm left frontal meningioma without mass effect. Extra and intracranial vessels were patent.     Follow-up 01/17/2013: She does get relief with Ansaid and is taking it 1-2 twice per week. She continues to have migraines daily, with pain over the entire headache (L >R) with migrainous features.  Started on TPM.  Follow-up 04/08/2012:  She has been on topamax 50mg  BID and has been tolerating it well.  She says that her daily headaches are resolved..  She has occasional tingling of her feet. She had a severe headache in early December and went to the emergency department where she received headache cocktail, but did not have significant benefit.  She attributes this to being tapered off Chantix.   Follow-up 7/10/2/015:  She is doing stable, still with tension headaches but has not treating them with any medications.  She is getting migraines about once per week and responds well to Ansaid. Imitrex 100 mg did not seem to have any effect on her migraines. She has tried Flexeril but paradoxically developed next discussed with it. She unfortunately picked up smoking again which she quit in 03/04/2013.  She is doing great with minimizing rescue medications and reports using them only once throughout the month.  - UPDATE 04/28/2014:  There is no new changes to her headache.  She continues to have  headaches about three times per week, lasting all day.  For severe headaches, Ansaid seems to help.  She is having paresthesias to the topamax and is not interested on increasing it.  She is interested in quitting smoking again with a quit date of 05/14/2014.    Medications:  Current Outpatient Prescriptions on File Prior to Visit  Medication Sig Dispense Refill  . flurbiprofen  (ANSAID) 50 MG tablet Take 50 mg by mouth 2 (two) times daily. Take 1 tablet twice daily as needed, no more than twice per week. Patient takes at nite if needed    . sertraline (ZOLOFT) 100 MG tablet Take 200 mg by mouth daily.    Marland Kitchen topiramate (TOPAMAX) 50 MG tablet take 1 tablet by mouth twice a day 30 tablet 6  . zolpidem (AMBIEN) 10 MG tablet Take 10 mg by mouth at bedtime as needed for sleep.     No current facility-administered medications on file prior to visit.    Allergies: No Known Allergies   Review of Systems:  CONSTITUTIONAL: No fevers, chills, night sweats, or weight loss.   EYES: No visual changes or eye pain ENT: No hearing changes.  No history of nose bleeds.   RESPIRATORY: No cough, wheezing and shortness of breath.   CARDIOVASCULAR: Negative for chest pain, and palpitations.   GI: Negative for abdominal discomfort, blood in stools or black stools.  No recent change in bowel habits.   GU:  No history of incontinence.   MUSCLOSKELETAL: No history of joint pain or swelling.  No myalgias.   SKIN: Negative for lesions, rash, and itching.   HEMATOLOGY/ONCOLOGY: Negative for prolonged bleeding, bruising easily, and swollen nodes.   ENDOCRINE: Negative for cold or heat intolerance, polydipsia or goiter.   PSYCH:  +depression or anxiety symptoms.   NEURO: As Above.   Vital Signs:  BP 110/78 mmHg  Pulse 82  Ht 5' 7.5" (1.715 m)  Wt 210 lb (95.255 kg)  BMI 32.39 kg/m2  SpO2 98% Pain Scale: 4 on a scale of 0-10    Neurological Exam: MENTAL STATUS:  Awake, alert, oriented x 3. Speech is not dysarthric.  CRANIAL NERVES:   Pupils equal round and reactive to light.  Normal conjugate, extra-ocular eye movements in all directions of gaze.  No nystagmus.  No ptosis.  Normal facial symmetry and movements.  Palate elevates symmetrically.    MOTOR:  Motor strength is 5/5 in all four extremities.   SENSATION:  Intact to vibration throughout  REFLEXES:  2+/4  throughout  COORDINATION/GAIT: Gait narrow based and stable. Tandem and stressed gait intact.   Data: MRI brain 12/20/2012: Subcentimeter left posterior frontal extra-axial mass lesion, likely incidental meningioma. Otherwise negative exam. MRA circle of willis and carotids 12/20/2012:  Patent vasculature   IMPRESSION: 1. Episodic migraine without aura  2.  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 Depression 4. Tobacco use   PLAN/RECOMMENDATIONS:  1.  Start taking nortriptyline 10mg  at bedtime x 2 weeks, then increase to 2 tablets at bedtime. 2.  Continue topamax 50mg  twice daily for now.  Going forward, if she does well with nortriptyline, consider tapering TPM 3.  For severe headache, take benadryl 25mg  with Ansaid 50mg .  Limit to  twice per week 4.  Consider adding magnesium oxide 400-600mg  daily 5.  Encouraged tobacco cessation, she is taking Chantix and is planning to quit on 2/14 6. Return to clinic in 84-months, or sooner as needed  The duration of this appointment visit was 25 minutes of face-to-face time with the patient.  Greater than 50% of this time was spent in counseling, explanation of diagnosis, planning of further management, and coordination of care.   Thank you for allowing me to participate in patient's care.  If I can answer any additional questions, I would be pleased to do so.    Sincerely,    Angelica Meditz K. Posey Pronto, DO

## 2014-06-08 ENCOUNTER — Telehealth: Payer: Self-pay | Admitting: Family Medicine

## 2014-06-08 MED ORDER — FLUCONAZOLE 150 MG PO TABS: ORAL_TABLET | ORAL | Status: DC

## 2014-06-08 NOTE — Telephone Encounter (Signed)
Pt said she has a terrible yeast infection and Dr Sherren Mocha has called her a rx that was 2 pills. She is asking if he will call her those pills in for her.     Pharmacy  [facility] in [place]

## 2014-06-08 NOTE — Telephone Encounter (Signed)
rx okay per Dr Sherren Mocha.  Sent to pharmacy.

## 2014-07-18 ENCOUNTER — Encounter: Payer: Self-pay | Admitting: Neurology

## 2014-07-28 ENCOUNTER — Other Ambulatory Visit: Payer: Self-pay | Admitting: Neurology

## 2014-07-28 ENCOUNTER — Other Ambulatory Visit (INDEPENDENT_AMBULATORY_CARE_PROVIDER_SITE_OTHER): Payer: 59

## 2014-07-28 DIAGNOSIS — F331 Major depressive disorder, recurrent, moderate: Secondary | ICD-10-CM | POA: Diagnosis not present

## 2014-07-28 LAB — FOLATE: FOLATE: 16.2 ng/mL (ref >5.9)

## 2014-07-28 LAB — VITAMIN D 25 HYDROXY (VIT D DEFICIENCY, FRACTURES): VITD: 11.76 ng/mL — ABNORMAL LOW (ref 30.00–100.00)

## 2014-07-28 LAB — VITAMIN B12: Vitamin B-12: 249 pg/mL (ref 211–911)

## 2014-07-28 LAB — TSH: TSH: 0.99 u[IU]/mL (ref 0.35–4.50)

## 2014-07-31 ENCOUNTER — Encounter: Payer: Self-pay | Admitting: Neurology

## 2014-07-31 ENCOUNTER — Other Ambulatory Visit: Payer: Self-pay | Admitting: *Deleted

## 2014-07-31 ENCOUNTER — Ambulatory Visit (INDEPENDENT_AMBULATORY_CARE_PROVIDER_SITE_OTHER): Payer: 59 | Admitting: Neurology

## 2014-07-31 VITALS — BP 110/80 | HR 72 | Ht 67.0 in | Wt 213.3 lb

## 2014-07-31 DIAGNOSIS — G43019 Migraine without aura, intractable, without status migrainosus: Secondary | ICD-10-CM | POA: Diagnosis not present

## 2014-07-31 DIAGNOSIS — G43709 Chronic migraine without aura, not intractable, without status migrainosus: Secondary | ICD-10-CM

## 2014-07-31 DIAGNOSIS — R51 Headache: Secondary | ICD-10-CM

## 2014-07-31 DIAGNOSIS — IMO0002 Reserved for concepts with insufficient information to code with codable children: Secondary | ICD-10-CM

## 2014-07-31 DIAGNOSIS — R519 Headache, unspecified: Secondary | ICD-10-CM

## 2014-07-31 MED ORDER — FLURBIPROFEN 50 MG PO TABS: ORAL_TABLET | ORAL | Status: DC

## 2014-07-31 MED ORDER — TOPIRAMATE ER 50 MG PO CAP24: 50.0000 mg | ORAL_CAPSULE | Freq: Every day | ORAL | Status: DC

## 2014-07-31 MED ORDER — TOPIRAMATE ER 100 MG PO CAP24: 100.0000 mg | ORAL_CAPSULE | Freq: Every day | ORAL | Status: DC

## 2014-07-31 MED ORDER — TOPIRAMATE ER 25 MG PO CAP24: ORAL_CAPSULE | ORAL | Status: DC

## 2014-07-31 NOTE — Progress Notes (Signed)
Angelica Diaz  Follow-up Visit   Date: 07/31/2014    Angelica Diaz MRN: [id number] DOB: 1980-11-28   Interim History:e Angelica Diaz is a 34 y.o. year-old right-handed Caucasian female with history of anxiety and depression returning to the clinic for follow-up of migraines and chronic daily headaches.  The patient was accompanied to the clinic by self.     History of present illness: Intial visit 12/10/2012:  Patient reports migraines started around the age of 1. As a teenager to the age of 13, they would occur once per week, but this has improved since then. Pain is characterized as throbbing pain all over her head and does not radiate into her arms or neck. Duration is 1-3 days and frequency is once every 4-6 weeks. She endorses photophobia, phonophobia, nausea, vomiting, and lack of appetite. No flashing lights, numbness/tingling, or weakness. Stress exacerbates symptoms. Usually, she takes ibuprofen and sleeps, which helps. Currently, pain is a 3/10 and when severe 10/10. She takes ibuprofen 4-8 pills about 2-3x per week. She has previously tried hydrocodone, reglan, and was given a prescription for zomig which she never filled.   On September 3, she was was celebrating her daughter's birthday and her entire face went completely numb. She had associated slurring of her speech and she broke out in a cold sweat. This lasted 20 minutes. She sat down, otherwise she would have passed out. About an hour later, she had the worst migraine she has ever encountered. She placed an ice pack on her head, took ibuprofen, and tried to lay down. Pain slowly resolved by the following afternoon. On September 10, she went to the emergency department because she still was not feeling back to her baseline and continued to have a dull holocephalic headache and new word-finding problems. She remembers talking and not being able to get the words out. She had CT brain, but we do not have  the results to review. She received a headache cocktail, without significant relief.  Follow-up 12/24/2012:  Started propranolol but developed vivid dreams.  MRI/A brain and carotids which did not show any abnormalities, except a small 28mm x 55mm x 60mm left frontal meningioma without mass effect. Extra and intracranial vessels were patent.     Follow-up 01/17/2013: She does get relief with Ansaid and is taking it 1-2 twice per week. She continues to have migraines daily, with pain over the entire headache (L >R) with migrainous features.  Started on TPM.  Follow-up 04/08/2012:  She has been on topamax 50mg  BID and has been tolerating it well.  She says that her daily headaches are resolved..  She has occasional tingling of her feet. She had a severe headache in early December and went to the emergency department where she received headache cocktail, but did not have significant benefit.  She attributes this to being tapered off Chantix.   Follow-up 7/10/2/015:  She is doing stable, still with tension headaches but has not treating them with any medications.  She is getting migraines about once per week and responds well to Ansaid. Imitrex 100 mg did not seem to have any effect on her migraines. She has tried Flexeril but paradoxically developed next discussed with it. She unfortunately picked up smoking again which she quit in 03/04/2013.  She is doing great with minimizing rescue medications and reports using them only once throughout the month.  - UPDATE 04/28/2014:  There is no new changes to her headache.  She continues to have  headaches about three times per week, lasting all day.  For severe headaches, Ansaid seems to help.  She is having paresthesias to the topamax and is not interested on increasing it.    - UPDATE 07/31/2014:  She is taking nortriptyline 20mg  qhs and unfortunately has not noticed any improvement in her headaches.  She continues to have headaches about 4 times per week, lasting all day.      Medications:  Current Outpatient Prescriptions on File Prior to Visit  Medication Sig Dispense Refill  . nortriptyline (PAMELOR) 10 MG capsule Take one tablet at bedtime x 2 weeks, then increase to 2 tablets at bedtime. 60 capsule 3  . sertraline (ZOLOFT) 100 MG tablet Take 200 mg by mouth daily.    Marland Kitchen zolpidem (AMBIEN) 10 MG tablet Take 10 mg by mouth at bedtime as needed for sleep.     No current facility-administered medications on file prior to visit.    Allergies: No Known Allergies   Review of Systems:  CONSTITUTIONAL: No fevers, chills, night sweats, or weight loss.   EYES: No visual changes or eye pain ENT: No hearing changes.  No history of nose bleeds.   RESPIRATORY: No cough, wheezing and shortness of breath.   CARDIOVASCULAR: Negative for chest pain, and palpitations.   GI: Negative for abdominal discomfort, blood in stools or black stools.  No recent change in bowel habits.   GU:  No history of incontinence.   MUSCLOSKELETAL: No history of joint pain or swelling.  No myalgias.   SKIN: Negative for lesions, rash, and itching.   HEMATOLOGY/ONCOLOGY: Negative for prolonged bleeding, bruising easily, and swollen nodes.   ENDOCRINE: Negative for cold or heat intolerance, polydipsia or goiter.   PSYCH:  +depression or anxiety symptoms.   NEURO: As Above.   Vital Signs:  BP 110/80 mmHg  Pulse 72  Ht 5\' 7"  (1.702 m)  Wt 213 lb 5 oz (96.758 kg)  BMI 33.40 kg/m2  SpO2 99%  Neurological Exam: MENTAL STATUS:  Awake, alert, oriented x 3. Speech is not dysarthric.  CRANIAL NERVES:   Pupils equal round and reactive to light.  Normal conjugate, extra-ocular eye movements in all directions of gaze.  No nystagmus.  No ptosis.  Normal facial symmetry and movements.  Palate elevates symmetrically.    MOTOR:  Motor strength is 5/5 in all four extremities.   COORDINATION/GAIT: Gait narrow based and stable. Tandem and stressed gait intact.   Data: MRI brain 12/20/2012:  Subcentimeter left posterior frontal extra-axial mass lesion, likely incidental meningioma. Otherwise negative exam. MRA circle of willis and carotids 12/20/2012:  Patent vasculature   IMPRESSION: 1. 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 Depression 4. Tobacco use   PLAN/RECOMMENDATIONS:  1.  Switch to Trokendi 100mg  XR daily, samples provided for 50mg  and 25mg  tablets to get started.  Uptitirate as needed 2.  Continue nortriptyline 20mg  at bedtime  3.  Start prior authorization for Botox.  Risks and benefits discussed, literature provided for patient to review 4.  For severe headache, take benadryl 25mg  with Ansaid  50mg .  Limit to twice per week.  Refills provided 5.  Encouraged tobacco cessation 6.  Return to clinic in 24-months, or sooner as needed  The duration of this appointment visit was 25 minutes of face-to-face time with the patient.  Greater than 50% of this time was spent in counseling, explanation of diagnosis, planning of further management, and coordination of care.   Thank you for allowing me to participate in patient's care.  If I can answer any additional questions, I would be pleased to do so.    Sincerely,    Vaughan Diaz K. Posey Pronto, DO

## 2014-07-31 NOTE — Patient Instructions (Addendum)
1.  Switch to Trokendi 100mg  XR daily, samples provided for 50mg  and 25mg  tablets to get started.  2.  Continue nortriptyline 20mg  at bedtime  3.  Start prior authorization for Botox, we will contact you when we get an approval 4.  Encouraged to stop smoking 5.  Return to clinic in 3 monthslo

## 2014-08-03 NOTE — Progress Notes (Signed)
botox auth submitted.

## 2014-08-08 ENCOUNTER — Telehealth: Payer: Self-pay | Admitting: *Deleted

## 2014-08-08 NOTE — Telephone Encounter (Signed)
PA: [id number]

## 2014-10-25 ENCOUNTER — Encounter: Payer: Self-pay | Admitting: Neurology

## 2014-10-25 ENCOUNTER — Ambulatory Visit (INDEPENDENT_AMBULATORY_CARE_PROVIDER_SITE_OTHER): Payer: 59 | Admitting: Neurology

## 2014-10-25 VITALS — BP 108/80 | HR 96 | Ht 67.0 in | Wt 222.4 lb

## 2014-10-25 DIAGNOSIS — G43019 Migraine without aura, intractable, without status migrainosus: Secondary | ICD-10-CM

## 2014-10-25 DIAGNOSIS — G43709 Chronic migraine without aura, not intractable, without status migrainosus: Secondary | ICD-10-CM

## 2014-10-25 DIAGNOSIS — IMO0002 Reserved for concepts with insufficient information to code with codable children: Secondary | ICD-10-CM

## 2014-10-25 MED ORDER — ELETRIPTAN HYDROBROMIDE 20 MG PO TABS: 20.0000 mg | ORAL_TABLET | ORAL | Status: DC | PRN

## 2014-10-25 NOTE — Progress Notes (Signed)
Angelica Diaz  Follow-up Visit   Date: 10/25/2014    Angelica Diaz MRN: [id number] DOB: 1981-01-24   Interim History:e Angelica Diaz is a 34 y.o. year-old right-handed Caucasian female with history of anxiety and depression returning to the clinic for follow-up of migraines and chronic daily headaches.  The patient was accompanied to the clinic by self.     History of present illness: Intial visit 12/10/2012:  Patient reports migraines started around the age of 83. As a teenager to the age of 24, they would occur once per week, but this has improved since then. Pain is characterized as throbbing pain all over her head and does not radiate into her arms or neck. Duration is 1-3 days and frequency is once every 4-6 weeks. She endorses photophobia, phonophobia, nausea, vomiting, and lack of appetite. No flashing lights, numbness/tingling, or weakness. Stress exacerbates symptoms. Usually, she takes ibuprofen and sleeps, which helps. Currently, pain is a 3/10 and when severe 10/10. She takes ibuprofen 4-8 pills about 2-3x per week. She has previously tried hydrocodone, reglan, and was given a prescription for zomig which she never filled.   On September 3, she was was celebrating her daughter's birthday and her entire face went completely numb. She had associated slurring of her speech and she broke out in a cold sweat. This lasted 20 minutes. She sat down, otherwise she would have passed out. About an hour later, she had the worst migraine she has ever encountered. She placed an ice pack on her head, took ibuprofen, and tried to lay down. Pain slowly resolved by the following afternoon. On September 10, she went to the emergency department because she still was not feeling back to her baseline and continued to have a dull holocephalic headache and new word-finding problems. She remembers talking and not being able to get the words out. She had CT brain, but we do not have  the results to review. She received a headache cocktail, without significant relief.  Follow-up 12/24/2012:  Started propranolol but developed vivid dreams.  MRI/A brain and carotids which did not show any abnormalities, except a small 76mm x 49mm x 25mm left frontal meningioma without mass effect. Extra and intracranial vessels were patent.     Follow-up 01/17/2013: She does get relief with Ansaid and is taking it 1-2 twice per week. She continues to have migraines daily, with pain over the entire headache (L >R) with migrainous features.  Started on TPM.  Follow-up 04/08/2012:  She has been on topamax 50mg  BID and has been tolerating it well.  She says that her daily headaches are resolved..  She has occasional tingling of her feet. She had a severe headache in early December and went to the emergency department where she received headache cocktail, but did not have significant benefit.  She attributes this to being tapered off Chantix.   Follow-up 7/10/2/015:  She is doing stable, still with tension headaches but has not treating them with any medications.  She is getting migraines about once per week and responds well to Ansaid. Imitrex 100 mg did not seem to have any effect on her migraines. She has tried Flexeril but paradoxically developed next discussed with it. She unfortunately picked up smoking again which she quit in 03/04/2013.  She is doing great with minimizing rescue medications and reports using them only once throughout the month.  - UPDATE 04/28/2014:   She continues to have headaches about three times per week, lasting all  day.  For severe headaches, Ansaid seems to help.  She is having paresthesias to the topamax and is not interested on increasing it.    - UPDATE 07/31/2014:  She is taking nortriptyline 20mg  qhs and unfortunately has not noticed any improvement in her headaches.  She continues to have headaches about 4 times per week, lasting all day.    - UPDATE 10/25/2014:  She feels that  her headaches are better and her paresthesias have resolved after switching to Trokendi.   She quit smoking on Memorial Day!     Medications:  Current Outpatient Prescriptions on File Prior to Visit  Medication Sig Dispense Refill  . flurbiprofen (ANSAID) 50 MG tablet Take 1 tablet twice daily as needed, no more than twice per week. 20 tablet 5  . nortriptyline (PAMELOR) 10 MG capsule Take one tablet at bedtime x 2 weeks, then increase to 2 tablets at bedtime. 60 capsule 3  . sertraline (ZOLOFT) 100 MG tablet Take 200 mg by mouth daily.    . Topiramate ER 100 MG CP24 Take 100 mg by mouth daily. 30 capsule 5  . zolpidem (AMBIEN) 10 MG tablet Take 10 mg by mouth at bedtime as needed for sleep.     No current facility-administered medications on file prior to visit.    Allergies: No Known Allergies   Review of Systems:  CONSTITUTIONAL: No fevers, chills, night sweats, or weight loss.   EYES: No visual changes or eye pain ENT: No hearing changes.  No history of nose bleeds.   RESPIRATORY: No cough, wheezing and shortness of breath.   CARDIOVASCULAR: Negative for chest pain, and palpitations.   GI: Negative for abdominal discomfort, blood in stools or black stools.  No recent change in bowel habits.   GU:  No history of incontinence.   MUSCLOSKELETAL: No history of joint pain or swelling.  No myalgias.   SKIN: Negative for lesions, rash, and itching.   HEMATOLOGY/ONCOLOGY: Negative for prolonged bleeding, bruising easily, and swollen nodes.   ENDOCRINE: Negative for cold or heat intolerance, polydipsia or goiter.   PSYCH:  +depression or anxiety symptoms.   NEURO: As Above.   Vital Signs:  BP 108/80 mmHg  Pulse 96  Ht 5\' 7"  (1.702 m)  Wt 222 lb 6 oz (100.869 kg)  BMI 34.82 kg/m2  SpO2 96%  Neurological Exam: MENTAL STATUS:  Awake, alert, oriented x 3. Speech is not dysarthric.  CRANIAL NERVES:   Pupils equal round and reactive to light.  Normal conjugate, extra-ocular eye  movements in all directions of gaze.  No nystagmus.  No ptosis.  Normal facial symmetry and movements.  Palate elevates symmetrically.    MOTOR:  Motor strength is 5/5 in all four extremities.   COORDINATION/GAIT: Gait narrow based and stable.   Data: MRI brain 12/20/2012: Subcentimeter left posterior frontal extra-axial mass lesion, likely incidental meningioma. Otherwise negative exam. MRA circle of willis and carotids 12/20/2012:  Patent vasculature   IMPRESSION: 1. 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 Episodic migraine, about twice per month 3. Depression 4. Prior tobacco use, praised for stopping since May 2016   PLAN/RECOMMENDATIONS:  1.  Continue Trokendi 100mg  XR daily 2.  Reduce nortriptyline 10mg  at bedtime x 2 week, then stop 3.  Start relpax 20mg  for severe migraine  4.  Return to clinic in 40-months, or sooner as needed  The duration of this appointment visit was 20 minutes of face-to-face time with the patient.  Greater than 50% of this time was spent in counseling, explanation of diagnosis, planning of further management, and coordination of care.   Thank you for allowing me to participate in patient's care.  If I can answer any additional questions, I would be pleased to do so.    Sincerely,    Donika K. Posey Pronto, DO

## 2014-10-25 NOTE — Patient Instructions (Addendum)
1.  Excellent work with quitting smoking! 2.  Continue Trokendi 100mg  daily 3.  Reduce nortriptyline to 10mg  for 2 weeks and then stop 4.  For severe migraines, start relpax 20mg  at headache onset.  Repeat in 2 hours if no improvement 5.  Encouraged to stay actrive 6.  Return to clinic in 6 months

## 2014-10-31 ENCOUNTER — Ambulatory Visit: Payer: 59 | Admitting: Neurology

## 2014-11-02 ENCOUNTER — Ambulatory Visit: Payer: 59 | Admitting: Neurology

## 2014-11-24 ENCOUNTER — Telehealth: Payer: Self-pay | Admitting: Family Medicine

## 2014-11-24 NOTE — Telephone Encounter (Signed)
Pt would like to be worked in this year for a cpe, Pt is scheduled first avail 04/13/15.

## 2014-11-28 NOTE — Telephone Encounter (Signed)
Okay to work in per Dr Todd 

## 2014-11-29 NOTE — Telephone Encounter (Signed)
Pt has been sch

## 2015-01-22 ENCOUNTER — Encounter: Payer: Self-pay | Admitting: Neurology

## 2015-01-24 ENCOUNTER — Other Ambulatory Visit (INDEPENDENT_AMBULATORY_CARE_PROVIDER_SITE_OTHER): Payer: 59

## 2015-01-24 DIAGNOSIS — Z0000 Encounter for general adult medical examination without abnormal findings: Secondary | ICD-10-CM

## 2015-01-24 LAB — LIPID PANEL
CHOLESTEROL: 169 mg/dL (ref 0–200)
HDL: 44.7 mg/dL (ref >39.00)
LDL CALC: 98 mg/dL (ref 0–99)
NonHDL: 123.8
TRIGLYCERIDES: 131 mg/dL (ref 0.0–149.0)
Total CHOL/HDL Ratio: 4
VLDL: 26.2 mg/dL (ref 0.0–40.0)

## 2015-01-24 LAB — CBC WITH DIFFERENTIAL/PLATELET
BASOS PCT: 0.6 % (ref 0.0–3.0)
Basophils Absolute: 0 10*3/uL (ref 0.0–0.1)
Eosinophils Absolute: 0.1 10*3/uL (ref 0.0–0.7)
Eosinophils Relative: 1.4 % (ref 0.0–5.0)
HEMATOCRIT: 43.2 % (ref 36.0–46.0)
Hemoglobin: 14.3 g/dL (ref 12.0–15.0)
Lymphocytes Relative: 31.4 % (ref 12.0–46.0)
Lymphs Abs: 2.2 10*3/uL (ref 0.7–4.0)
MCHC: 33.1 g/dL (ref 30.0–36.0)
MCV: 92.2 fl (ref 78.0–100.0)
Monocytes Absolute: 0.4 10*3/uL (ref 0.1–1.0)
Monocytes Relative: 5.6 % (ref 3.0–12.0)
NEUTROS PCT: 61 % (ref 43.0–77.0)
Neutro Abs: 4.3 10*3/uL (ref 1.4–7.7)
PLATELETS: 219 10*3/uL (ref 150.0–400.0)
RBC: 4.68 Mil/uL (ref 3.87–5.11)
RDW: 13.3 % (ref 11.5–15.5)
WBC: 7.1 10*3/uL (ref 4.0–10.5)

## 2015-01-24 LAB — BASIC METABOLIC PANEL
BUN: 13 mg/dL (ref 6–23)
CHLORIDE: 107 meq/L (ref 96–112)
CO2: 26 mEq/L (ref 19–32)
CREATININE: 0.7 mg/dL (ref 0.40–1.20)
Calcium: 9.3 mg/dL (ref 8.4–10.5)
GFR: 100.52 mL/min (ref >60.00)
Glucose, Bld: 97 mg/dL (ref 70–99)
POTASSIUM: 4.3 meq/L (ref 3.5–5.1)
Sodium: 139 mEq/L (ref 135–145)

## 2015-01-24 LAB — HEPATIC FUNCTION PANEL
ALT: 14 U/L (ref 0–35)
AST: 14 U/L (ref 0–37)
Albumin: 3.8 g/dL (ref 3.5–5.2)
Alkaline Phosphatase: 55 U/L (ref 39–117)
Bilirubin, Direct: 0 mg/dL (ref 0.0–0.3)
Total Bilirubin: 0.4 mg/dL (ref 0.2–1.2)
Total Protein: 6.4 g/dL (ref 6.0–8.3)

## 2015-01-24 LAB — POCT URINALYSIS DIPSTICK
Bilirubin, UA: NEGATIVE
Blood, UA: NEGATIVE
Glucose, UA: NEGATIVE
Ketones, UA: NEGATIVE
Leukocytes, UA: NEGATIVE
NITRITE UA: NEGATIVE
PH UA: 7.5
PROTEIN UA: NEGATIVE
Spec Grav, UA: 1.015
UROBILINOGEN UA: 0.2

## 2015-01-24 LAB — TSH: TSH: 2.16 u[IU]/mL (ref 0.35–4.50)

## 2015-01-24 MED ORDER — TOPIRAMATE 100 MG PO TABS: 100.0000 mg | ORAL_TABLET | Freq: Every day | ORAL | Status: DC

## 2015-01-29 ENCOUNTER — Other Ambulatory Visit: Payer: 59

## 2015-01-29 ENCOUNTER — Encounter: Payer: Self-pay | Admitting: Family Medicine

## 2015-01-29 ENCOUNTER — Ambulatory Visit (INDEPENDENT_AMBULATORY_CARE_PROVIDER_SITE_OTHER): Payer: 59 | Admitting: Family Medicine

## 2015-01-29 VITALS — BP 110/80 | Temp 99.1°F | Ht 68.0 in | Wt 214.0 lb

## 2015-01-29 DIAGNOSIS — Z0000 Encounter for general adult medical examination without abnormal findings: Secondary | ICD-10-CM | POA: Insufficient documentation

## 2015-01-29 DIAGNOSIS — R51 Headache: Secondary | ICD-10-CM | POA: Diagnosis not present

## 2015-01-29 DIAGNOSIS — G479 Sleep disorder, unspecified: Secondary | ICD-10-CM | POA: Diagnosis not present

## 2015-01-29 DIAGNOSIS — Z23 Encounter for immunization: Secondary | ICD-10-CM

## 2015-01-29 DIAGNOSIS — R519 Headache, unspecified: Secondary | ICD-10-CM

## 2015-01-29 MED ORDER — TOPIRAMATE 100 MG PO TABS: 100.0000 mg | ORAL_TABLET | Freq: Every day | ORAL | Status: DC

## 2015-01-29 NOTE — Patient Instructions (Addendum)
Continue current medications  Follow-up in one year sooner if any problems  [place] or Almyra Free............... are 2 new adult nurse practitioner's. When you call in July for your checkup in October make an appointment to see Tommi Rumps or Almyra Free for your physical and to establish long-term care  Congratulations on no smoking keep up the good work  Continue daily exercise  Dr. Gloriann Loan.... DDS.......... call and set up a dental consult  See a gynecologist this fall for a Pap smear and discuss birth control........... until you get anything definitive I would recommend condoms

## 2015-01-29 NOTE — Progress Notes (Signed)
   Subjective:    Patient ID: Angelica Diaz, female    DOB: 1980-05-31, 34 y.o.   MRN: [id number]  HPI Nehal is a 34 year old single female ex-smoker........Marland Kitchen since Memorial Day 2016....... who comes in today for general physical examination  She quit smoking with the tapering program. We have given her Larene [place] takes but it caused CNS side effects that she didn't take it.  She's currently seeing a psychiatrist and is been on lithium 450 mg daily for about 2 months and feels much better. Her psychiatrist also has her on Zoloft 100 mg at bedtime.  We have given her Topamax in the past 100 mg daily prevent migraines. In the past year she's had very few migraines and has not had to take any of the Relpax at all.  She gets routine eye care, dental care, and you mammography starting at age 33. Colonoscopy at age 19 no family history of colon cancer polyps  LMP 2 weeks ago normal she's not using anything for birth control but would like to discuss that. She sees a GYN. Advised to see her GYN to talk about all the issues.  Vaccination history do a tetanus booster which she agrees to take. She declines a flu vaccine.  She also needs a dental checkup. She has not had one in many years.   Review of Systems  Constitutional: Negative.   HENT: Negative.   Eyes: Negative.   Respiratory: Negative.   Cardiovascular: Negative.   Gastrointestinal: Negative.   Endocrine: Negative.   Genitourinary: Negative.   Musculoskeletal: Negative.   Skin: Negative.   Allergic/Immunologic: Negative.   Neurological: Negative.   Hematological: Negative.   Psychiatric/Behavioral: Negative.        Objective:   Physical Exam  Constitutional: She is oriented to person, place, and time. She appears well-developed and well-nourished.  HENT:  Head: Normocephalic and atraumatic.  Right Ear: External ear normal.  Left Ear: External ear normal.  Nose: Nose normal.  Mouth/Throat: Oropharynx is clear and moist.    Eyes: EOM are normal. Pupils are equal, round, and reactive to light.  Neck: Normal range of motion. Neck supple. No JVD present. No tracheal deviation present. No thyromegaly present.  Cardiovascular: Normal rate, regular rhythm, normal heart sounds and intact distal pulses.  Exam reveals no gallop and no friction rub.   No murmur heard. Pulmonary/Chest: Effort normal and breath sounds normal. No stridor. No respiratory distress. She has no wheezes. She has no rales. She exhibits no tenderness.  Abdominal: Soft. Bowel sounds are normal. She exhibits no distension and no mass. There is no tenderness. There is no rebound and no guarding.  Genitourinary:  Bilateral breast exam normal  Musculoskeletal: Normal range of motion.  Lymphadenopathy:    She has no cervical adenopathy.  Neurological: She is alert and oriented to person, place, and time. She has normal reflexes. No cranial nerve deficit. She exhibits normal muscle tone. Coordination normal.  Skin: Skin is warm and dry. No rash noted. No erythema. No pallor.  Total body skin exam normal  Psychiatric: She has a normal mood and affect. Her behavior is normal. Judgment and thought content normal.  Nursing note and vitals reviewed.         Assessment & Plan:  Healthy female  Migraine headaches under good control with Topamax 100 mg daily  History of depression.......... continue follow-up by psychiatrist......... currently on Zoloft and lithium  Concern about birth control........... consult with GYN

## 2015-01-29 NOTE — Addendum Note (Signed)
Addended by: Westley Hummer B on: 01/29/2015 04:30 PM   Modules accepted: Orders

## 2015-01-29 NOTE — Progress Notes (Signed)
Pre visit review using our clinic review tool, if applicable. No additional management support is needed unless otherwise documented below in the visit note. 

## 2015-02-01 ENCOUNTER — Encounter: Payer: Self-pay | Admitting: Family Medicine

## 2015-02-05 ENCOUNTER — Encounter: Payer: 59 | Admitting: Family Medicine

## 2015-02-12 ENCOUNTER — Encounter: Payer: Self-pay | Admitting: Family Medicine

## 2015-02-14 MED ORDER — NORETHINDRONE-MESTRANOL 1-50 MG-MCG PO TABS: 1.0000 | ORAL_TABLET | Freq: Every day | ORAL | Status: DC

## 2015-02-14 NOTE — Addendum Note (Signed)
Addended by: Westley Hummer B on: 02/14/2015 11:38 AM   Modules accepted: Orders

## 2015-03-01 ENCOUNTER — Emergency Department (HOSPITAL_BASED_OUTPATIENT_CLINIC_OR_DEPARTMENT_OTHER)
Admission: EM | Admit: 2015-03-01 | Discharge: 2015-03-01 | Disposition: A | Discharge status: Home / Self Care | Payer: 59 | Attending: Emergency Medicine | Admitting: Emergency Medicine

## 2015-03-01 ENCOUNTER — Emergency Department (HOSPITAL_BASED_OUTPATIENT_CLINIC_OR_DEPARTMENT_OTHER): Payer: 59

## 2015-03-01 DIAGNOSIS — F329 Major depressive disorder, single episode, unspecified: Secondary | ICD-10-CM | POA: Diagnosis not present

## 2015-03-01 DIAGNOSIS — Z87891 Personal history of nicotine dependence: Secondary | ICD-10-CM | POA: Insufficient documentation

## 2015-03-01 DIAGNOSIS — Z79899 Other long term (current) drug therapy: Secondary | ICD-10-CM | POA: Insufficient documentation

## 2015-03-01 DIAGNOSIS — Z8739 Personal history of other diseases of the musculoskeletal system and connective tissue: Secondary | ICD-10-CM | POA: Diagnosis not present

## 2015-03-01 DIAGNOSIS — F419 Anxiety disorder, unspecified: Secondary | ICD-10-CM | POA: Diagnosis not present

## 2015-03-01 DIAGNOSIS — M25532 Pain in left wrist: Secondary | ICD-10-CM | POA: Insufficient documentation

## 2015-03-01 DIAGNOSIS — J45909 Unspecified asthma, uncomplicated: Secondary | ICD-10-CM | POA: Insufficient documentation

## 2015-03-01 DIAGNOSIS — G43909 Migraine, unspecified, not intractable, without status migrainosus: Secondary | ICD-10-CM | POA: Insufficient documentation

## 2015-03-01 MED ORDER — PREDNISONE 20 MG PO TABS: 40.0000 mg | ORAL_TABLET | Freq: Every day | ORAL | Status: DC

## 2015-03-01 MED ORDER — NAPROXEN 500 MG PO TABS: 500.0000 mg | ORAL_TABLET | Freq: Two times a day (BID) | ORAL | Status: DC

## 2015-03-01 NOTE — ED Provider Notes (Addendum)
CSN: [id number]     Arrival date & time 03/01/15  [id number] History   First MD Initiated Contact with Patient 03/01/15 0700     Chief Complaint  Patient presents with  . Wrist Pain     (Consider location/radiation/quality/duration/timing/severity/associated sxs/prior Treatment) Patient is a 34 y.o. female presenting with wrist pain. The history is provided by the patient.  Wrist Pain This is a new problem. Episode onset: 4 days. The problem occurs constantly. The problem has been gradually worsening. Associated symptoms comments: Pain and swelling of the left wrist.  No prior hx of same.  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  She denies any injury.  No redness or fever.. The symptoms are aggravated by bending and twisting. Nothing relieves the symptoms. She has tried nothing (she has been wearing a bandage but no NSAID or tylenol use) for the symptoms. The treatment provided no relief.    Past Medical History  Diagnosis Date  . Depression   . Headache(784.0)   . Anxiety   . Sleep disorder   . Migraine   . Arthritis   . Asthma     hx of in teens   . PONV (postoperative nausea and vomiting)     and irritability after oral surgeries    Past Surgical History  Procedure Laterality Date  . Wisdom tooth extraction    . Cesarean section    . Oral surgeries     . Cholecystectomy N/A 05/27/2013    Procedure: LAPAROSCOPIC CHOLECYSTECTOMY WITH INTRAOPERATIVE CHOLANGIOGRAM;  Surgeon: Earnstine Regal, MD;  Location: [facility];  Service: General;  Laterality: N/A;   Family History  Problem Relation Age of Onset  . Diabetes Maternal Grandfather   . Migraines Mother   . Migraines Son   . Colon cancer Neg Hx   . Throat cancer Neg Hx   . Pancreatic cancer Neg Hx   . Stomach cancer Neg Hx   . Heart disease Neg Hx   . Kidney disease Neg Hx   . Liver disease Neg Hx    Social History  Substance Use Topics  . Smoking status: Former Smoker -- 0.50 packs/day for 18 years    Types: Cigarettes  .  Smokeless tobacco: Never Used     Comment: form given 08-15-13  . Alcohol Use: No     Comment: Occasionally 2-3 times per year    OB History    Gravida Para Term Preterm AB TAB SAB Ectopic Multiple Living   2 2             Review of Systems  All other systems reviewed and are negative.     Allergies  Review of patient's allergies indicates no known allergies.  Home Medications   Prior to Admission medications   Medication Sig Start Date End Date Taking? Authorizing Provider  flurbiprofen (ANSAID) 50 MG tablet Take 1 tablet twice daily as needed, no more than twice per week. 07/31/14   Donika Keith Rake, DO  lithium carbonate (ESKALITH) 450 MG CR tablet Take 450 mg by mouth daily.    Historical Provider, MD  Norethindrone-Mestranol (NECON) 1-50 MG-MCG tablet Take 1 tablet by mouth daily. 02/14/15   Dorena Cookey, MD  RELPAX 20 MG tablet  10/26/14   Historical Provider, MD  sertraline (ZOLOFT) 100 MG tablet Take 200 mg by mouth daily.    Historical Provider, MD  topiramate (TOPAMAX) 100 MG tablet Take 1 tablet (100 mg total) by mouth daily. 01/29/15  Dorena Cookey, MD  zolpidem (AMBIEN) 10 MG tablet Take 10 mg by mouth at bedtime as needed for sleep.    Historical Provider, MD   BP 107/67 mmHg  Pulse 70  Temp(Src) 98.6 F (37 C) (Oral)  Resp 18  SpO2 98% Physical Exam  Constitutional: She is oriented to person, place, and time. She appears well-developed and well-nourished. No distress.  HENT:  Head: Normocephalic and atraumatic.  Eyes: EOM are normal. Pupils are equal, round, and reactive to light.  Cardiovascular: Normal rate.   Pulmonary/Chest: Effort normal.  Musculoskeletal:       Left wrist: She exhibits tenderness, bony tenderness and swelling.       Arms: 2+ left wrist radial pulse and no palmar tenderness of the wrist.  <2 sec cap refill  Neurological: She is alert and oriented to person, place, and time.  Skin: Skin is warm and dry.  Psychiatric: She has a normal  mood and affect. Her behavior is normal.  Nursing note and vitals reviewed.   ED Course  Procedures (including critical care time) Labs Review Labs Reviewed - No data to display  Imaging Review Dg Wrist Complete Left  03/01/2015  CLINICAL DATA:  Pain for 3 days EXAM: LEFT WRIST - COMPLETE 3+ VIEW COMPARISON:  None. FINDINGS: Frontal, oblique, lateral, and ulnar deviation scaphoid images were obtained. There is no fracture or dislocation. Joint spaces appear intact. No erosive change. Incidental note is made of a minus ulnar variant. IMPRESSION: No fracture or dislocation.  No appreciable arthropathy. Electronically Signed   By: Lowella Grip III M.D.   On: 03/01/2015 07:51   I have personally reviewed and evaluated these images and lab results as part of my medical decision-making.   EKG Interpretation None      MDM   Final diagnoses:  Wrist pain, acute, left    Patient is a 34 year old female who presents today with 4 days of worsening left wrist pain. 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 She does not recall any injury. No other concerns or complaints today. Patient has not taken any medication for the pain. Patient has significant tenderness over the dorsal portion of the wrist with significant tenderness with flexion and extension and thumb flexion. Normal sensation and capillary refill less than 3 seconds.  Imaging is negative. Feel most likely joint inflammation from overuse. We'll start patient on anti-inflammatories and prednisone she was given a wrist cock-up splint to wear for comfort. Patient given follow-up with hand surgery if symptoms do not improve.    Blanchie Dessert, MD 03/01/15 [id number]  Blanchie Dessert, MD 03/01/15 [phone]

## 2015-03-01 NOTE — ED Notes (Signed)
Onset pain Monday past that is worse today

## 2015-03-01 NOTE — ED Notes (Signed)
Patient transported to X-ray 

## 2015-04-06 ENCOUNTER — Other Ambulatory Visit: Payer: 59

## 2015-04-10 ENCOUNTER — Encounter: Payer: 59 | Admitting: Family Medicine

## 2015-04-24 ENCOUNTER — Encounter: Payer: Self-pay | Admitting: Family Medicine

## 2015-04-27 ENCOUNTER — Ambulatory Visit: Payer: 59 | Admitting: Neurology

## 2015-05-14 LAB — LIPID PANEL
CHOLESTEROL: 268 mg/dL — AB (ref 0–200)
HDL: 81 mg/dL — AB (ref 35–70)
LDL CALC: 102 mg/dL
Triglycerides: 196 mg/dL — AB (ref 40–160)

## 2015-05-14 LAB — HEPATIC FUNCTION PANEL
ALT: 39 U/L — AB (ref 7–35)
AST: 35 U/L (ref 13–35)
Alkaline Phosphatase: 67 U/L (ref 25–125)
Bilirubin, Total: 0.3 mg/dL

## 2015-05-14 LAB — BASIC METABOLIC PANEL
BUN: 19 mg/dL (ref 4–21)
Creatinine: 0.8 mg/dL (ref 0.5–1.1)
GLUCOSE: 92 mg/dL
Potassium: 4.3 mmol/L (ref 3.4–5.3)
SODIUM: 139 mmol/L (ref 137–147)

## 2015-05-15 ENCOUNTER — Telehealth: Payer: Self-pay | Admitting: Family Medicine

## 2015-05-15 NOTE — Telephone Encounter (Signed)
Fine with me

## 2015-05-15 NOTE — Telephone Encounter (Signed)
Patient would like to transfer from Dr. Sherren Mocha to [place] due to the [facility] location being convenient, please advise

## 2015-05-16 NOTE — Telephone Encounter (Signed)
Okay with me 

## 2015-05-17 NOTE — Telephone Encounter (Signed)
Patient scheduled for 05/18/2015 at 8:30am

## 2015-05-18 ENCOUNTER — Encounter: Payer: Self-pay | Admitting: Physician Assistant

## 2015-05-18 ENCOUNTER — Ambulatory Visit (INDEPENDENT_AMBULATORY_CARE_PROVIDER_SITE_OTHER): Payer: BLUE CROSS/BLUE SHIELD | Admitting: Physician Assistant

## 2015-05-18 VITALS — BP 110/72 | HR 80 | Temp 98.0°F | Ht 68.0 in | Wt 230.0 lb

## 2015-05-18 DIAGNOSIS — F319 Bipolar disorder, unspecified: Secondary | ICD-10-CM

## 2015-05-18 DIAGNOSIS — E669 Obesity, unspecified: Secondary | ICD-10-CM | POA: Diagnosis not present

## 2015-05-18 HISTORY — DX: Bipolar disorder, unspecified: F31.9

## 2015-05-18 MED ORDER — PHENTERMINE HCL 30 MG PO CAPS: 30.0000 mg | ORAL_CAPSULE | ORAL | Status: DC

## 2015-05-18 NOTE — Progress Notes (Signed)
Patient presents to clinic today to transfer care from Dr. Sherren Mocha. Patient with history of bipolar disorder, followed by Psychiatry. Was recently placed on Lithium, Prozac and Zyprexa. Endorses doing very well. Has follow-up with Dr. Toy Care in a couple of weeks. Patient would like to discuss medication options to help with weight loss. Has been a member of a local gym since August of 2016. Endorses gym sessions 4-5 times per week. Her regimen consists of 20-30 minutes of treadmill, 20-30 minutes of the elliptical and then 10-15 minutes on the stair climbers. Patient endorses her biggest issue is her diet. Is currently eating only 2 meals per day, one of which is typically fast food. Denies any change in weight with exercise. TSH recently checked and within normal limits. Body mass index is 34.98 kg/(m^2).  Past Medical History  Diagnosis Date  . Depression   . Headache(784.0)   . Anxiety   . Sleep disorder   . Migraine   . Arthritis   . Asthma     hx of in teens   . PONV (postoperative nausea and vomiting)     and irritability after oral surgeries     Current Outpatient Prescriptions on File Prior to Visit  Medication Sig Dispense Refill  . flurbiprofen (ANSAID) 50 MG tablet Take 1 tablet twice daily as needed, no more than twice per week. 20 tablet 5  . lithium carbonate (ESKALITH) 450 MG CR tablet Take 450 mg by mouth daily.    . Norethindrone-Mestranol (NECON) 1-50 MG-MCG tablet Take 1 tablet by mouth daily. 1 Package 11  . zolpidem (AMBIEN) 10 MG tablet Take 10 mg by mouth at bedtime as needed for sleep.     No current facility-administered medications on file prior to visit.    No Known Allergies  Family History  Problem Relation Age of Onset  . Diabetes Maternal Grandfather   . Migraines Mother   . Migraines Son   . Colon cancer Neg Hx   . Throat cancer Neg Hx   . Pancreatic cancer Neg Hx   . Stomach cancer Neg Hx   . Heart disease Neg Hx   . Kidney disease Neg Hx   .  Liver disease Neg Hx     Social History   Social History  . Marital Status: Single    Spouse Name: N/A  . Number of Children: 2  . Years of Education: N/A   Occupational History  . Insurance agent    Social History Main Topics  . Smoking status: Former Smoker -- 0.50 packs/day for 18 years    Types: Cigarettes  . Smokeless tobacco: Never Used     Comment: form given 08-15-13  . Alcohol Use: No     Comment: Occasionally 2-3 times per year   . Drug Use: No  . Sexual Activity: Not Asked   Other Topics Concern  . None   Social History Narrative   Currently sells insurance.   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  She has two healthy children.   Review of Systems - See HPI.  All other ROS are negative.  BP 110/72 mmHg  Pulse 80  Temp(Src) 98 F (36.7 C) (Oral)  Ht 5\' 8"  (1.727 m)  Wt 230 lb (104.327 kg)  BMI 34.98 kg/m2  SpO2 100%  LMP 05/11/2015  Physical Exam  Constitutional: She is oriented to person, place, and time and well-developed, well-nourished, and in no distress.  HENT:  Head: Normocephalic and atraumatic.  Eyes: Conjunctivae are normal.  Neck: Neck supple. No thyromegaly present.  Cardiovascular: Normal rate, regular rhythm, normal heart sounds and intact distal pulses.   Pulmonary/Chest: Effort normal and breath sounds normal. No respiratory distress. She has no wheezes. She has no rales. She exhibits no tenderness.  Neurological: She is alert and oriented to person, place, and time.  Skin: Skin is warm and dry. No rash noted.  Psychiatric: Affect normal.  Vitals reviewed.   No results found for this or any previous visit (from the past 2160 hour(s)).  Assessment/Plan: Bipolar I disorder ([place]) Followed by Dr. Toy Care. Doing well. Will have her follow-up as scheduled.  Obesity Body mass index is 34.98 kg/(m^2). Doing well in terms of exercise. Diet is an issue. Worked with patient on appropriate food choices, meal planning and timing of meals. Continue exercise  regimen. Will begin trial of phentermine to supplement changes the patient is trying to make. Follow-up 1 week.

## 2015-05-18 NOTE — Patient Instructions (Signed)
Please continue your exercise regimen at the gym. Always hydrate before and after a workout.  For diet try the following: (1) Use the meal-planning guide to help make your meals. (2) Eat a healthy meal three times daily. (3) Eat a healthy snack between each meal -- handful of almonds or an apple with peanut butter. Yogurt is a good option.  Please take the phentermine as directed. The system is showing your insurance may not cover any medication without a prior authorization. If the pharmacist tells you this, have them fax the PA forms to my office at [phone] and I will fill them out so we can get the insurance to pay for the medication.  Follow-up 1 month.

## 2015-05-18 NOTE — Assessment & Plan Note (Signed)
Followed by Dr. Toy Care. Doing well. Will have her follow-up as scheduled.

## 2015-05-18 NOTE — Progress Notes (Signed)
Pre visit review using our clinic review tool, if applicable. No additional management support is needed unless otherwise documented below in the visit note. 

## 2015-05-18 NOTE — Assessment & Plan Note (Signed)
Body mass index is 34.98 kg/(m^2). Doing well in terms of exercise. Diet is an issue. Worked with patient on appropriate food choices, meal planning and timing of meals. Continue exercise regimen. Will begin trial of phentermine to supplement changes the patient is trying to make. Follow-up 1 week.

## 2015-05-21 ENCOUNTER — Encounter: Payer: Self-pay | Admitting: Physician Assistant

## 2015-05-28 ENCOUNTER — Ambulatory Visit: Payer: Self-pay | Admitting: Family Medicine

## 2015-06-11 ENCOUNTER — Encounter: Payer: Self-pay | Admitting: Physician Assistant

## 2015-06-15 ENCOUNTER — Ambulatory Visit: Payer: BLUE CROSS/BLUE SHIELD | Admitting: Physician Assistant

## 2015-06-19 ENCOUNTER — Encounter: Payer: Self-pay | Admitting: Physician Assistant

## 2015-06-19 ENCOUNTER — Ambulatory Visit (INDEPENDENT_AMBULATORY_CARE_PROVIDER_SITE_OTHER): Payer: BLUE CROSS/BLUE SHIELD | Admitting: Physician Assistant

## 2015-06-19 VITALS — BP 100/68 | HR 88 | Temp 98.2°F | Ht 68.0 in | Wt 223.8 lb

## 2015-06-19 DIAGNOSIS — E669 Obesity, unspecified: Secondary | ICD-10-CM

## 2015-06-19 MED ORDER — PHENTERMINE HCL 37.5 MG PO CAPS: 37.5000 mg | ORAL_CAPSULE | ORAL | Status: DC

## 2015-06-19 NOTE — Patient Instructions (Signed)
I am glad you are doing better with diet and exercise. The medication is helping with weight loss. We will increase to a 37.5 mg capsule daily. Take as directed.  Follow-up in 2 months.

## 2015-06-19 NOTE — Assessment & Plan Note (Signed)
Doing well. Vitals stable. 8 pound weight loss within the past 5 weeks. Will increase to 37.5 mg phentermine as patient tolerating medicine well. Continue diet and exercise. Will follow-up in 2 months.

## 2015-06-19 NOTE — Progress Notes (Signed)
Pre visit review using our clinic review tool, if applicable. No additional management support is needed unless otherwise documented below in the visit note. 

## 2015-06-19 NOTE — Progress Notes (Signed)
Patient presents to clinic today for 1 month follow-up of obesity after starting the phentermine 30 mg daily. Endorses taking medication as directed. Has rejoined the gym and working out 5 days per week. Has noted an 8 pound weight loss since last visit. Denies side effects of medication.  Past Medical History  Diagnosis Date  . Depression   . Headache(784.0)   . Anxiety   . Sleep disorder   . Migraine   . Arthritis   . Asthma     hx of in teens   . PONV (postoperative nausea and vomiting)     and irritability after oral surgeries     Current Outpatient Prescriptions on File Prior to Visit  Medication Sig Dispense Refill  . FLUoxetine (PROZAC) 20 MG capsule Take 20 mg by mouth daily.  0  . flurbiprofen (ANSAID) 50 MG tablet Take 1 tablet twice daily as needed, no more than twice per week. 20 tablet 5  . lithium carbonate (ESKALITH) 450 MG CR tablet Take 450 mg by mouth daily.    . Norethindrone-Mestranol (NECON) 1-50 MG-MCG tablet Take 1 tablet by mouth daily. 1 Package 11  . OLANZapine (ZYPREXA) 5 MG tablet Take 1 tablet by mouth daily.  4  . zolpidem (AMBIEN) 10 MG tablet Take 10 mg by mouth at bedtime as needed for sleep.     No current facility-administered medications on file prior to visit.    No Known Allergies  Family History  Problem Relation Age of Onset  . Diabetes Maternal Grandfather   . Migraines Mother   . Migraines Son   . Colon cancer Neg Hx   . Throat cancer Neg Hx   . Pancreatic cancer Neg Hx   . Stomach cancer Neg Hx   . Heart disease Neg Hx   . Kidney disease Neg Hx   . Liver disease Neg Hx     Social History   Social History  . Marital Status: Single    Spouse Name: N/A  . Number of Children: 2  . Years of Education: N/A   Occupational History  . Insurance agent    Social History Main Topics  . Smoking status: Former Smoker -- 0.50 packs/day for 18 years    Types: Cigarettes  . Smokeless tobacco: Never Used     Comment: form given  08-15-13  . Alcohol Use: No     Comment: Occasionally 2-3 times per year   . Drug Use: No  . Sexual Activity: Not Asked   Other Topics Concern  . None   Social History Narrative   Currently sells insurance.   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  She has two healthy children.   Review of Systems - See HPI.  All other ROS are negative.  BP 100/68 mmHg  Pulse 88  Temp(Src) 98.2 F (36.8 C) (Oral)  Ht 5\' 8"  (1.727 m)  Wt 223 lb 12.8 oz (101.515 kg)  BMI 34.04 kg/m2  SpO2 99%  LMP 06/05/2015  Physical Exam  Constitutional: She is oriented to person, place, and time and well-developed, well-nourished, and in no distress.  HENT:  Head: Normocephalic and atraumatic.  Eyes: Conjunctivae are normal.  Cardiovascular: Normal rate, regular rhythm, normal heart sounds and intact distal pulses.   Pulmonary/Chest: Effort normal and breath sounds normal. No respiratory distress. She has no wheezes. She has no rales. She exhibits no tenderness.  Neurological: She is alert and oriented to person, place, and time.  Psychiatric: Affect normal.  Vitals reviewed.  Recent Results (from the past 2160 hour(s))  Basic metabolic panel     Status: None   Collection Time: 05/14/15 12:00 AM  Result Value Ref Range   Glucose 92 mg/dL   BUN 19 4 - 21 mg/dL   Creatinine 0.8 0.5 - 1.1 mg/dL   Potassium 4.3 3.4 - 5.3 mmol/L   Sodium 139 137 - 147 mmol/L  Lipid panel     Status: Abnormal   Collection Time: 05/14/15 12:00 AM  Result Value Ref Range   Triglycerides 196 (A) 40 - 160 mg/dL   Cholesterol 268 (A) 0 - 200 mg/dL   HDL 81 (A) 35 - 70 mg/dL   LDL Cholesterol 102 mg/dL  Hepatic function panel     Status: Abnormal   Collection Time: 05/14/15 12:00 AM  Result Value Ref Range   Alkaline Phosphatase 67 25 - 125 U/L   ALT 39 (A) 7 - 35 U/L   AST 35 13 - 35 U/L   Bilirubin, Total 0.3 mg/dL    Assessment/Plan: Obesity Doing well. Vitals stable. 8 pound weight loss within the past 5 weeks. Will increase  to 37.5 mg phentermine as patient tolerating medicine well. Continue diet and exercise. Will follow-up in 2 months.

## 2015-07-24 ENCOUNTER — Encounter: Payer: Self-pay | Admitting: Neurology

## 2015-07-31 ENCOUNTER — Encounter: Payer: Self-pay | Admitting: *Deleted

## 2015-07-31 ENCOUNTER — Ambulatory Visit (INDEPENDENT_AMBULATORY_CARE_PROVIDER_SITE_OTHER): Payer: BLUE CROSS/BLUE SHIELD | Admitting: Neurology

## 2015-07-31 ENCOUNTER — Encounter: Payer: Self-pay | Admitting: Neurology

## 2015-07-31 VITALS — BP 110/80 | HR 86 | Ht 68.0 in | Wt 219.1 lb

## 2015-07-31 DIAGNOSIS — R519 Headache, unspecified: Secondary | ICD-10-CM

## 2015-07-31 DIAGNOSIS — R51 Headache: Secondary | ICD-10-CM

## 2015-07-31 DIAGNOSIS — G43109 Migraine with aura, not intractable, without status migrainosus: Secondary | ICD-10-CM | POA: Diagnosis not present

## 2015-07-31 MED ORDER — TOPIRAMATE ER 100 MG PO SPRINKLE CAP24: 100.0000 mg | EXTENDED_RELEASE_CAPSULE | Freq: Every day | ORAL | Status: DC

## 2015-07-31 MED ORDER — FLURBIPROFEN 50 MG PO TABS: ORAL_TABLET | ORAL | Status: DC

## 2015-07-31 MED ORDER — TOPIRAMATE ER 100 MG PO SPRINKLE CAP24: 1.0000 | EXTENDED_RELEASE_CAPSULE | Freq: Every day | ORAL | Status: DC

## 2015-07-31 NOTE — Patient Instructions (Addendum)
Start Qudexy 100mg  daily Continue Ansaid 50mg  as needed for severe headaches MRI/A brain without contrast  Return to clinic in 3 months

## 2015-07-31 NOTE — Progress Notes (Signed)
[facility]  Follow-up Visit   Date: 07/31/2015    BAHATI ZETTEL MRN: [id number] DOB: [birth year]   Interim History:e Angelica Diaz is a 35 y.o. year-old right-handed Caucasian female with history of anxiety and bipolar depression returning to the clinic for follow-up of migraines and chronic daily headaches.  The patient was accompanied to the clinic by self.     History of present illness: Intial visit 12/10/2012:  Patient started having migraines around the age of 47. As a teenager to the age of 56, they would occur once per week, but this has improved since then. Pain is holocephalic and throbbing, lasting 1-3 days and occuring once every 4-6 weeks. She endorses photophobia, phonophobia, nausea, vomiting, and lack of appetite. No flashing lights, numbness/tingling, or weakness. Stress exacerbates symptoms. Usually, she takes ibuprofen and sleeps, which helps. She takes ibuprofen 4-8 pills about 2-3x per week. She has previously tried hydrocodone, reglan, and was given a prescription for zomig which she never filled.   On September 3, she was was celebrating her daughter's birthday and her entire face went completely numb. She had associated slurring of her speech and she broke out in a cold sweat. This lasted 20 minutes. She sat down, otherwise she would have passed out. About an hour later, she had the worst migraine she has ever encountered. She placed an ice pack on her head, took ibuprofen, and tried to lay down. Pain slowly resolved by the following afternoon. On September 10, she went to the emergency department because she still was not feeling back to her baseline and continued to have a dull holocephalic headache and new word-finding problems. She remembers talking and not being able to get the words out. She had CT brain, but we do not have the results to review. She received a headache cocktail, without significant relief.  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   No relief with propranolol so started on topiramate and did very well on 50mg  BID, but did start experiencing paresthesias.  She uses Ansaid for severe headaches which she gets about once per week.  There was no benefit with sumatriptan.   - UPDATE 07/31/2014:  She is taking nortriptyline 20mg  qhs and unfortunately has not noticed any improvement in her headaches.  She continues to have headaches about 4 times per week, lasting all day.    - UPDATE 10/25/2014:  She feels that her headaches are better and her paresthesias have resolved after switching to Trokendi.   She quit smoking on Memorial Day!    - UPDATE 07/31/2015:  She reports stopping Trokendi last year because the tablets were too big for her to swallow.  She is currently not taking anything for preventative headaches.  She takes Ansaid a few times per month.  She now had a dull headaches daily and migraines about once per week.  Last week, she developed severe pressure-like sensation as if her brains were going to explode through her skull.  She is very tearful talking about the severity of the pain, which lasted two days.  She did not go to the emergency department because she has no one to watch her son.  She did not have any weakness, numbness, tingling, vision or speech changes.  She is very concerned because this was the worst headache she has ever had and it woke her up from sleeping.  She has started smoking several months ago.   Medications:  Current Outpatient Prescriptions on File Prior to Visit  Medication Sig Dispense Refill  . ALPRAZolam (XANAX) 1 MG tablet take 1 tablet by mouth twice a day if needed  0  . FLUoxetine (PROZAC) 20 MG capsule Take 20 mg by mouth daily.  0  . lithium carbonate (ESKALITH) 450 MG CR tablet Take 450 mg by mouth daily.    . Norethindrone-Mestranol (NECON) 1-50 MG-MCG tablet Take 1 tablet by  mouth daily. 1 Package 11  . OLANZapine (ZYPREXA) 5 MG tablet Take 1 tablet by mouth daily.  4  . phentermine 37.5 MG capsule Take 1 capsule (37.5 mg total) by mouth every morning. 30 capsule 1  . zolpidem (AMBIEN) 10 MG tablet Take 10 mg by mouth at bedtime as needed for sleep.     No current facility-administered medications on file prior to visit.    Allergies: No Known Allergies   Review of Systems:  CONSTITUTIONAL: No fevers, chills, night sweats, or weight loss.   EYES: No visual changes or eye pain ENT: No hearing changes.  No history of nose bleeds.   RESPIRATORY: No cough, wheezing and shortness of breath.   CARDIOVASCULAR: Negative for chest pain, and palpitations.   GI: Negative for abdominal discomfort, blood in stools or black stools.  No recent change in bowel habits.   GU:  No history of incontinence.   MUSCLOSKELETAL: No history of joint pain or swelling.  No myalgias.   SKIN: Negative for lesions, rash, and itching.   HEMATOLOGY/ONCOLOGY: Negative for prolonged bleeding, bruising easily, and swollen nodes.   ENDOCRINE: Negative for cold or heat intolerance, polydipsia or goiter.   PSYCH:  +depression or anxiety symptoms.   NEURO: As Above.   Vital Signs:  BP 110/80 mmHg  Pulse 86  Ht 5\' 8"  (1.727 m)  Wt 219 lb 1 oz (99.366 kg)  BMI 33.32 kg/m2  SpO2 98%  Neurological Exam: MENTAL STATUS:  Awake, alert, oriented x 3. Speech is not dysarthric.  She is tearful and very depressed appearing today.   CRANIAL NERVES:   Pupils equal round and reactive to light.  Normal conjugate, extra-ocular eye movements in all directions of gaze.  No nystagmus.  No ptosis.  Normal facial symmetry and movements.  Palate elevates symmetrically.    MOTOR:  Motor strength is 5/5 in all four extremities.    SENSORY:  Vibration intact throughout  REFLEXES:  Normal 2+/4 reflexes throughout  COORDINATION/GAIT: Gait narrow based and stable.   Data: MRI brain 12/20/2012:  Subcentimeter left posterior frontal extra-axial mass lesion, likely incidental meningioma. Otherwise negative exam. MRA circle of willis and carotids 12/20/2012:  Patent vasculature   IMPRESSION: 1. 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 Episodic migraine, about twice per month 3. Bipolar depression, followed by psychiatry   - She takes a number of medications including lamictal 25mg , prozac 20mg , xanax 1mg , zyprexa 5mg , and lithium 450mg  4. Tobacco use disorder    PLAN/RECOMMENDATIONS:  1.  Start Qudexy 100mg  XR daily 2.  Discussed Botox going forward 3.  Continue Ansaid 50mg  as needed 4.  MRI/A brain wo contrast due to worsening headaches  Return to clinic in 4-months, or sooner as needed  The duration of this  appointment visit was 30 minutes of face-to-face time with the patient.  Greater than 50% of this time was spent in counseling, explanation of diagnosis, planning of further management, and coordination of care.   Thank you for allowing me to participate in patient's care.  If I can answer any additional questions, I would be pleased to do so.    Sincerely,    Tomorrow Dehaas K. Posey Pronto, DO

## 2015-08-01 ENCOUNTER — Telehealth: Payer: Self-pay | Admitting: Neurology

## 2015-08-01 NOTE — Telephone Encounter (Signed)
Angelica Diaz 1980-04-07 she called wanting to know if she can take both Qudexy (seizure med) and Lamictal (seizure med also used to treat her depression). Her Psychiatrist put  her on [place] and  Dr. Posey Pronto prescribed the Qudexy. She wanted to make sure she was ok to take both together. Her number is [phone]. Thank you

## 2015-08-02 NOTE — Telephone Encounter (Signed)
Patient notified

## 2015-08-02 NOTE — Telephone Encounter (Signed)
Yes, that would be okay.  Angelica Phillips K. Posey Pronto, DO

## 2015-08-02 NOTE — Telephone Encounter (Signed)
Please advise 

## 2015-08-05 ENCOUNTER — Other Ambulatory Visit: Payer: BLUE CROSS/BLUE SHIELD

## 2015-08-07 ENCOUNTER — Telehealth: Payer: Self-pay | Admitting: Neurology

## 2015-08-07 NOTE — Telephone Encounter (Signed)
I spoke with patient and she said that she does not have medicaid.  She has [place].  I will submit this under her insurance and call her with the next appointment.

## 2015-08-07 NOTE — Telephone Encounter (Signed)
Angelica Diaz [birth year]. She was calling about an MRI that was cancelled due to  Insurance not being able to be authorized. She was told to follow up with Dr. Serita Grit office.  Her number is [phone]. Thank you

## 2015-08-10 ENCOUNTER — Ambulatory Visit (INDEPENDENT_AMBULATORY_CARE_PROVIDER_SITE_OTHER): Payer: BLUE CROSS/BLUE SHIELD | Admitting: Physician Assistant

## 2015-08-10 ENCOUNTER — Telehealth: Payer: Self-pay | Admitting: Neurology

## 2015-08-10 ENCOUNTER — Encounter: Payer: Self-pay | Admitting: Physician Assistant

## 2015-08-10 VITALS — BP 121/78 | HR 81 | Temp 98.4°F | Resp 16 | Ht 68.0 in | Wt 214.2 lb

## 2015-08-10 DIAGNOSIS — L259 Unspecified contact dermatitis, unspecified cause: Secondary | ICD-10-CM | POA: Diagnosis not present

## 2015-08-10 MED ORDER — TRIAMCINOLONE ACETONIDE 0.1 % EX CREA: 1.0000 "application " | TOPICAL_CREAM | Freq: Two times a day (BID) | CUTANEOUS | Status: DC

## 2015-08-10 NOTE — Progress Notes (Signed)
Patient presents to clinic today c/o pruritic rash of forearms x 1.5 weeks. Denies known exposure to irritant. Denies pet in the home. Denies sick contact with similiar symptoms.   Past Medical History  Diagnosis Date  . Depression   . Headache(784.0)   . Anxiety   . Sleep disorder   . Migraine   . Arthritis   . Asthma     hx of in teens   . PONV (postoperative nausea and vomiting)     and irritability after oral surgeries     Current Outpatient Prescriptions on File Prior to Visit  Medication Sig Dispense Refill  . ALPRAZolam (XANAX) 1 MG tablet take 1 tablet by mouth twice a day if needed  0  . flurbiprofen (ANSAID) 50 MG tablet Take 1 tablet twice daily as needed, no more than twice per week. 20 tablet 5  . Norethindrone-Mestranol (NECON) 1-50 MG-MCG tablet Take 1 tablet by mouth daily. 1 Package 11  . Topiramate ER (QUDEXY XR) 100 MG CS24 Take 1 capsule by mouth daily. 30 each 0  . Vitamin D, Ergocalciferol, (DRISDOL) 50000 units CAPS capsule   1  . zolpidem (AMBIEN) 10 MG tablet Take 10 mg by mouth at bedtime as needed for sleep.     No current facility-administered medications on file prior to visit.    No Known Allergies  Family History  Problem Relation Age of Onset  . Diabetes Maternal Grandfather   . Migraines Mother   . Migraines Son   . Colon cancer Neg Hx   . Throat cancer Neg Hx   . Pancreatic cancer Neg Hx   . Stomach cancer Neg Hx   . Heart disease Neg Hx   . Kidney disease Neg Hx   . Liver disease Neg Hx     Social History   Social History  . Marital Status: Single    Spouse Name: N/A  . Number of Children: 2  . Years of Education: N/A   Occupational History  . Insurance agent    Social History Main Topics  . Smoking status: Former Smoker -- 0.50 packs/day for 18 years    Types: Cigarettes  . Smokeless tobacco: Never Used     Comment: form given 08-15-13  . Alcohol Use: No     Comment: Occasionally 2-3 times per year   . Drug Use: No   . Sexual Activity: Not Asked   Other Topics Concern  . None   Social History Narrative   Currently sells insurance.   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  She has two healthy children.    Review of Systems - See HPI.  All other ROS are negative.  BP 121/78 mmHg  Pulse 81  Temp(Src) 98.4 F (36.9 C) (Oral)  Resp 16  Ht 5\' 8"  (1.727 m)  Wt 214 lb 4 oz (97.183 kg)  BMI 32.58 kg/m2  SpO2 98%  LMP 08/03/2015  Physical Exam  Recent Results (from the past 2160 hour(s))  Basic metabolic panel     Status: None   Collection Time: 05/14/15 12:00 AM  Result Value Ref Range   Glucose 92 mg/dL   BUN 19 4 - 21 mg/dL   Creatinine 0.8 0.5 - 1.1 mg/dL   Potassium 4.3 3.4 - 5.3 mmol/L   Sodium 139 137 - 147 mmol/L  Lipid panel     Status: Abnormal   Collection Time: 05/14/15 12:00 AM  Result Value Ref Range   Triglycerides 196 (A) 40 - 160 mg/dL  Cholesterol 268 (A) 0 - 200 mg/dL   HDL 81 (A) 35 - 70 mg/dL   LDL Cholesterol 102 mg/dL  Hepatic function panel     Status: Abnormal   Collection Time: 05/14/15 12:00 AM  Result Value Ref Range   Alkaline Phosphatase 67 25 - 125 U/L   ALT 39 (A) 7 - 35 U/L   AST 35 13 - 35 U/L   Bilirubin, Total 0.3 mg/dL    Assessment/Plan: 1. Contact dermatitis Apply Kenalog BID. Sarna lotion recommended.Benadryl PO at night. Instructed patient to wash linens and make sure pet is up-to-date on flea prevention.  - triamcinolone cream (KENALOG) 0.1 %; Apply 1 application topically 2 (two) times daily.  Dispense: 30 g; Refill: 0

## 2015-08-10 NOTE — Telephone Encounter (Signed)
-----   Message from Thurmon Fair, [place] sent at 08/08/2015 11:04 AM EDT ----- Dr. Posey Pronto,  Patient was approved for MRI Brain.  MRA needs peer to peer  [phone]  Patient ID: [id number]

## 2015-08-10 NOTE — Progress Notes (Signed)
Pre visit review using our clinic review tool, if applicable. No additional management support is needed unless otherwise documented below in the visit note/SLS  

## 2015-08-10 NOTE — Patient Instructions (Signed)
Please apply the cream twice daily as directed over the next week. Keep skin clean and dry. Use OTC Sarna lotion if needed for itch. Use a Benadryl in the evening if needed.  Wash linens and store for 1 week as directed.  Follow-up with me if symptoms are not resolving.

## 2015-08-10 NOTE — Telephone Encounter (Signed)
Called patient to let her know that her scans has been approved through [facility] and she can call [facility] to go ahead and get those scheduled.

## 2015-08-10 NOTE — Telephone Encounter (Signed)
MRI brain and MRA head approved [id number], expires 09/06/2015.  Valid at [facility].    Donika K. Posey Pronto, DO

## 2015-08-19 ENCOUNTER — Ambulatory Visit
Admission: RE | Admit: 2015-08-19 | Discharge: 2015-08-19 | Disposition: A | Discharge status: Home / Self Care | Payer: BLUE CROSS/BLUE SHIELD | Source: Ambulatory Visit | Attending: Neurology | Admitting: Neurology

## 2015-08-19 DIAGNOSIS — R519 Headache, unspecified: Secondary | ICD-10-CM

## 2015-08-19 DIAGNOSIS — R51 Headache: Principal | ICD-10-CM

## 2015-08-19 DIAGNOSIS — G43109 Migraine with aura, not intractable, without status migrainosus: Secondary | ICD-10-CM

## 2015-08-20 ENCOUNTER — Encounter: Payer: Self-pay | Admitting: *Deleted

## 2015-08-20 ENCOUNTER — Ambulatory Visit: Payer: BLUE CROSS/BLUE SHIELD | Admitting: Physician Assistant

## 2015-08-20 NOTE — Telephone Encounter (Signed)
Angelica Diaz [birth year]. She would like you to call her. She didn't completely understand everything about her MRI results. Her number is [phone]. Thank you

## 2015-08-20 NOTE — Telephone Encounter (Signed)
Returned call to patient and discussed results.  

## 2015-09-04 ENCOUNTER — Encounter: Payer: Self-pay | Admitting: Neurology

## 2015-09-07 ENCOUNTER — Ambulatory Visit (INDEPENDENT_AMBULATORY_CARE_PROVIDER_SITE_OTHER): Payer: BLUE CROSS/BLUE SHIELD | Admitting: Neurology

## 2015-09-07 ENCOUNTER — Encounter: Payer: Self-pay | Admitting: Neurology

## 2015-09-07 VITALS — BP 120/88 | HR 92 | Ht 68.0 in | Wt 216.0 lb

## 2015-09-07 DIAGNOSIS — R519 Headache, unspecified: Secondary | ICD-10-CM

## 2015-09-07 DIAGNOSIS — R51 Headache: Secondary | ICD-10-CM | POA: Diagnosis not present

## 2015-09-07 DIAGNOSIS — G43109 Migraine with aura, not intractable, without status migrainosus: Secondary | ICD-10-CM

## 2015-09-07 MED ORDER — ZONISAMIDE 25 MG PO CAPS: 25.0000 mg | ORAL_CAPSULE | Freq: Every day | ORAL | Status: DC

## 2015-09-07 MED ORDER — PROCHLORPERAZINE MALEATE 10 MG PO TABS: 10.0000 mg | ORAL_TABLET | Freq: Four times a day (QID) | ORAL | Status: DC | PRN

## 2015-09-07 NOTE — Patient Instructions (Addendum)
1.  Start zonisamide 25mg  daily 2.  For abortive therapy, take compazine 10mg  in combination with Ansaid 50mg   Return to clinic in 2 months

## 2015-09-07 NOTE — Progress Notes (Signed)
[facility]  Follow-up Visit   Date: 09/07/2015    Angelica Diaz MRN: [id number] DOB: 01-12-1981   Interim History:e Angelica Diaz is a 35 y.o. year-old right-handed Caucasian female with history of anxiety and bipolar depression returning to the clinic for follow-up of migraines and chronic daily headaches.  The patient was accompanied to the clinic by self.     History of present illness: Intial visit 12/10/2012:  Patient started having migraines around the age of 85. As a teenager to the age of 68, they would occur once per week, but this has improved since then. Pain is holocephalic and throbbing, lasting 1-3 days and occuring once every 4-6 weeks. She endorses photophobia, phonophobia, nausea, vomiting, and lack of appetite. No flashing lights, numbness/tingling, or weakness. Stress exacerbates symptoms. Usually, she takes ibuprofen and sleeps, which helps. She takes ibuprofen 4-8 pills about 2-3x per week. She has previously tried hydrocodone, reglan, and was given a prescription for zomig which she never filled.   On September 3, she was was celebrating her daughter's birthday and her entire face went completely numb. She had associated slurring of her speech and she broke out in a cold sweat. This lasted 20 minutes. She sat down, otherwise she would have passed out. About an hour later, she had the worst migraine she has ever encountered. She placed an ice pack on her head, took ibuprofen, and tried to lay down. Pain slowly resolved by the following afternoon. On September 10, she went to the emergency department because she still was not feeling back to her baseline and continued to have a dull holocephalic headache and new word-finding problems. She remembers talking and not being able to get the words out. She had CT brain, but we do not have the results to review. She received a headache cocktail, without significant relief.  MRI/A brain and carotids which  did not show any abnormalities, except a small 79mm x 34mm x 63mm left frontal meningioma without mass effect.   No relief with propranolol so started on topiramate and did very well on 50mg  BID, but did start experiencing paresthesias.  She uses Ansaid for severe headaches which she gets about once per week.  There was no benefit with sumatriptan.   - UPDATE 07/31/2014:  She is taking nortriptyline 20mg  qhs and unfortunately has not noticed any improvement in her headaches.  She continues to have headaches about 4 times per week, lasting all day.    - UPDATE 10/25/2014:  She feels that her headaches are better and her paresthesias have resolved after switching to Trokendi.   She quit smoking on Memorial Day!    - UPDATE 07/31/2015:  She reports stopping Trokendi last year because the tablets were too big for her to swallow.  She is currently not taking anything for preventative headaches.  She takes Ansaid a few times per month.  She now had a dull headaches daily and migraines about once per week.  Last week, she developed severe pressure-like sensation as if her brains were going to explode through her skull.  She is very tearful talking about the severity of the pain, which lasted two days.  She did not go to the emergency department because she has no one to watch her son.  She did not have any weakness, numbness, tingling, vision or speech changes.  She is very concerned because this was the worst headache she has ever had and it woke her up from sleeping.  She has started smoking several months ago.   - UPDATE 09/07/2015:   She continues to have a daily headache, which she is used to, but her migraines continue to be most disabling.  She gets very tearful talking about her migraines.  She stopped Qudexy because of side effects of paresthesias, so called to make a sooner appointment.   Medications:  Current Outpatient Prescriptions on File Prior to Visit  Medication Sig Dispense Refill  . ALPRAZolam  (XANAX) 1 MG tablet take 1 tablet by mouth twice a day if needed  0  . flurbiprofen (ANSAID) 50 MG tablet Take 1 tablet twice daily as needed, no more than twice per week. 20 tablet 5  . Norethindrone-Mestranol (NECON) 1-50 MG-MCG tablet Take 1 tablet by mouth daily. 1 Package 11  . Probiotic Product (PROBIOTIC & ACIDOPHILUS EX ST) CAPS Take by mouth daily.    . sertraline (ZOLOFT) 100 MG tablet Take 100 mg by mouth daily.    Marland Kitchen triamcinolone cream (KENALOG) 0.1 % Apply 1 application topically 2 (two) times daily. 30 g 0  . Vitamin D, Ergocalciferol, (DRISDOL) 50000 units CAPS capsule   1  . zolpidem (AMBIEN) 10 MG tablet Take 10 mg by mouth at bedtime as needed for sleep.     No current facility-administered medications on file prior to visit.    Allergies: No Known Allergies   Review of Systems:  CONSTITUTIONAL: No fevers, chills, night sweats, or weight loss.   EYES: No visual changes or eye pain ENT: No hearing changes.  No history of nose bleeds.   RESPIRATORY: No cough, wheezing and shortness of breath.   CARDIOVASCULAR: Negative for chest pain, and palpitations.   GI: Negative for abdominal discomfort, blood in stools or black stools.  No recent change in bowel habits.   GU:  No history of incontinence.   MUSCLOSKELETAL: No history of joint pain or swelling.  No myalgias.   SKIN: Negative for lesions, rash, and itching.   HEMATOLOGY/ONCOLOGY: Negative for prolonged bleeding, bruising easily, and swollen nodes.   ENDOCRINE: Negative for cold or heat intolerance, polydipsia or goiter.   PSYCH:  +depression or anxiety symptoms.   NEURO: As Above.   Vital Signs:  BP 120/88 mmHg  Pulse 92  Ht 5\' 8"  (1.727 m)  Wt 216 lb (97.977 kg)  BMI 32.85 kg/m2  SpO2 97%  LMP 08/03/2015  Neurological Exam: MENTAL STATUS:  Awake, alert, oriented x 3. Speech is not dysarthric.    CRANIAL NERVES:  Face is symmetric  MOTOR:  Motor strength is 5/5 in all four extremities.     COORDINATION/GAIT: Gait narrow based and stable.   Data: MRI brain 12/20/2012: Subcentimeter left posterior frontal extra-axial mass lesion, likely incidental meningioma. Otherwise negative exam. MRA circle of willis and carotids 12/20/2012:  Patent vasculature  MRI/A brain 08/19/2015: 1. Stable meningioma over the left frontal convexity. Maximal dimension is 9 mm. Next item otherwise normal MRI of the brain. Next item 2. Otherwise normal MRI of the brain. 3. Signal loss in the distal hypoplastic right vertebral artery in the left vertebral artery above the PICA origin. The changed from the prior study is likely artifactual. 4. 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 5. Otherwise normal MRA circle of Willis without significant proximal stenosis, aneurysm, or branch vessel occlusion.   IMPRESSION: 1. 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 Episodic migraine, about twice per month  3. Bipolar depression, followed by psychiatry   4. Tobacco use disorder    PLAN/RECOMMENDATIONS:  1.  Start zonisamide 25mg  daily 2.  For abortive therapy, take compazine 10mg  in combination with Ansaid 50mg  3.  Patient declined Botox   Return to clinic in 42-months, or sooner as needed  The duration of this appointment visit was 30 minutes of face-to-face time with the patient.  Greater than 50% of this time was spent in counseling, explanation of diagnosis,  planning of further management, and coordination of care.   Thank you for allowing me to participate in patient's care.  If I can answer any additional questions, I would be pleased to do so.    Sincerely,    Donika K. Posey Pronto, DO

## 2015-10-05 ENCOUNTER — Encounter: Payer: Self-pay | Admitting: Neurology

## 2015-10-29 ENCOUNTER — Encounter: Payer: Self-pay | Admitting: Neurology

## 2015-10-30 ENCOUNTER — Encounter: Payer: Self-pay | Admitting: Neurology

## 2015-11-09 ENCOUNTER — Ambulatory Visit: Payer: BLUE CROSS/BLUE SHIELD | Admitting: Neurology

## 2015-12-04 ENCOUNTER — Encounter: Payer: Self-pay | Admitting: Physician Assistant

## 2015-12-04 ENCOUNTER — Ambulatory Visit (INDEPENDENT_AMBULATORY_CARE_PROVIDER_SITE_OTHER): Payer: BLUE CROSS/BLUE SHIELD | Admitting: Physician Assistant

## 2015-12-04 VITALS — BP 122/88 | HR 75 | Temp 98.1°F | Resp 16 | Ht 68.0 in | Wt 213.2 lb

## 2015-12-04 DIAGNOSIS — H6991 Unspecified Eustachian tube disorder, right ear: Secondary | ICD-10-CM

## 2015-12-04 MED ORDER — LORATADINE-PSEUDOEPHEDRINE ER 10-240 MG PO TB24: 1.0000 | ORAL_TABLET | Freq: Every day | ORAL | 0 refills | Status: DC

## 2015-12-04 MED ORDER — FLUTICASONE PROPIONATE 50 MCG/ACT NA SUSP: 2.0000 | Freq: Every day | NASAL | 6 refills | Status: DC

## 2015-12-04 NOTE — Progress Notes (Signed)
Patient presents to clinic today c/o 6 weeks of R ear pressure with a sensation of fluid in the ear. Denies fever, chills, ear pain, drainage, tinnitus. Denies symptoms of L ear. Denies cough, congestion or other URI symptoms. Has not taken anything for symptoms.  Past Medical History:  Diagnosis Date  . Anxiety   . Arthritis   . Asthma    hx of in teens   . Depression   . Headache(784.0)   . Migraine   . PONV (postoperative nausea and vomiting)    and irritability after oral surgeries   . Sleep disorder     Current Outpatient Prescriptions on File Prior to Visit  Medication Sig Dispense Refill  . ALPRAZolam (XANAX) 1 MG tablet take 1 tablet by mouth twice a day if needed  0  . ARIPiprazole (ABILIFY) 2 MG tablet Take 2 mg by mouth at bedtime.  0  . flurbiprofen (ANSAID) 50 MG tablet Take 1 tablet twice daily as needed, no more than twice per week. 20 tablet 5  . Norethindrone-Mestranol (NECON) 1-50 MG-MCG tablet Take 1 tablet by mouth daily. 1 Package 11  . Probiotic Product (PROBIOTIC & ACIDOPHILUS EX ST) CAPS Take by mouth daily.    . prochlorperazine (COMPAZINE) 10 MG tablet Take 1 tablet (10 mg total) by mouth every 6 (six) hours as needed for nausea or vomiting (Take at onset of severe migraine). 20 tablet 0  . sertraline (ZOLOFT) 100 MG tablet Take 100 mg by mouth daily.    Marland Kitchen zolpidem (AMBIEN) 10 MG tablet Take 10 mg by mouth at bedtime as needed for sleep.    Marland Kitchen zonisamide (ZONEGRAN) 25 MG capsule Take 1 capsule (25 mg total) by mouth daily. 30 capsule 5   No current facility-administered medications on file prior to visit.     No Known Allergies  Family History  Problem Relation Age of Onset  . Diabetes Maternal Grandfather   . Migraines Mother   . Migraines Son   . Colon cancer Neg Hx   . Throat cancer Neg Hx   . Pancreatic cancer Neg Hx   . Stomach cancer Neg Hx   . Heart disease Neg Hx   . Kidney disease Neg Hx   . Liver disease Neg Hx     Social History    Social History  . Marital status: Single    Spouse name: N/A  . Number of children: 2  . 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 Smoking status: Former Smoker    Packs/day: 0.50    Years: 18.00    Types: Cigarettes  . Smokeless tobacco: Never Used     Comment: form given 08-15-13  . Alcohol use No     Comment: Occasionally 2-3 times per year   . Drug use: No  . Sexual activity: Not Asked   Other Topics Concern  . None   Social History Narrative   Currently sells insurance.   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  She has two healthy children.   Review of Systems - See HPI.  All other ROS are negative.  BP 122/88 (BP Location: Right Arm, Patient Position: Sitting, Cuff Size: Large)   Pulse 75   Temp 98.1 F (36.7 C) (Oral)   Resp 16   Ht 5\' 8"  (1.727 m)   Wt 213 lb 4 oz (96.7 kg)   LMP 11/28/2015   SpO2 98%  BMI 32.42 kg/m   Physical Exam  Constitutional: She is oriented to person, place, and time and well-developed, well-nourished, and in no distress.  HENT:  Head: Normocephalic and atraumatic.  Right Ear: Tympanic membrane is not erythematous and not bulging. A middle ear effusion is present.  Left Ear: Tympanic membrane normal.  Nose: Nose normal.  Mouth/Throat: Uvula is midline, oropharynx is clear and moist and mucous membranes are normal.  Eyes: Conjunctivae are normal.  Neck: Neck supple.  Cardiovascular: Normal rate, regular rhythm, normal heart sounds and intact distal pulses.   Pulmonary/Chest: Effort normal and breath sounds normal. No respiratory distress. She has no wheezes. She has no rales. She exhibits no tenderness.  Neurological: She is alert and oriented to person, place, and time.  Skin: Skin is warm and dry. No rash noted.  Psychiatric: Affect normal.  Vitals reviewed.   Assessment/Plan: 1. Eustachian tube disorder, right Rx Flonase. Start Claritin D over the next 4-5  days to help dry up this fluid. No evidence of AOM on examination. FU if not resolving.  - fluticasone (FLONASE) 50 MCG/ACT nasal spray; Place 2 sprays into both nostrils daily.  Dispense: 16 g; Refill: 6 - loratadine-pseudoephedrine (CLARITIN-D 24 HOUR) 10-240 MG 24 hr tablet; Take 1 tablet by mouth daily.  Dispense: 30 tablet; Refill: 0   Leeanne Rio, [place]

## 2015-12-04 NOTE — Patient Instructions (Signed)
Please start the Flonase daily as directed. Also start the Claritin-D once daily over the next week. This combination should help relieve your symptoms. If symptoms are not improving, please call or come see me.

## 2016-02-27 ENCOUNTER — Telehealth: Payer: Self-pay | Admitting: Physician Assistant

## 2016-02-27 NOTE — Telephone Encounter (Signed)
Patient would like to transfer from [place] to [place] due to the location being to far, please advise

## 2016-02-27 NOTE — Telephone Encounter (Signed)
Ok with me 

## 2016-02-27 NOTE — Telephone Encounter (Signed)
Ok to transfer. 

## 2016-02-28 ENCOUNTER — Ambulatory Visit (INDEPENDENT_AMBULATORY_CARE_PROVIDER_SITE_OTHER): Payer: BLUE CROSS/BLUE SHIELD | Admitting: Medical

## 2016-02-28 ENCOUNTER — Encounter: Payer: Self-pay | Admitting: Medical

## 2016-02-28 VITALS — BP 116/82 | HR 67 | Temp 98.3°F | Ht 68.0 in | Wt 204.2 lb

## 2016-02-28 DIAGNOSIS — D329 Benign neoplasm of meninges, unspecified: Secondary | ICD-10-CM

## 2016-02-28 DIAGNOSIS — F411 Generalized anxiety disorder: Secondary | ICD-10-CM

## 2016-02-28 DIAGNOSIS — K649 Unspecified hemorrhoids: Secondary | ICD-10-CM

## 2016-02-28 DIAGNOSIS — F329 Major depressive disorder, single episode, unspecified: Secondary | ICD-10-CM

## 2016-02-28 DIAGNOSIS — F909 Attention-deficit hyperactivity disorder, unspecified type: Secondary | ICD-10-CM | POA: Diagnosis not present

## 2016-02-28 DIAGNOSIS — F32A Depression, unspecified: Secondary | ICD-10-CM

## 2016-02-28 MED ORDER — HYDROCORTISONE ACETATE 25 MG RE SUPP: 25.0000 mg | Freq: Two times a day (BID) | RECTAL | 0 refills | Status: DC

## 2016-02-28 NOTE — Progress Notes (Addendum)
Subjective:    Patient ID: Angelica Diaz, female    DOB: 10-18-1980, 35 y.o.   MRN: [id number]  HPI   Pt in for transfer of care from [place].  She feels good today.  Pt works Insurance underwriter, Teacher, adult education) 2 times a week, pt states diet healthy, 2 kids(19 yo, 62 yo)  Pt is on sertraline for mood and anxiety. Controlled for 10 years or more. She has been on sertraline 100 mg q day. This is the ssri that helped the most. Pt also used xanax in the past over last year. She used 1 mg bid for the past year.   Pt also has history of insomnia. She has been on [place] for 2 years.   Pt also has ADD. Pt on adderal. Written by psychiatrist.  Pt sees psychiatrist and he writes the above meds  Pt has history of menigioma dx for 3 yrs. Size has been stable.  LMP- one week ago. Pt on contraceptive.  Pt declines flu vaccine.  Pt has gynecologist.(Recent pap in the summer was normal)  Pt states also hemorrhoid for years on and off for 2.5 years. Recently past 3 weeks the henmorhoid not resolving. Pt use some otc prep h. Just itches. The area does not hurt. Pt denies any constipation. States just itches and not flaring down completely.     Review of Systems  Constitutional: Negative for chills, fatigue and fever.  Respiratory: Negative for chest tightness, shortness of breath, wheezing and stridor.   Cardiovascular: Negative for chest pain and palpitations.  Gastrointestinal: Negative for abdominal pain, blood in stool, constipation, diarrhea, nausea and vomiting.       Rectal area- one hemorrhoid which she states is palpable and it itches.   Musculoskeletal: Negative for back pain.  Skin: Negative for rash.  Neurological: Negative for dizziness, speech difficulty and weakness.  Hematological: Negative for adenopathy. Does not bruise/bleed easily.  Psychiatric/Behavioral: Positive for dysphoric mood and sleep disturbance. Negative for behavioral problems, confusion and suicidal  ideas. The patient is nervous/anxious.        But these areas are controlled now.   Past Medical History:  Diagnosis Date  . Anxiety   . Arthritis   . Asthma    hx of in teens   . Depression   . Headache(784.0)   . Migraine   . PONV (postoperative nausea and vomiting)    and irritability after oral surgeries   . Sleep disorder      Social History   Social History  . Marital status: Single    Spouse name: N/A  . Number of children: 2  . 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 Smoking status: Former Smoker    Packs/day: 0.50    Years: 18.00    Types: Cigarettes  . Smokeless tobacco: Never Used     Comment: form given 08-15-13  . Alcohol use No     Comment: Occasionally 2-3 times per year   . Drug use: No  . Sexual activity: Not on file   Other Topics Concern  . Not on file   Social History Narrative   Currently sells insurance.   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  She has two healthy children.    Past Surgical History:  Procedure Laterality Date  . CESAREAN SECTION    . CHOLECYSTECTOMY N/A 05/27/2013   Procedure: LAPAROSCOPIC CHOLECYSTECTOMY WITH INTRAOPERATIVE CHOLANGIOGRAM;  Surgeon: Merlinda Frederick  Gerkin, MD;  Location: [facility];  Service: General;  Laterality: N/A;  . oral surgeries     . WISDOM TOOTH EXTRACTION      Family History  Problem Relation Age of Onset  . Diabetes Maternal Grandfather   . Migraines Mother   . Migraines Son   . Colon cancer Neg Hx   . Throat cancer Neg Hx   . Pancreatic cancer Neg Hx   . Stomach cancer Neg Hx   . Heart disease Neg Hx   . Kidney disease Neg Hx   . Liver disease Neg Hx     No Known Allergies  Current Outpatient Prescriptions on File Prior to Visit  Medication Sig Dispense Refill  . ALPRAZolam (XANAX) 1 MG tablet take 1 tablet by mouth twice a day if needed  0  . amphetamine-dextroamphetamine (ADDERALL XR) 20 MG 24 hr capsule Take 20 mg by mouth  every morning. Patient takes as needed.    . flurbiprofen (ANSAID) 50 MG tablet Take 1 tablet twice daily as needed, no more than twice per week. 20 tablet 5  . Norethindrone-Mestranol (NECON) 1-50 MG-MCG tablet Take 1 tablet by mouth daily. 1 Package 11  . Probiotic Product (PROBIOTIC & ACIDOPHILUS EX ST) CAPS Take by mouth daily.    . prochlorperazine (COMPAZINE) 10 MG tablet Take 1 tablet (10 mg total) by mouth every 6 (six) hours as needed for nausea or vomiting (Take at onset of severe migraine). 20 tablet 0  . sertraline (ZOLOFT) 100 MG tablet Take 100 mg by mouth daily.    Marland Kitchen zolpidem (AMBIEN) 10 MG tablet Take 10 mg by mouth at bedtime as needed for sleep.     No current facility-administered medications on file prior to visit.     BP 116/82 (BP Location: Left Arm, Patient Position: Sitting, Cuff Size: Normal)   Pulse 67   Temp 98.3 F (36.8 C) (Oral)   Ht 5\' 8"  (1.727 m)   Wt 204 lb 3.2 oz (92.6 kg)   LMP 02/21/2016   SpO2 98%   BMI 31.05 kg/m       Objective:   Physical Exam  General Mental Status- Alert. General Appearance- Not in acute distress.   Skin General: Color- Normal Color. Moisture- Normal Moisture.  Neck Carotid Arteries- Normal color. Moisture- Normal Moisture. No carotid bruits. No JVD.  Chest and Lung Exam Auscultation: Breath Sounds:-Normal.  Cardiovascular Auscultation:Rythm- Regular. Murmurs & Other Heart Sounds:Auscultation of the heart reveals- No Murmurs.  Abdomen Inspection:-Inspeection Normal. Palpation/Percussion:Note:No mass. Palpation and Percussion of the abdomen reveal- Non Tender, Non Distended + BS, no rebound or guarding.  Neurologic Cranial Nerve exam:- CN III-XII intact(No nystagmus), symmetric smile. Strength:- 5/5 equal and symmetric strength both upper and lower extremities.  Rectal- presently at this time small hemorrhoid at 4 oclock position.(pt note was larger but won't resolve completely)      Assessment & Plan:    Continue your current meds prescribed by you psychiatrist. If you mood changes and worsens and your psychiatrist is not available please call or [place] come in.  Continue to follow up with neurologist for the meningioma.  Your hemorrhoid appears small presently and hopefully with anusol hc suppository it will flare down completely. If not let us know and could refer you to specialist  Follow up 10-14 days if hemorroid persist but sooner if worsens.  Also would recommend CPE in 1-2 month at your convenience. Exam could be done early in the morning. Fasting lab that day  to check you cholesterol  Jaydin Jalomo, Percell Miller, [place]

## 2016-02-28 NOTE — Patient Instructions (Addendum)
Continue your current meds prescribed by you psychiatrist. If you mood changes and worsens and your psychiatrist is not available please call or [place] come in.  Continue to follow up with neurologist for the meningioma.  Your hemorrhoid appears small presently and hopefully with anusol hc suppository it will flare down completely. If not let us know and could refer you to specialist  Follow up 10-14 days if hemorroid persist but sooner if worsens.  Also would recommend CPE in 1-2 month at your convenience. Exam could be done early in the morning. Fasting lab that day to check you cholesterol

## 2016-02-28 NOTE — Telephone Encounter (Signed)
Patient scheduled for 02/28/2016 with Percell Miller

## 2016-02-28 NOTE — Progress Notes (Signed)
Pre visit review using our clinic review tool, if applicable. No additional management support is needed unless otherwise documented below in the visit note. 

## 2016-02-29 ENCOUNTER — Encounter: Payer: Self-pay | Admitting: Medical

## 2016-02-29 MED ORDER — HYDROCORTISONE ACE-PRAMOXINE 1-1 % RE FOAM: 1.0000 | Freq: Two times a day (BID) | RECTAL | 0 refills | Status: DC

## 2016-03-15 ENCOUNTER — Other Ambulatory Visit: Payer: Self-pay | Admitting: Family Medicine

## 2016-03-28 ENCOUNTER — Ambulatory Visit: Payer: BLUE CROSS/BLUE SHIELD | Admitting: Physician Assistant

## 2016-03-31 HISTORY — PX: OTHER SURGICAL HISTORY: SHX169

## 2016-06-09 ENCOUNTER — Encounter: Payer: Self-pay | Admitting: Medical

## 2016-06-12 ENCOUNTER — Ambulatory Visit (INDEPENDENT_AMBULATORY_CARE_PROVIDER_SITE_OTHER): Payer: BLUE CROSS/BLUE SHIELD | Admitting: Medical

## 2016-06-12 VITALS — BP 105/70 | HR 79 | Temp 98.5°F | Ht 65.8 in | Wt 216.2 lb

## 2016-06-12 DIAGNOSIS — H6122 Impacted cerumen, left ear: Secondary | ICD-10-CM | POA: Diagnosis not present

## 2016-06-12 DIAGNOSIS — H6981 Other specified disorders of Eustachian tube, right ear: Secondary | ICD-10-CM | POA: Diagnosis not present

## 2016-06-12 MED ORDER — FLUTICASONE PROPIONATE 50 MCG/ACT NA SUSP: 2.0000 | Freq: Every day | NASAL | 1 refills | Status: DC

## 2016-06-12 MED ORDER — AZELASTINE HCL 0.1 % NA SOLN: 2.0000 | Freq: Two times a day (BID) | NASAL | 3 refills | Status: DC

## 2016-06-12 NOTE — Patient Instructions (Addendum)
You had minimal wax present in rt ear canal but that was cleared today with no complications. But no effect on pressure like sensation(under water sensation).  I want you to use both flonase and astelin spray as directed. You have had this a long time/since september. If symptoms not completely improved by 2 weeks  then would refer to ENT.  Left side cerumen impaction. Moderate amount(majority of wax cleared out). Nurse lavaged successfully today. If you ever get blocked sensation in left ear let us know we could recheck for wax and clear if wax present.  Follow up 2 weeks or as needed.

## 2016-06-12 NOTE — Progress Notes (Signed)
Pre visit review using our clinic tool,if applicable. No additional management support is needed unless otherwise documented below in the visit note.  

## 2016-06-12 NOTE — Progress Notes (Signed)
Subjective:    Patient ID: Angelica Diaz, female    DOB: 04-19-1980, 36 y.o.   MRN: [id number]  HPI  Pt in with some rt ear pressure since the fall. Even before September. Pt was treated with flonase and claritin D. She feels like underwater at times. With flonase felt sight better but then stopped and ear sensation returned again.  No sneezing, no itchy eyes and no runny nose. Pt does not feel any nasal congestion.  Pt used one bottle of flonase.  So overall at least 6 months with pressure sensation.   Review of Systems  Constitutional: Negative for chills and fatigue.  HENT: Negative for congestion, ear pain, postnasal drip, sinus pain, sinus pressure and sore throat.        Ear pressure rt side for months.  Respiratory: Negative for choking, shortness of breath and wheezing.   Cardiovascular: Negative for chest pain and palpitations.  Gastrointestinal: Negative for abdominal pain.  Musculoskeletal: Negative for back pain.  Skin: Negative for rash.  Neurological: Negative for dizziness and headaches.  Hematological: Negative for adenopathy. Does not bruise/bleed easily.  Psychiatric/Behavioral: Negative for behavioral problems and confusion.    Past Medical History:  Diagnosis Date  . Anxiety   . Arthritis   . Asthma    hx of in teens   . Depression   . Headache(784.0)   . Migraine   . PONV (postoperative nausea and vomiting)    and irritability after oral surgeries   . Sleep disorder      Social History   Social History  . Marital status: Single    Spouse name: N/A  . Number of children: 2  . 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 Smoking status: Former Smoker    Packs/day: 0.50    Years: 18.00    Types: Cigarettes  . Smokeless tobacco: Never Used     Comment: form given 08-15-13  . Alcohol use No     Comment: Occasionally 2-3 times per year   . Drug use: No  . Sexual  activity: Not on file   Other Topics Concern  . Not on file   Social History Narrative   Currently sells insurance.   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  She has two healthy children.    Past Surgical History:  Procedure Laterality Date  . CESAREAN SECTION    . CHOLECYSTECTOMY N/A 05/27/2013   Procedure: LAPAROSCOPIC CHOLECYSTECTOMY WITH INTRAOPERATIVE CHOLANGIOGRAM;  Surgeon: Earnstine Regal, MD;  Location: [facility];  Service: General;  Laterality: N/A;  . oral surgeries     . WISDOM TOOTH EXTRACTION      Family History  Problem Relation Age of Onset  . Diabetes Maternal Grandfather   . Migraines Mother   . Migraines Son   . Colon cancer Neg Hx   . Throat cancer Neg Hx   . Pancreatic cancer Neg Hx   . Stomach cancer Neg Hx   . Heart disease Neg Hx   . Kidney disease Neg Hx   . Liver disease Neg Hx     No Known Allergies  Current Outpatient Prescriptions on File Prior to Visit  Medication Sig Dispense Refill  . amphetamine-dextroamphetamine (ADDERALL XR) 20 MG 24 hr capsule Take 20 mg by mouth every morning. Patient takes as needed.    . flurbiprofen (ANSAID) 50 MG tablet Take 1 tablet twice daily as needed, no more  than twice per week. 20 tablet 5  . NECON 1/50, 28, 1-50 MG-MCG tablet take 1 tablet by mouth once daily 28 tablet 11  . Probiotic Product (PROBIOTIC & ACIDOPHILUS EX ST) CAPS Take by mouth daily.    . prochlorperazine (COMPAZINE) 10 MG tablet Take 1 tablet (10 mg total) by mouth every 6 (six) hours as needed for nausea or vomiting (Take at onset of severe migraine). 20 tablet 0  . zolpidem (AMBIEN) 10 MG tablet Take 10 mg by mouth at bedtime as needed for sleep.     No current facility-administered medications on file prior to visit.     BP 105/70   Pulse 79   Temp 98.5 F (36.9 C) (Oral)   Ht 5' 5.8" (1.671 m)   Wt 216 lb 3.2 oz (98.1 kg)   LMP 05/22/2016   SpO2 100%   BMI 35.11 kg/m       Objective:   Physical Exam  General  Mental Status - Alert. General  Appearance - Well groomed. Not in acute distress.  Skin Rashes- No Rashes.  HEENT Head- Normal. Ear Auditory Canal - Left- moderate wax canal present(nurse lavage 99% wax scant present afterwards). Right - mild wax in canal rt side(scant amount).(cleared by currette)Tympanic Membrane- Left- Normal. Right- Normal. Eye Sclera/Conjunctiva- Left- Normal. Right- Normal. Nose & Sinuses Nasal Mucosa- Left-   Not Boggy and Congested. Right-   Not Boggy and  Congested.Bilateral  No maxillary and  No frontal sinus pressure. Mouth & Throat Lips: Upper Lip- Normal: no dryness, cracking, pallor, cyanosis, or vesicular eruption. Lower Lip-Normal: no dryness, cracking, pallor, cyanosis or vesicular eruption. Buccal Mucosa- Bilateral- No Aphthous ulcers. Oropharynx- No Discharge or Erythema. Tonsils: Characteristics- Bilateral- No Erythema or Congestion. Size/Enlargement- Bilateral- No enlargement. Discharge- bilateral-None.  Neck Neck- Supple. No Masses.   Chest and Lung Exam Auscultation: Breath Sounds:-Clear even and unlabored.  Cardiovascular Auscultation:Rythm- Regular, rate and rhythm. Murmurs & Other Heart Sounds:Ausculatation of the heart reveal- No Murmurs.  Lymphatic Head & Neck General Head & Neck Lymphatics: Bilateral: Description- No Localized lymphadenopathy.       Assessment & Plan:  You had minimal wax present in rt ear canal but that was cleared today with no complications. But no effect on pressure like sensation(under water sensation).  I want you to use both flonase and astelin spray as directed. You have had this a long time/since september. If symptoms not completely improved by 2 weeks then would refer to ENT.  Left side cerumen impaction. Moderate amount(majority of wax cleared out). Nurse lavaged successfully today. If you ever get blocked sensation in left ear  let us know we could recheck for wax and clear if wax present.  Follow up 2 weeks or as  needed.  Angelica Diaz, Percell Miller, PA-C

## 2016-08-04 ENCOUNTER — Other Ambulatory Visit: Payer: Self-pay | Admitting: Neurology

## 2016-08-08 ENCOUNTER — Encounter: Payer: Self-pay | Admitting: Neurology

## 2016-08-08 ENCOUNTER — Other Ambulatory Visit: Payer: Self-pay | Admitting: *Deleted

## 2016-08-08 MED ORDER — FLURBIPROFEN 50 MG PO TABS: ORAL_TABLET | ORAL | 0 refills | Status: DC

## 2016-08-27 ENCOUNTER — Encounter: Payer: Self-pay | Admitting: Medical

## 2016-08-27 ENCOUNTER — Telehealth: Payer: Self-pay | Admitting: Medical

## 2016-08-27 MED ORDER — NORETHINDRONE-MESTRANOL 1-50 MG-MCG PO TABS: 1.0000 | ORAL_TABLET | Freq: Every day | ORAL | 11 refills | Status: DC

## 2016-08-27 MED ORDER — NORETHINDRONE-ETH ESTRADIOL 1-35 MG-MCG PO TABS: 1.0000 | ORAL_TABLET | Freq: Every day | ORAL | 11 refills | Status: DC

## 2016-08-27 NOTE — Telephone Encounter (Signed)
Opened to review 

## 2016-08-27 NOTE — Telephone Encounter (Signed)
Refilled pt ocp today when I saw her refill request.

## 2016-08-27 NOTE — Telephone Encounter (Signed)
Pt current ocp was discontinued and no generic equivalent per pharmacist. Former rx'd by Einar Pheasant.Sent in ortho novum rx today. Notified pt.

## 2016-08-28 DIAGNOSIS — F3342 Major depressive disorder, recurrent, in full remission: Secondary | ICD-10-CM | POA: Diagnosis not present

## 2016-08-28 DIAGNOSIS — F900 Attention-deficit hyperactivity disorder, predominantly inattentive type: Secondary | ICD-10-CM | POA: Diagnosis not present

## 2016-08-29 ENCOUNTER — Encounter: Payer: Self-pay | Admitting: Medical

## 2016-08-29 ENCOUNTER — Telehealth: Payer: Self-pay | Admitting: Medical

## 2016-08-29 MED ORDER — NORETHINDRONE-ETH ESTRADIOL 1-35 MG-MCG PO TABS: 1.0000 | ORAL_TABLET | Freq: Every day | ORAL | 11 refills | Status: DC

## 2016-08-29 NOTE — Telephone Encounter (Signed)
Medication filled to [facility] as requested. Previous rx canceled at [facility].

## 2016-08-29 NOTE — Telephone Encounter (Addendum)
I sent in ortho novum to her pharmacy other day. I sent in to [facility] but she wants it sent to [facility]. Will you see office notes and send rx to correct pharmacy.  [facility] states [facility] in [place]. Will you call her and make sure we get right pharmacy.

## 2016-10-02 DIAGNOSIS — Z30014 Encounter for initial prescription of intrauterine contraceptive device: Secondary | ICD-10-CM | POA: Diagnosis not present

## 2016-10-02 DIAGNOSIS — Z01419 Encounter for gynecological examination (general) (routine) without abnormal findings: Secondary | ICD-10-CM | POA: Diagnosis not present

## 2016-10-02 DIAGNOSIS — Z30011 Encounter for initial prescription of contraceptive pills: Secondary | ICD-10-CM | POA: Diagnosis not present

## 2016-10-02 DIAGNOSIS — Z113 Encounter for screening for infections with a predominantly sexual mode of transmission: Secondary | ICD-10-CM | POA: Diagnosis not present

## 2016-10-08 DIAGNOSIS — Z3202 Encounter for pregnancy test, result negative: Secondary | ICD-10-CM | POA: Diagnosis not present

## 2016-10-08 DIAGNOSIS — Z30430 Encounter for insertion of intrauterine contraceptive device: Secondary | ICD-10-CM | POA: Diagnosis not present

## 2018-01-05 IMAGING — MR MR HEAD W/O CM
11 series · 42 of 48 positions shown · non-contrast
Comparison: MRI brain and MRA head 12/20/2012.

CLINICAL DATA: Chronic migraine headaches. Recent episode of the
most severe headaches she has ever had. Small meningioma.

EXAM:
MRI HEAD WITHOUT CONTRAST
MRA HEAD WITHOUT CONTRAST
TECHNIQUE: Multiplanar, multiecho pulse sequences of the brain and surrounding
structures were obtained without intravenous contrast. Angiographic
images of the head were obtained using MRA technique without
contrast.

[Series 2: t1_se_sag · sagittal · 5.0mm · 0.45mm/px · 2 of 21 slices shown]
[im 1/21]
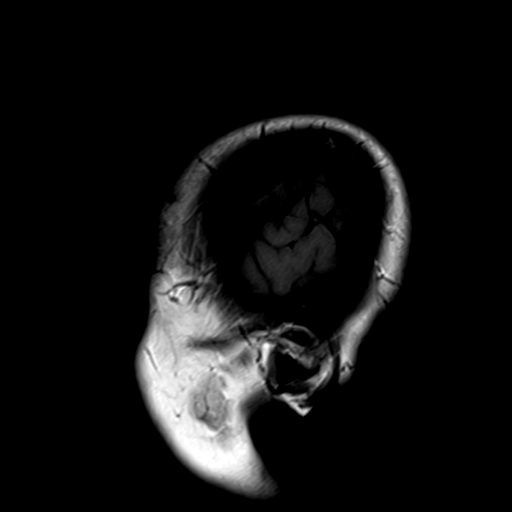
[im 21/21]
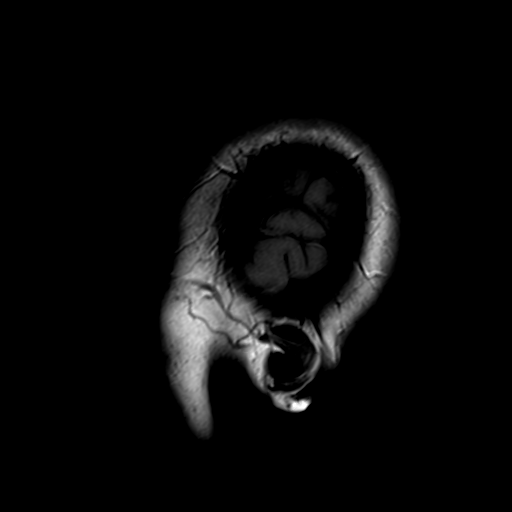

[Series 4: ep2d_diff_(id)_trace · axial · 3.0mm · 1.80mm/px · z∈[-51,+95]mm · 8 of 99 slices shown]
[im 1/99]
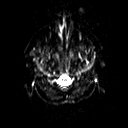
[im 15/99]
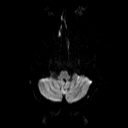
[im 29/99]
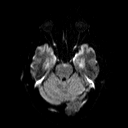
[im 43/99]
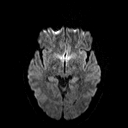
[im 57/99]
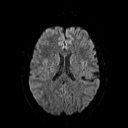
[im 71/99]
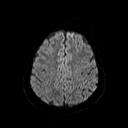
[im 85/99]
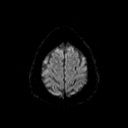
[im 99/99]
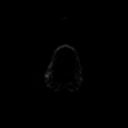

[Series 5: ep2d_diff_(id)_trace_adc · axial · 3.0mm · 1.80mm/px · z∈[-51,+95]mm · 4 of 50 slices shown]
[im 1/50]
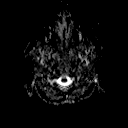
[im 17/50]
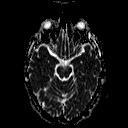
[im 33/50]
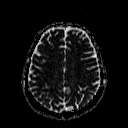
[im 50/50]
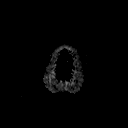

[Series 6: ep2d_diff_cor · coronal · 5.0mm · 1.77mm/px · 4 of 48 slices shown]
[im 1/48]
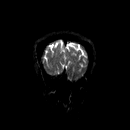
[im 16/48]
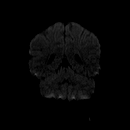
[im 32/48]
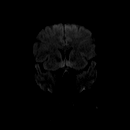
[im 48/48]
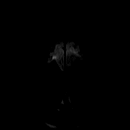

[Series 7: ep2d_diff_cor_adc · coronal · 5.0mm · 1.77mm/px · 2 of 24 slices shown]
[im 1/24]
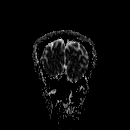
[im 24/24]
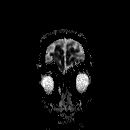

[Series 9: swi_images · axial · 2.0mm · 0.90mm/px · z∈[-56,+102]mm · 6 of 80 slices shown]
[im 1/80]
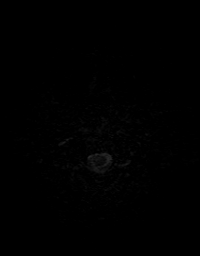
[im 16/80]
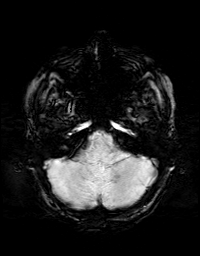
[im 32/80]
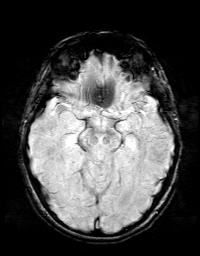
[im 48/80]
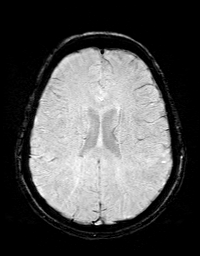
[im 64/80]
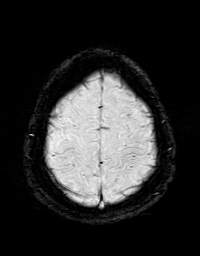
[im 80/80]
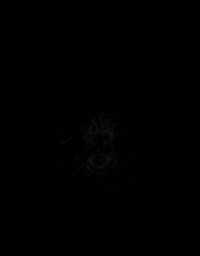

[Series 10: tof_3d_multi-slab new · axial · 0.7mm · 0.35mm/px · z∈[-39,+50]mm · 8 of 130 slices shown]
[im 1/130]
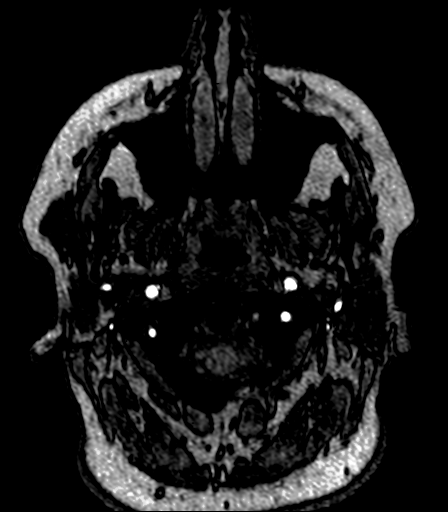
[im 15/130]
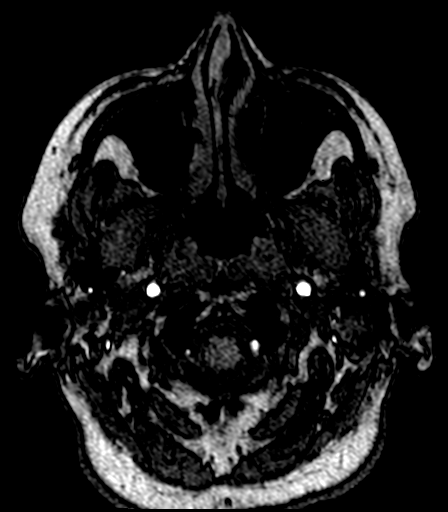
[im 44/130]
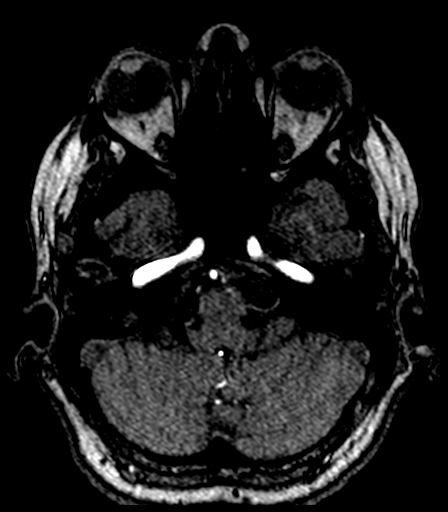
[im 58/130]
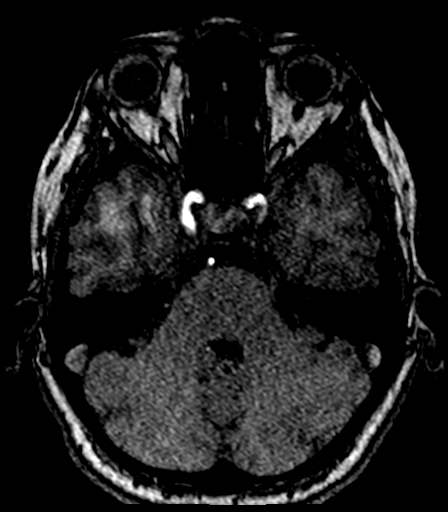
[im 72/130]
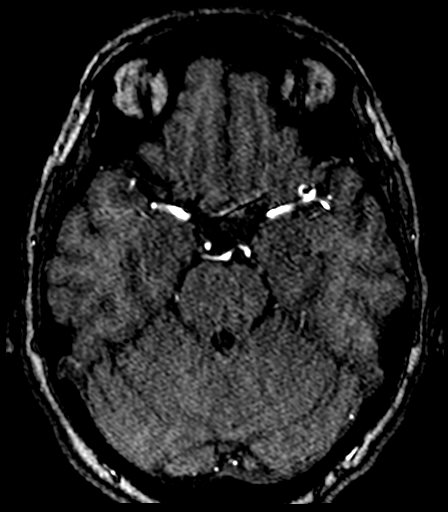
[im 87/130]
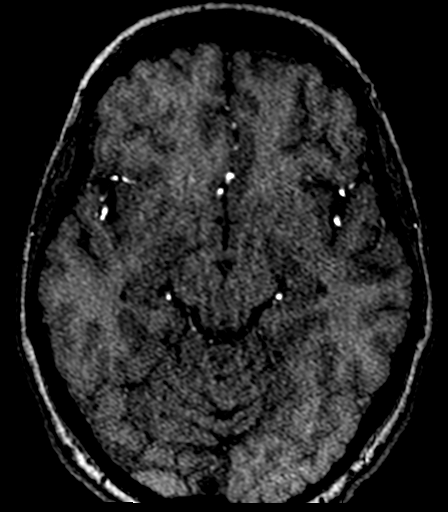
[im 115/130]
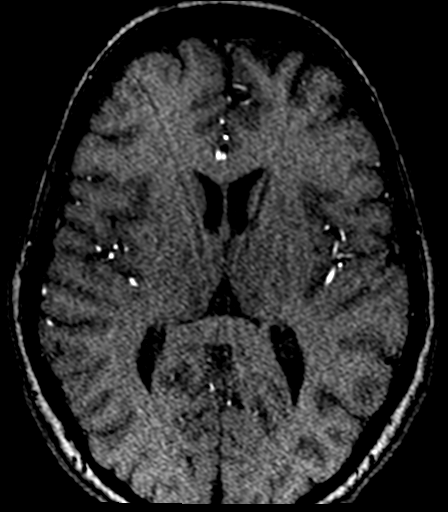
[im 130/130]
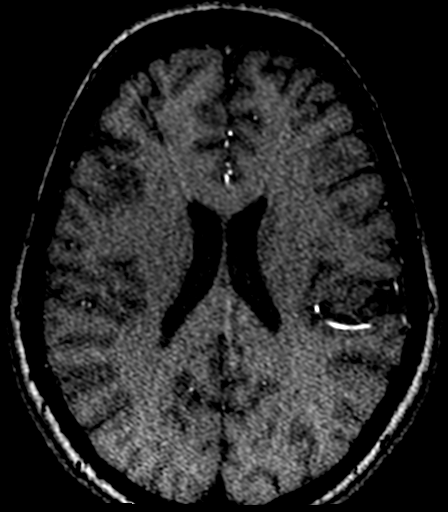

[Series 14: FLAIR · axial · 5.0mm · 0.45mm/px · z∈[-50,+94]mm · 2 of 24 slices shown]
[im 1/24]
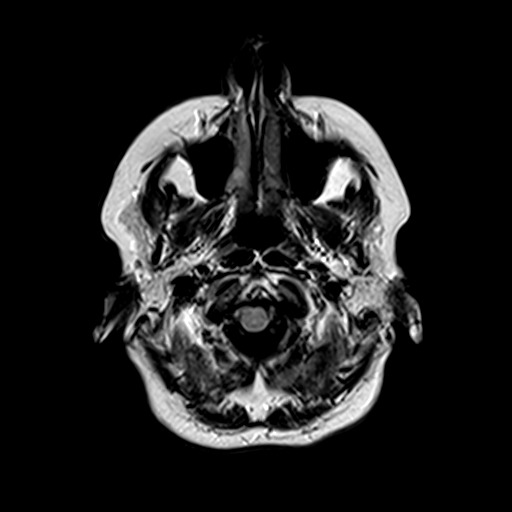
[im 24/24]
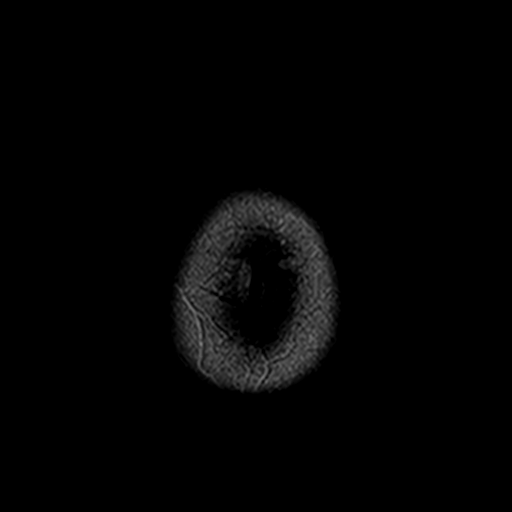

[Series 18: t2_tse_tra_512 · axial · 5.0mm · 0.60mm/px · z∈[-49,+95]mm · 2 of 24 slices shown]
[im 1/24]
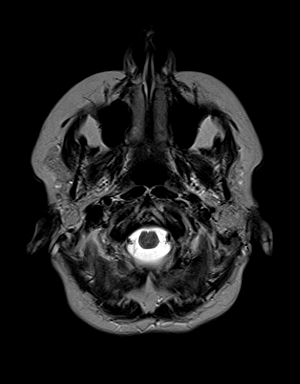
[im 24/24]
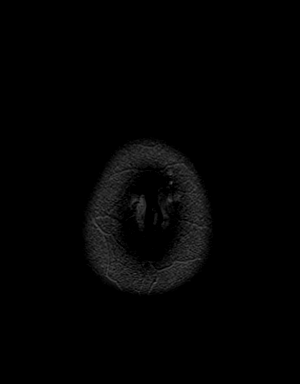

[Series 21: t1_mpr_tra · axial · 2.0mm · 0.45mm/px · z∈[-56,-26]mm · 2 of 80 slices shown]
[im 1/80]
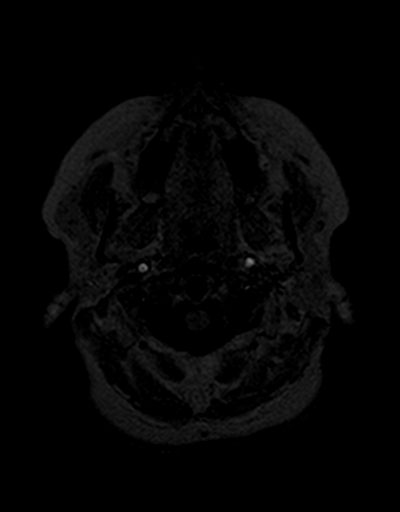
[im 16/80]
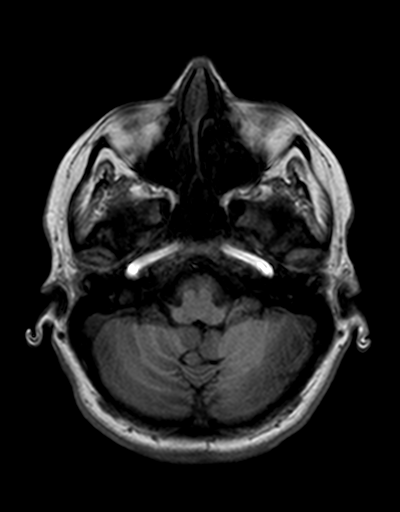

[Series 22: T2 · coronal · 5.0mm · 0.45mm/px · 2 of 26 slices shown]
[im 1/26]
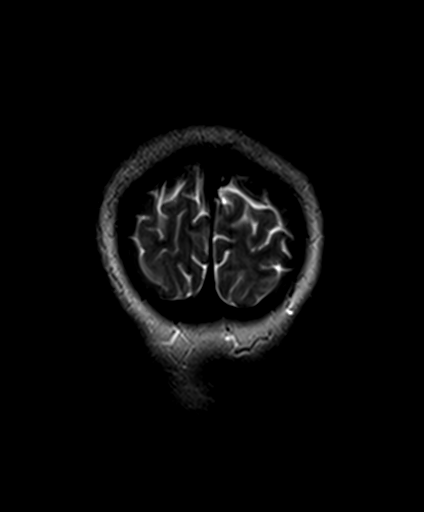
[im 26/26]
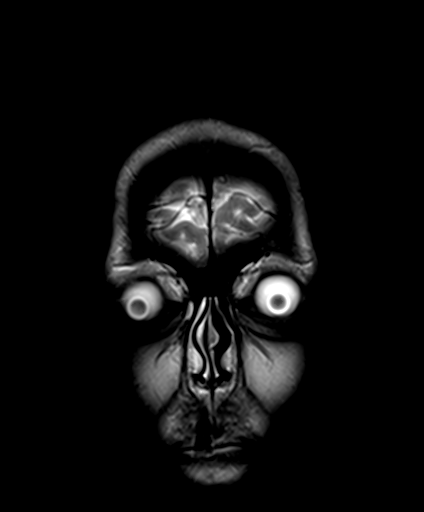

[42 of 48 positions shown; findings below may reference images not displayed]

FINDINGS: MRI HEAD FINDINGS

A 9 mm left frontal meningioma is stable. No other acute infarct,
hemorrhage, or mass lesion is present. Ventricles are of normal
size. No significant extra-axial fluid collection is present. There
is no significant white matter disease.

The internal auditory canals are within normal limits bilaterally.
The brainstem and cerebellum are normal. Flow is present in the
major intracranial arteries. The globes and orbits are intact.

MRA HEAD FINDINGS

Internal carotid arteries are within normal limits from the high
cervical segments through the ICA termini bilaterally. The A1 and M1
segments are normal. No definite anterior communicating artery is
present. The MCA bifurcations are intact. ACA and MCA branch vessels
are normal.

The right vertebral artery essentially terminates at the PICA
extending only a very small branch to the vertebrobasilar junction.
There is significant signal loss in the left vertebral artery of
above the PICA. The basilar artery is small, as before. A small P1
segment is present on the left. Fetal type posterior cerebral
arteries are present bilaterally. Narrowing is exaggerated by
artifact.
IMPRESSION: 1. Stable meningioma over the left frontal convexity. Maximal
dimension is 9 mm. Next item otherwise normal MRI of the brain. Next
item
2. Otherwise normal MRI of the brain.
3. Signal loss in the distal hypoplastic right vertebral artery in
the left vertebral artery above the PICA origin. The changed from
the prior study is likely artifactual.
4. Hypoplastic basilar artery with bilateral fetal type posterior
cerebral arteries.
5. Otherwise normal MRA circle of Willis without significant
proximal stenosis, aneurysm, or branch vessel occlusion.

## 2018-03-02 DIAGNOSIS — F411 Generalized anxiety disorder: Secondary | ICD-10-CM | POA: Diagnosis not present

## 2018-03-02 DIAGNOSIS — F3342 Major depressive disorder, recurrent, in full remission: Secondary | ICD-10-CM | POA: Diagnosis not present

## 2018-04-15 ENCOUNTER — Emergency Department (HOSPITAL_BASED_OUTPATIENT_CLINIC_OR_DEPARTMENT_OTHER)
Admission: EM | Admit: 2018-04-15 | Discharge: 2018-04-15 | Disposition: A | Discharge status: Home / Self Care | Payer: BLUE CROSS/BLUE SHIELD | Attending: Emergency Medicine | Admitting: Emergency Medicine

## 2018-04-15 ENCOUNTER — Emergency Department (HOSPITAL_BASED_OUTPATIENT_CLINIC_OR_DEPARTMENT_OTHER): Payer: BLUE CROSS/BLUE SHIELD

## 2018-04-15 ENCOUNTER — Other Ambulatory Visit: Payer: Self-pay

## 2018-04-15 ENCOUNTER — Encounter (HOSPITAL_BASED_OUTPATIENT_CLINIC_OR_DEPARTMENT_OTHER): Payer: Self-pay | Admitting: Emergency Medicine

## 2018-04-15 DIAGNOSIS — R0981 Nasal congestion: Secondary | ICD-10-CM | POA: Diagnosis not present

## 2018-04-15 DIAGNOSIS — R0602 Shortness of breath: Secondary | ICD-10-CM | POA: Diagnosis not present

## 2018-04-15 DIAGNOSIS — J45909 Unspecified asthma, uncomplicated: Secondary | ICD-10-CM | POA: Insufficient documentation

## 2018-04-15 DIAGNOSIS — J1100 Influenza due to unidentified influenza virus with unspecified type of pneumonia: Secondary | ICD-10-CM | POA: Insufficient documentation

## 2018-04-15 DIAGNOSIS — R079 Chest pain, unspecified: Secondary | ICD-10-CM | POA: Diagnosis not present

## 2018-04-15 DIAGNOSIS — Z87891 Personal history of nicotine dependence: Secondary | ICD-10-CM | POA: Diagnosis not present

## 2018-04-15 DIAGNOSIS — R05 Cough: Secondary | ICD-10-CM | POA: Diagnosis not present

## 2018-04-15 DIAGNOSIS — R509 Fever, unspecified: Secondary | ICD-10-CM | POA: Diagnosis present

## 2018-04-15 MED ORDER — SULFAMETHOXAZOLE-TRIMETHOPRIM 800-160 MG PO TABS: 1.0000 | ORAL_TABLET | Freq: Two times a day (BID) | ORAL | 0 refills | Status: AC

## 2018-04-15 MED ORDER — BENZONATATE 100 MG PO CAPS: 100.0000 mg | ORAL_CAPSULE | Freq: Three times a day (TID) | ORAL | 0 refills | Status: DC

## 2018-04-15 MED ORDER — CEFDINIR 300 MG PO CAPS: 300.0000 mg | ORAL_CAPSULE | Freq: Two times a day (BID) | ORAL | 0 refills | Status: AC

## 2018-04-15 NOTE — Discharge Instructions (Addendum)
You were evaluated in the Emergency Department and after careful evaluation, we did not find any emergent condition requiring admission or further testing in the hospital.  Your symptoms today seem to be due to influenza leading to pneumonia.  Please take the antibiotics as directed and use the Tessalon medication as needed for cough suppression.  Please return to the Emergency Department if you experience any worsening of your condition.  We encourage you to follow up with a primary care provider.  Thank you for allowing [place] to be a part of your care.

## 2018-04-15 NOTE — ED Provider Notes (Signed)
[facility] Emergency Department Provider Note MRN:  [id number]  Arrival date & time: 04/15/18     Chief Complaint   Influenza   History of Present Illness   Angelica Diaz is a 38 y.o. year-old female with a history of anxiety, depression presenting to the ED with chief complaint of influenza.  For 5 days patient has been experiencing fever, cough, nasal congestion, mild sore throat, dull frontal headache, body aches.  Was feeling better yesterday, but then symptoms recurred today.  Continued persistent cough.  Patient feels that she had the flu and now possibly pneumonia.  Denies chest pain or shortness of breath, no abdominal pain, no dysuria, no rash.  Symptoms are constant, no exacerbating relieving factors.  Review of Systems  A complete 10 system review of systems was obtained and all systems are negative except as noted in the HPI and PMH.   Patient's Health History    Past Medical History:  Diagnosis Date  . Anxiety   . Arthritis   . Asthma    hx of in teens   . Depression   . Headache(784.0)   . Migraine   . PONV (postoperative nausea and vomiting)    and irritability after oral surgeries   . Sleep disorder     Past Surgical History:  Procedure Laterality Date  . CESAREAN SECTION    . CHOLECYSTECTOMY N/A 05/27/2013   Procedure: LAPAROSCOPIC CHOLECYSTECTOMY WITH INTRAOPERATIVE CHOLANGIOGRAM;  Surgeon: Earnstine Regal, MD;  Location: [facility];  Service: General;  Laterality: N/A;  . oral surgeries     . WISDOM TOOTH EXTRACTION      Family History  Problem Relation Age of Onset  . Migraines Mother   . Diabetes Maternal Grandfather   . Migraines Son   . Colon cancer Neg Hx   . Throat cancer Neg Hx   . Pancreatic cancer Neg Hx   . Stomach cancer Neg Hx   . Heart disease Neg Hx   . Kidney disease Neg Hx   . Liver disease Neg Hx     Social History   Socioeconomic History  . Marital status: Single    Spouse name: Not on file  .  Number of children: 2  . Years of education: Not on file  . Highest education level: Not on file  Occupational History  . Occupation: Animator: [employer]   Social Needs  . Financial resource strain: Not on file  . Food insecurity:    Worry: Not on file    Inability: Not on file  . Transportation needs:    Medical: Not on file    Non-medical: Not on file  Tobacco Use  . Smoking status: Former Smoker    Packs/day: 0.50    Years: 18.00    Pack years: 9.00    Types: Cigarettes  . Smokeless tobacco: Never Used  . Tobacco comment: form given 08-15-13  Substance and Sexual Activity  . Alcohol use: No    Alcohol/week: 0.0 standard drinks    Comment: Occasionally 2-3 times per year   . Drug use: No  . Sexual activity: Not on file  Lifestyle  . Physical activity:    Days per week: Not on file    Minutes per session: Not on file  . Stress: Not on file  Relationships  . Social connections:    Talks on phone: Not on file    Gets together: Not on file  Attends religious service: Not on file    Active member of club or organization: Not on file    Attends meetings of clubs or organizations: Not on file    Relationship status: Not on file  . Intimate partner violence:    Fear of current or ex partner: Not on file    Emotionally abused: Not on file    Physically abused: Not on file    Forced sexual activity: Not on file  Other Topics Concern  . Not on file  Social History Narrative   Currently sells insurance.   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  She has two healthy children.     Physical Exam  Vital Signs and Nursing Notes reviewed Vitals:   04/15/18 2018  BP: 115/73  Pulse: 95  Resp: 20  Temp: (!) 100.7 F (38.2 C)  SpO2: 95%    CONSTITUTIONAL: Well-appearing, NAD NEURO:  Alert and oriented x 3, no focal deficits EYES:  eyes equal and reactive ENT/NECK:  no LAD, no JVD CARDIO: Regular rate, well-perfused, normal S1 and S2 PULM:  CTAB no wheezing  or rhonchi GI/GU:  normal bowel sounds, non-distended, non-tender MSK/SPINE:  No gross deformities, no edema SKIN:  no rash, atraumatic PSYCH:  Appropriate speech and behavior  Diagnostic and Interventional Summary    Labs Reviewed - No data to display  DG Chest 2 View  Final Result      Medications - No data to display   Procedures Critical Care  ED Course and Medical Decision Making  I have reviewed the triage vital signs and the nursing notes.  Pertinent labs & imaging results that were available during my care of the patient were reviewed by me and considered in my medical decision making (see below for details).  Consistent with influenza, question of secondary bacterial pneumonia, chest x-ray pending.  X-ray demonstrating multifocal pneumonia.  Given the clinical concern for influenza, there is concern for MRSA pneumonia.  However patient is well-appearing, normal vital signs, appropriate for outpatient management.  Strict return precautions.  After the discussed management above, the patient was determined to be safe for discharge.  The patient was in agreement with this plan and all questions regarding their care were answered.  ED return precautions were discussed and the patient will return to the ED with any significant worsening of condition.   Barth Kirks. Sedonia Small, [place] [email or web address]  Final Clinical Impressions(s) / ED Diagnoses     ICD-10-CM   1. Pneumonia due to influenza J11.00     ED Discharge Orders         Ordered    sulfamethoxazole-trimethoprim (BACTRIM DS,SEPTRA DS) 800-160 MG tablet  2 times daily     04/15/18 2219    cefdinir (OMNICEF) 300 MG capsule  2 times daily     04/15/18 2219    benzonatate (TESSALON) 100 MG capsule  Every 8 hours     04/15/18 2220             Maudie Flakes, MD 04/15/18 2222

## 2018-04-15 NOTE — ED Triage Notes (Signed)
Pt c/o 8/10 generalized body ache and flu symptoms for the past few days. Pt states she believes she has pneumonia.

## 2018-04-21 ENCOUNTER — Encounter: Payer: Self-pay | Admitting: Medical

## 2018-04-21 ENCOUNTER — Ambulatory Visit: Payer: BLUE CROSS/BLUE SHIELD | Admitting: Medical

## 2018-04-21 VITALS — BP 106/78 | HR 65 | Temp 98.3°F | Resp 16 | Ht 67.0 in | Wt 209.4 lb

## 2018-04-21 DIAGNOSIS — R062 Wheezing: Secondary | ICD-10-CM

## 2018-04-21 DIAGNOSIS — J189 Pneumonia, unspecified organism: Secondary | ICD-10-CM | POA: Diagnosis not present

## 2018-04-21 DIAGNOSIS — R5383 Other fatigue: Secondary | ICD-10-CM | POA: Diagnosis not present

## 2018-04-21 MED ORDER — PREDNISONE 10 MG PO TABS: ORAL_TABLET | ORAL | Status: DC

## 2018-04-21 MED ORDER — CEFTRIAXONE SODIUM 1 G IJ SOLR
1.0000 g | Freq: Once | INTRAMUSCULAR | Status: AC
  Administered 2018-04-21: 1 g via INTRAMUSCULAR

## 2018-04-21 MED ORDER — HYDROCODONE-HOMATROPINE 5-1.5 MG/5ML PO SYRP: 5.0000 mL | ORAL_SOLUTION | Freq: Four times a day (QID) | ORAL | 0 refills | Status: DC | PRN

## 2018-04-21 NOTE — Patient Instructions (Addendum)
Your recent been diagnosed with low and then days later subsequently diagnosed with bilateral pneumonia.  Some improvement since initial diagnoses but not feeling back to normal/baseline.  Particularly complaining of fatigue, cough and wheezing persisting.  Will get chest x-ray today. Unfortunately our lab is closed presently so placing future CBC and metabolic panel to be done tomorrow.  Please get scheduled to have that done tomorrow.  I we gave you Rocephin 1 g injection today. Based on chest x-ray and the lab results will decide on which antibiotic to give.  Will probably give antibiotic called levofloxacin.  Prescribed Hycodan for cough.  Rx advisement given. Continue with albuterol for wheezing.  Also prescribed taper dose of prednisone.  Printed prescription given.  Want to review chest x-ray and labs first before deciding whether to advise of prednisone.  With bilateral pneumonia diagnoses recently will need to proceed with caution.  If clinically worsening/failing outpatient treatment then would need to return to emergency department and under that scenario you might need to be admitted for IV antibiotics.  Follow-up in 7 days or as needed.

## 2018-04-21 NOTE — Progress Notes (Signed)
Subjective:    Patient ID: Diamond Nickel, female    DOB: 1981/02/10, 38 y.o.   MRN: [id number]  HPI  Pt in states about 11 days ago dx with flu. She had preceding myalgia and flu like syndrome with + flu test. Pt was seen late initially and was not given tamiflu.  Pt felt progresviley better but then early last week was having some persistent fever and cough. Thenhe felt like could not take full deep breath last week and was wheezing. Pt went to the ED and had evaluation. Chest xray showed below.  IMPRESSION: Suspected left upper and right lower lobe pneumonia.  Pt state she feels better than she did last week but still feels sick.  Pt was given bactrim ds and omnicef.(she is almost done with these). Only has one day left of those.  When wheezes she states albuterol does not help..   Review of Systems  Constitutional: Positive for fatigue. Negative for chills and fever.       Better than when dx with flu and last week but still fatigue.  Respiratory: Positive for cough and wheezing. Negative for chest tightness and shortness of breath.        Wheezing less than it was last week.  Cardiovascular: Negative for chest pain and palpitations.  Gastrointestinal: Negative for abdominal pain, constipation, diarrhea and vomiting.  Musculoskeletal: Negative for back pain.  Skin: Negative for rash.  Neurological: Negative for dizziness and numbness.  Hematological: Negative for adenopathy. Does not bruise/bleed easily.  Psychiatric/Behavioral: Negative for behavioral problems and confusion. The patient is not nervous/anxious.    Past Medical History:  Diagnosis Date  . Anxiety   . Arthritis   . Asthma    hx of in teens   . Depression   . Headache(784.0)   . Migraine   . PONV (postoperative nausea and vomiting)    and irritability after oral surgeries   . Sleep disorder      Social History   Socioeconomic History  . Marital status: Single    Spouse name: Not on file  .  Number of children: 2  . Years of education: Not on file  . Highest education level: Not on file  Occupational History  . Occupation: Animator: [employer]   Social Needs  . Financial resource strain: Not on file  . Food insecurity:    Worry: Not on file    Inability: Not on file  . Transportation needs:    Medical: Not on file    Non-medical: Not on file  Tobacco Use  . Smoking status: Former Smoker    Packs/day: 0.50    Years: 18.00    Pack years: 9.00    Types: Cigarettes  . Smokeless tobacco: Never Used  . Tobacco comment: form given 08-15-13  Substance and Sexual Activity  . Alcohol use: No    Alcohol/week: 0.0 standard drinks    Comment: Occasionally 2-3 times per year   . Drug use: No  . Sexual activity: Not on file  Lifestyle  . Physical activity:    Days per week: Not on file    Minutes per session: Not on file  . Stress: Not on file  Relationships  . Social connections:    Talks on phone: Not on file    Gets together: Not on file    Attends religious service: Not on file    Active member of club or organization: Not on file  Attends meetings of clubs or organizations: Not on file    Relationship status: Not on file  . Intimate partner violence:    Fear of current or ex partner: Not on file    Emotionally abused: Not on file    Physically abused: Not on file    Forced sexual activity: Not on file  Other Topics Concern  . Not on file  Social History Narrative   Currently sells insurance.   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  She has two healthy children.    Past Surgical History:  Procedure Laterality Date  . CESAREAN SECTION    . CHOLECYSTECTOMY N/A 05/27/2013   Procedure: LAPAROSCOPIC CHOLECYSTECTOMY WITH INTRAOPERATIVE CHOLANGIOGRAM;  Surgeon: Earnstine Regal, MD;  Location: [facility];  Service: General;  Laterality: N/A;  . oral surgeries     . WISDOM TOOTH EXTRACTION      Family History  Problem Relation Age of Onset  . Migraines  Mother   . Diabetes Maternal Grandfather   . Migraines Son   . Colon cancer Neg Hx   . Throat cancer Neg Hx   . Pancreatic cancer Neg Hx   . Stomach cancer Neg Hx   . Heart disease Neg Hx   . Kidney disease Neg Hx   . Liver disease Neg Hx     No Known Allergies  Current Outpatient Medications on File Prior to Visit  Medication Sig Dispense Refill  . amphetamine-dextroamphetamine (ADDERALL XR) 20 MG 24 hr capsule Take 20 mg by mouth every morning. Patient takes as needed.    Marland Kitchen azelastine (ASTELIN) 0.1 % nasal spray Place 2 sprays into both nostrils 2 (two) times daily. Use in each nostril as directed 30 mL 3  . benzonatate (TESSALON) 100 MG capsule Take 1 capsule (100 mg total) by mouth every 8 (eight) hours. 21 capsule 0  . cefdinir (OMNICEF) 300 MG capsule Take 1 capsule (300 mg total) by mouth 2 (two) times daily for 7 days. 14 capsule 0  . FLUoxetine (PROZAC) 20 MG/5ML solution Take 20 mg by mouth daily.    . flurbiprofen (ANSAID) 50 MG tablet Take 1 tablet twice daily as needed, no more than twice per week. 20 tablet 0  . fluticasone (FLONASE) 50 MCG/ACT nasal spray Place 2 sprays into both nostrils daily. 16 g 1  . norethindrone-ethinyl estradiol 1/35 (ORTHO-NOVUM, NORTREL,CYCLAFEM) tablet Take 1 tablet by mouth daily. 1 Package 11  . OLANZapine (ZYPREXA) 10 MG tablet Take 10 mg by mouth at bedtime.    . Probiotic Product (PROBIOTIC & ACIDOPHILUS EX ST) CAPS Take by mouth daily.    . prochlorperazine (COMPAZINE) 10 MG tablet Take 1 tablet (10 mg total) by mouth every 6 (six) hours as needed for nausea or vomiting (Take at onset of severe migraine). 20 tablet 0  . sulfamethoxazole-trimethoprim (BACTRIM DS,SEPTRA DS) 800-160 MG tablet Take 1 tablet by mouth 2 (two) times daily for 7 days. 14 tablet 0  . zolpidem (AMBIEN) 10 MG tablet Take 10 mg by mouth at bedtime as needed for sleep.     No current facility-administered medications on file prior to visit.     BP 106/78   Pulse  65   Temp 98.3 F (36.8 C) (Oral)   Resp 16   Ht 5\' 7"  (1.702 m)   Wt 209 lb 6.4 oz (95 kg)   LMP 04/07/2018   SpO2 100%   BMI 32.80 kg/m       Objective:   Physical Exam  General  Mental Status - Alert. General Appearance - Well groomed. Not in acute distress.  Skin Rashes- No Rashes.  HEENT Head- Normal. Ear Auditory Canal - Left- Normal. Right - Normal.Tympanic Membrane- Left- Normal. Right- Normal. Eye Sclera/Conjunctiva- Left- Normal. Right- Normal. Nose & Sinuses Nasal Mucosa- Left-  Boggy and Congested. Right-  Boggy and  Congested.Bilateral no maxillary and  No frontal sinus pressure. Mouth & Throat Lips: Upper Lip- Normal: no dryness, cracking, pallor, cyanosis, or vesicular eruption. Lower Lip-Normal: no dryness, cracking, pallor, cyanosis or vesicular eruption. Buccal Mucosa- Bilateral- No Aphthous ulcers. Oropharynx- No Discharge or Erythema. Tonsils: Characteristics- Bilateral- No Erythema or Congestion. Size/Enlargement- Bilateral- No enlargement. Discharge- bilateral-None.  Neck Neck- Supple. No Masses.   Chest and Lung Exam Auscultation: Breath Sounds:- even and unlabored.  Cardiovascular Auscultation:Rythm- Regular, rate and rhythm. Murmurs & Other Heart Sounds:Ausculatation of the heart reveal- No Murmurs.  Lymphatic Head & Neck General Head & Neck Lymphatics: Bilateral: Description- No Localized lymphadenopathy.       Assessment & Plan:  Your recent been diagnosed with low and then days later subsequently diagnosed with bilateral pneumonia.  Some improvement since initial diagnoses but not feeling back to normal/baseline.  Particularly complaining  of fatigue ,cough and wheezing persisting.  Will get chest x-ray today. Unfortunately our lab is closed presently so placing future CBC and metabolic panel to be done tomorrow.  Please get scheduled to have that done tomorrow.  I we gave you Rocephin 1 g injection today. Based on chest x-ray  and the lab results will decide on which antibiotic to give.  Will probably give antibiotic called levofloxacin.  Prescribed Hycodan for cough.  Rx advisement given. Continue with albuterol for wheezing.  Also prescribed taper dose of prednisone.  Printed prescription given.  Want to review chest x-ray and labs first before deciding whether to advise of prednisone.  With bilateral pneumonia diagnoses recently will need to proceed with caution.  If clinically worsening/failing outpatient treatment then would need to return to emergency department and under that scenario you might need to be admitted for IV antibiotics.  Follow-up in 7 days or as needed.  Mackie Pai, PA-C

## 2018-04-22 ENCOUNTER — Other Ambulatory Visit (INDEPENDENT_AMBULATORY_CARE_PROVIDER_SITE_OTHER): Payer: BLUE CROSS/BLUE SHIELD

## 2018-04-22 ENCOUNTER — Telehealth: Payer: Self-pay | Admitting: Medical

## 2018-04-22 ENCOUNTER — Ambulatory Visit (HOSPITAL_BASED_OUTPATIENT_CLINIC_OR_DEPARTMENT_OTHER)
Admission: RE | Admit: 2018-04-22 | Discharge: 2018-04-22 | Disposition: A | Discharge status: Home / Self Care | Payer: BLUE CROSS/BLUE SHIELD | Source: Ambulatory Visit | Attending: Medical | Admitting: Medical

## 2018-04-22 DIAGNOSIS — R5383 Other fatigue: Secondary | ICD-10-CM | POA: Diagnosis not present

## 2018-04-22 DIAGNOSIS — J189 Pneumonia, unspecified organism: Secondary | ICD-10-CM | POA: Insufficient documentation

## 2018-04-22 LAB — CBC WITH DIFFERENTIAL/PLATELET
Basophils Absolute: 0 10*3/uL (ref 0.0–0.1)
Basophils Relative: 0.8 % (ref 0.0–3.0)
EOS ABS: 0.1 10*3/uL (ref 0.0–0.7)
Eosinophils Relative: 1.1 % (ref 0.0–5.0)
HCT: 44.9 % (ref 36.0–46.0)
Hemoglobin: 15.2 g/dL — ABNORMAL HIGH (ref 12.0–15.0)
LYMPHS ABS: 2.4 10*3/uL (ref 0.7–4.0)
Lymphocytes Relative: 36.9 % (ref 12.0–46.0)
MCHC: 33.8 g/dL (ref 30.0–36.0)
MCV: 92.8 fl (ref 78.0–100.0)
MONO ABS: 0.4 10*3/uL (ref 0.1–1.0)
Monocytes Relative: 6 % (ref 3.0–12.0)
NEUTROS PCT: 55.2 % (ref 43.0–77.0)
Neutro Abs: 3.6 10*3/uL (ref 1.4–7.7)
PLATELETS: 281 10*3/uL (ref 150.0–400.0)
RBC: 4.84 Mil/uL (ref 3.87–5.11)
RDW: 13 % (ref 11.5–15.5)
WBC: 6.5 10*3/uL (ref 4.0–10.5)

## 2018-04-22 LAB — COMPREHENSIVE METABOLIC PANEL
ALK PHOS: 62 U/L (ref 39–117)
ALT: 21 U/L (ref 0–35)
AST: 14 U/L (ref 0–37)
Albumin: 4 g/dL (ref 3.5–5.2)
BUN: 23 mg/dL (ref 6–23)
CO2: 25 mEq/L (ref 19–32)
Calcium: 9.6 mg/dL (ref 8.4–10.5)
Chloride: 103 mEq/L (ref 96–112)
Creatinine, Ser: 0.93 mg/dL (ref 0.40–1.20)
GFR: 66.96 mL/min (ref >60.00)
Glucose, Bld: 96 mg/dL (ref 70–99)
Potassium: 4.9 mEq/L (ref 3.5–5.1)
Sodium: 136 mEq/L (ref 135–145)
Total Bilirubin: 0.3 mg/dL (ref 0.2–1.2)
Total Protein: 6.5 g/dL (ref 6.0–8.3)

## 2018-04-22 MED ORDER — DOXYCYCLINE HYCLATE 100 MG PO TABS: 100.0000 mg | ORAL_TABLET | Freq: Two times a day (BID) | ORAL | 0 refills | Status: DC

## 2018-04-22 NOTE — Telephone Encounter (Signed)
Rx doxycycline sent to pt pharmacy. 

## 2018-04-22 NOTE — Telephone Encounter (Signed)
Opened to review 

## 2018-06-07 ENCOUNTER — Ambulatory Visit (HOSPITAL_BASED_OUTPATIENT_CLINIC_OR_DEPARTMENT_OTHER)
Admission: RE | Admit: 2018-06-07 | Discharge: 2018-06-07 | Disposition: A | Discharge status: Home / Self Care | Payer: BLUE CROSS/BLUE SHIELD | Source: Ambulatory Visit | Attending: Medical | Admitting: Medical

## 2018-06-07 ENCOUNTER — Ambulatory Visit: Payer: BLUE CROSS/BLUE SHIELD | Admitting: Medical

## 2018-06-07 ENCOUNTER — Other Ambulatory Visit: Payer: Self-pay

## 2018-06-07 ENCOUNTER — Encounter: Payer: Self-pay | Admitting: Medical

## 2018-06-07 VITALS — BP 118/90 | HR 81 | Temp 99.0°F | Resp 18 | Ht 67.0 in | Wt 213.8 lb

## 2018-06-07 DIAGNOSIS — R05 Cough: Secondary | ICD-10-CM

## 2018-06-07 DIAGNOSIS — R6889 Other general symptoms and signs: Secondary | ICD-10-CM | POA: Diagnosis not present

## 2018-06-07 DIAGNOSIS — M791 Myalgia, unspecified site: Secondary | ICD-10-CM | POA: Diagnosis not present

## 2018-06-07 DIAGNOSIS — J029 Acute pharyngitis, unspecified: Secondary | ICD-10-CM | POA: Diagnosis not present

## 2018-06-07 DIAGNOSIS — R059 Cough, unspecified: Secondary | ICD-10-CM

## 2018-06-07 DIAGNOSIS — J40 Bronchitis, not specified as acute or chronic: Secondary | ICD-10-CM

## 2018-06-07 LAB — POCT RAPID STREP A (OFFICE): Rapid Strep A Screen: NEGATIVE

## 2018-06-07 LAB — POC INFLUENZA A&B (BINAX/QUICKVUE)
Influenza A, POC: NEGATIVE
Influenza B, POC: NEGATIVE

## 2018-06-07 MED ORDER — DOXYCYCLINE HYCLATE 100 MG PO TABS: ORAL_TABLET | ORAL | 0 refills | Status: DC

## 2018-06-07 MED ORDER — BENZONATATE 100 MG PO CAPS: 100.0000 mg | ORAL_CAPSULE | Freq: Three times a day (TID) | ORAL | 0 refills | Status: DC | PRN

## 2018-06-07 MED ORDER — PREDNISONE 10 MG PO TABS: ORAL_TABLET | ORAL | Status: DC

## 2018-06-07 NOTE — Progress Notes (Signed)
Subjective:    Patient ID: Angelica Diaz, female    DOB: 12/18/1980, 38 y.o.   MRN: [id number]  HPI  Pt in states yesterday st. Mild pain on swallowing. She has some bodyaches. Pt states nose is running and has dry cough.   She denies any chest pain but states she had bronchitis many time and inside chest wall feels like about to get bronchitis again. She stresses that is very susceptible to both bronchitis and pneumonias.   Pt does not in January had flu like illness followed by bilateral pneumonia.   Pt has albuterol at home. But has not had to use recently.  Cough is keeping her up at night.   Review of Systems  Constitutional: Negative for chills, fatigue and fever.  HENT: Positive for sore throat. Negative for congestion, ear discharge, sinus pressure and sinus pain.   Respiratory: Positive for cough. Negative for chest tightness, shortness of breath and wheezing.   Cardiovascular: Negative for chest pain and palpitations.  Gastrointestinal: Negative for abdominal pain, constipation and diarrhea.  Musculoskeletal: Positive for myalgias. Negative for back pain and neck pain.  Skin: Negative for rash.  Neurological: Negative for dizziness, seizures and weakness.  Hematological: Negative for adenopathy. Does not bruise/bleed easily.  Psychiatric/Behavioral: Negative for behavioral problems.    Past Medical History:  Diagnosis Date  . Anxiety   . Arthritis   . Asthma    hx of in teens   . Depression   . Headache(784.0)   . Migraine   . PONV (postoperative nausea and vomiting)    and irritability after oral surgeries   . Sleep disorder      Social History   Socioeconomic History  . Marital status: Single    Spouse name: Not on file  . Number of children: 2  . Years of education: Not on file  . Highest education level: Not on file  Occupational History  . Occupation: Animator: [employer]   Social Needs  . Financial resource strain:  Not on file  . Food insecurity:    Worry: Not on file    Inability: Not on file  . Transportation needs:    Medical: Not on file    Non-medical: Not on file  Tobacco Use  . Smoking status: Former Smoker    Packs/day: 0.50    Years: 18.00    Pack years: 9.00    Types: Cigarettes  . Smokeless tobacco: Never Used  . Tobacco comment: form given 08-15-13  Substance and Sexual Activity  . Alcohol use: No    Alcohol/week: 0.0 standard drinks    Comment: Occasionally 2-3 times per year   . Drug use: No  . Sexual activity: Not on file  Lifestyle  . Physical activity:    Days per week: Not on file    Minutes per session: Not on file  . Stress: Not on file  Relationships  . Social connections:    Talks on phone: Not on file    Gets together: Not on file    Attends religious service: Not on file    Active member of club or organization: Not on file    Attends meetings of clubs or organizations: Not on file    Relationship status: Not on file  . Intimate partner violence:    Fear of current or ex partner: Not on file    Emotionally abused: Not on file    Physically abused: Not on file  Forced sexual activity: Not on file  Other Topics Concern  . Not on file  Social History Narrative   Currently sells insurance.   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  She has two healthy children.    Past Surgical History:  Procedure Laterality Date  . CESAREAN SECTION    . CHOLECYSTECTOMY N/A 05/27/2013   Procedure: LAPAROSCOPIC CHOLECYSTECTOMY WITH INTRAOPERATIVE CHOLANGIOGRAM;  Surgeon: Earnstine Regal, MD;  Location: [facility];  Service: General;  Laterality: N/A;  . oral surgeries     . WISDOM TOOTH EXTRACTION      Family History  Problem Relation Age of Onset  . Migraines Mother   . Diabetes Maternal Grandfather   . Migraines Son   . Colon cancer Neg Hx   . Throat cancer Neg Hx   . Pancreatic cancer Neg Hx   . Stomach cancer Neg Hx   . Heart disease Neg Hx   . Kidney disease Neg Hx   . Liver  disease Neg Hx     No Known Allergies  Current Outpatient Medications on File Prior to Visit  Medication Sig Dispense Refill  . FLUoxetine (PROZAC) 20 MG/5ML solution Take 20 mg by mouth daily.    Marland Kitchen zolpidem (AMBIEN) 10 MG tablet Take 10 mg by mouth at bedtime as needed for sleep.    Marland Kitchen amphetamine-dextroamphetamine (ADDERALL XR) 20 MG 24 hr capsule Take 20 mg by mouth every morning. Patient takes as needed.    Marland Kitchen azelastine (ASTELIN) 0.1 % nasal spray Place 2 sprays into both nostrils 2 (two) times daily. Use in each nostril as directed (Patient not taking: Reported on 06/07/2018) 30 mL 3  . flurbiprofen (ANSAID) 50 MG tablet Take 1 tablet twice daily as needed, no more than twice per week. (Patient not taking: Reported on 06/07/2018) 20 tablet 0  . fluticasone (FLONASE) 50 MCG/ACT nasal spray Place 2 sprays into both nostrils daily. (Patient not taking: Reported on 06/07/2018) 16 g 1  . norethindrone-ethinyl estradiol 1/35 (ORTHO-NOVUM, NORTREL,CYCLAFEM) tablet Take 1 tablet by mouth daily. (Patient not taking: Reported on 06/07/2018) 1 Package 11  . OLANZapine (ZYPREXA) 10 MG tablet Take 10 mg by mouth at bedtime.    . predniSONE (DELTASONE) 10 MG tablet 6 TAB PO DAY 1 5 TAB PO DAY 2 4 TAB PO DAY 3 3 TAB PO DAY 4 2 TAB PO DAY 5 1 TAB PO DAY 6 (Patient not taking: Reported on 06/07/2018) 21 tablet o  . Probiotic Product (PROBIOTIC & ACIDOPHILUS EX ST) CAPS Take by mouth daily.    . prochlorperazine (COMPAZINE) 10 MG tablet Take 1 tablet (10 mg total) by mouth every 6 (six) hours as needed for nausea or vomiting (Take at onset of severe migraine). (Patient not taking: Reported on 06/07/2018) 20 tablet 0   No current facility-administered medications on file prior to visit.     BP 118/90 (BP Location: Left Arm, Patient Position: Sitting, Cuff Size: Large)   Pulse 81   Temp 99 F (37.2 C) (Oral)   Resp 18   Ht 5\' 7"  (1.702 m)   Wt 213 lb 12.8 oz (97 kg)   SpO2 98%   BMI 33.49 kg/m         Objective:   Physical Exam  General  Mental Status - Alert. General Appearance - Well groomed. Not in acute distress.  Skin Rashes- No Rashes.  HEENT Head- Normal. Ear Auditory Canal - Left- Normal. Right - Normal.Tympanic Membrane- Left- Normal. Right- Normal. Eye Sclera/Conjunctiva- Left- Normal.  Right- Normal. Nose & Sinuses Nasal Mucosa- Left-  Boggy and Congested. Right-  Boggy and  Congested.Bilateral no maxillary and no  frontal sinus pressure. Mouth & Throat Lips: Upper Lip- Normal: no dryness, cracking, pallor, cyanosis, or vesicular eruption. Lower Lip-Normal: no dryness, cracking, pallor, cyanosis or vesicular eruption. Buccal Mucosa- Bilateral- No Aphthous ulcers. Oropharynx- No Discharge or Erythema. Tonsils: Characteristics- Bilateral- No Erythema or Congestion. Size/Enlargement- Bilateral- No enlargement. Discharge- bilateral-None.  Neck Neck- Supple. No Masses.   Chest and Lung Exam Auscultation: Breath Sounds:-Clear even and unlabored.  Cardiovascular Auscultation:Rythm- Regular, rate and rhythm. Murmurs & Other Heart Sounds:Ausculatation of the heart reveal- No Murmurs.  Lymphatic Head & Neck General Head & Neck Lymphatics: Bilateral: Description- No Localized lymphadenopathy.       Assessment & Plan:  Your rapid strep test and flu test were both negative.  I would think both tests are likely accurate based on your exam and recent prescription history.  More concerned presently for your history of bronchitis and history of pneumonia.   Will prescribe benzonatate for cough and prescribe doxycycline antibiotic.  Please get chest x-ray today.  You do have albuterol inhaler available to use if you start to wheeze.  Will go ahead and give you a print prescription of a 6-day taper dose of prednisone if you end up having to use albuterol repetitively as you described pattern like this in the past.  Follow-up in 7 to 10 days or as needed.  Mackie Pai, PA-C

## 2018-06-07 NOTE — Patient Instructions (Addendum)
Your rapid strep test and flu test were both negative.  I would think both tests are likely accurate based on your exam and recent prescription history.  More concerned presently for your history of bronchitis and history of pneumonia.   Will prescribe benzonatate for cough and prescribe doxycycline antibiotic.  Please get chest x-ray today.  You do have albuterol inhaler available to use if you start to wheeze.  Will go ahead and give you a print prescription of a 6-day taper dose of prednisone if you end up having to use albuterol repetitively as you described pattern like this in the past.  Follow-up in 7 to 10 days or as needed.

## 2018-06-08 ENCOUNTER — Telehealth: Payer: Self-pay | Admitting: Medical

## 2018-06-08 MED ORDER — DOXYCYCLINE HYCLATE 100 MG PO TABS: 100.0000 mg | ORAL_TABLET | Freq: Two times a day (BID) | ORAL | 0 refills | Status: DC

## 2018-06-08 MED ORDER — HYDROCODONE-HOMATROPINE 5-1.5 MG/5ML PO SYRP: 5.0000 mL | ORAL_SOLUTION | Freq: Four times a day (QID) | ORAL | 0 refills | Status: DC | PRN

## 2018-06-08 NOTE — Telephone Encounter (Signed)
Copied from [place] [id number]. Topic: Quick Communication - See Telephone Encounter >> Jun 08, 2018  9:36 AM Valla Leaver wrote: CRM for notification. See Telephone encounter for: 06/08/18. Patient wants to know if PA Saguier can send the tessalon syrup instead of the pills. She has already picked up the tessalon pills and began taking them. She also say the pharmacy wouldn't fill doxycycline (VIBRA-TABS) 100 MG tablet because there were so instructions on how to take it. Please contact pharmacy to correct this and call patient to notify if the syrup will be called in.

## 2018-06-08 NOTE — Telephone Encounter (Signed)
I sent in benzonatate but advised if this does not work well then would send in hycodan which has hydrocodone in it. Not to use both at same time. Hycodan is stronger cough medicine.  Not sure why doxycycline did not have instruction. When order is placed instruction pre populates. Any how I resent the prescription with instruction to take twice daily.Marland Kitchen

## 2018-06-10 DIAGNOSIS — F411 Generalized anxiety disorder: Secondary | ICD-10-CM | POA: Diagnosis not present

## 2018-06-10 DIAGNOSIS — F3342 Major depressive disorder, recurrent, in full remission: Secondary | ICD-10-CM | POA: Diagnosis not present

## 2018-06-18 ENCOUNTER — Encounter: Payer: Self-pay | Admitting: Medical

## 2018-06-19 ENCOUNTER — Telehealth: Payer: Self-pay | Admitting: Medical

## 2018-06-19 NOTE — Telephone Encounter (Signed)
Note printed so she can work from home. Will you check printer. For her and 2 other patients as well.

## 2018-06-21 NOTE — Telephone Encounter (Signed)
Note printed and up front  ready for pick up

## 2018-06-28 ENCOUNTER — Encounter: Payer: Self-pay | Admitting: Medical

## 2018-06-29 ENCOUNTER — Telehealth: Payer: Self-pay | Admitting: Medical

## 2018-06-29 NOTE — Telephone Encounter (Signed)
I talked to pt about staying safe during viral pandemic. Gave advise on social distancing and situation of boyfriend who continues to work selling cars.

## 2018-07-19 ENCOUNTER — Ambulatory Visit: Payer: BLUE CROSS/BLUE SHIELD | Admitting: Neurology

## 2018-07-22 ENCOUNTER — Encounter

## 2018-07-22 ENCOUNTER — Telehealth: Payer: BLUE CROSS/BLUE SHIELD | Admitting: Neurology

## 2018-08-27 ENCOUNTER — Telehealth: Payer: Self-pay | Admitting: Neurology

## 2018-08-27 NOTE — Telephone Encounter (Signed)
Patient called regarding needing her annual MRI scheduled. Please Call. Thanks

## 2018-08-30 NOTE — Telephone Encounter (Signed)
Dis regard message. Thanks

## 2018-09-07 ENCOUNTER — Encounter: Payer: BLUE CROSS/BLUE SHIELD | Admitting: Neurology

## 2018-09-16 ENCOUNTER — Encounter: Payer: Self-pay | Admitting: Neurology

## 2018-09-17 ENCOUNTER — Other Ambulatory Visit: Payer: Self-pay

## 2018-09-17 ENCOUNTER — Telehealth (INDEPENDENT_AMBULATORY_CARE_PROVIDER_SITE_OTHER): Payer: Self-pay | Admitting: Neurology

## 2018-09-17 VITALS — Ht 67.5 in | Wt 208.0 lb

## 2018-09-17 DIAGNOSIS — G43009 Migraine without aura, not intractable, without status migrainosus: Secondary | ICD-10-CM

## 2018-09-17 DIAGNOSIS — R519 Headache, unspecified: Secondary | ICD-10-CM

## 2018-09-17 DIAGNOSIS — D329 Benign neoplasm of meninges, unspecified: Secondary | ICD-10-CM

## 2018-09-17 DIAGNOSIS — R51 Headache: Secondary | ICD-10-CM

## 2018-09-17 MED ORDER — TOPIRAMATE 25 MG PO TABS: ORAL_TABLET | ORAL | 3 refills | Status: DC

## 2018-09-17 NOTE — Progress Notes (Signed)
New Patient Virtual Visit via Video Note The purpose of this virtual visit is to provide medical care while limiting exposure to the novel coronavirus.    Consent was obtained for video visit:  Yes.   Answered questions that patient had about telehealth interaction:  Yes.   I discussed the limitations, risks, security and privacy concerns of performing an evaluation and management service by telemedicine. I also discussed with the patient that there may be a patient responsible charge related to this service. The patient expressed understanding and agreed to proceed.  Pt location: Home Physician Location: office Name of referring provider:  Saguier, Percell Miller, PA-C I connected with Angelica Diaz at patients initiation/request on 09/17/2018 at  9:30 AM EDT by video enabled telemedicine application and verified that I am speaking with the correct person using two identifiers. Pt MRN:  [id number] Pt DOB:  [birth year] Video Participants:  Angelica Diaz    History of Present Illness: Angelica Diaz is a 38 y.o. right-handed female with anxiety, and treatment resistant bipolar depression presenting for evaluation of chronic daily headaches.  She was previously evaluated by me for the same complaints in 2014 - 2017 and was tried on a number of medications including topiramate, nortriptyline, and propranolol.  Headaches were well controlled on topiramate 100 mg daily, however due to severe paresthesias she was switched to Trokendi 50 mg.  Her paresthesias and headaches were much better on Trokendi 50 mg, however she felt the tablet was too big and unable to swallow it, therefore it was stopped.  At her last visit on 09/07/2015, she was started on zonisamide 25 mg, however she reports not taking this medication.  Over the past 3 years, she has not been on a daily preventative medication for headaches.  She admits to treating her headaches with excessive ibuprofen, taking up to 16 tablets/week.    Headaches  are unchanged and continue to be a throbbing, holocephalic, and occur daily.  She endorses photophobia, phonophobia, nausea, vomiting, and lack of appetite.  There is no associated numbness, weakness, or vision changes.  She also complains of easily forgetting tasks and is having greater difficulty keeping a job because of this.  She is able to maintain her IADLs and ADLs.  She sees psychiatry for treatment resistant depression and will be started on a new medication.  Overall mood is low.  She is also requesting updated MRI brain to follow-up on left frontal meningioma.  She had an MRI performed in [place], but I do not have this to review.  MRI from 2017 showed stable 9 mm left frontal meningioma.  Out-side paper records, electronic medical record, and images have been reviewed where available and summarized as:  MRI/A brain 08/19/2015: 1. Stable meningioma over the left frontal convexity. Maximal dimension is 9 mm. Next item otherwise normal MRI of the brain. Next item 2. Otherwise normal MRI of the brain. 3. Signal loss in the distal hypoplastic right vertebral artery in the left vertebral artery above the PICA origin. The changed from the prior study is likely artifactual. 4. 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 5. Otherwise normal MRA circle of Willis without significant proximal stenosis, aneurysm, or branch vessel occlusion.  Lab Results  Component Value Date   PPIRJJOA41 660 07/28/2014   Lab Results  Component Value Date   TSH 2.16 01/24/2015   Lab Results  Component Value Date   ESRSEDRATE 9 02/01/2013    Past Medical History:  Diagnosis Date  . Anxiety   . Arthritis   . Asthma    hx of in teens   . Depression   . Headache(784.0)   . Migraine   . PONV (postoperative nausea and vomiting)    and irritability after oral surgeries   . Sleep disorder     Past Surgical History:  Procedure Laterality Date  . CESAREAN SECTION     . CHOLECYSTECTOMY N/A 05/27/2013   Procedure: LAPAROSCOPIC CHOLECYSTECTOMY WITH INTRAOPERATIVE CHOLANGIOGRAM;  Surgeon: Earnstine Regal, MD;  Location: [facility];  Service: General;  Laterality: N/A;  . fallopian tube removal Bilateral 2018  . oral surgeries     . WISDOM TOOTH EXTRACTION       Medications:  Outpatient Encounter Medications as of 09/17/2018  Medication Sig  . ALPRAZolam (XANAX) 1 MG tablet Take 1 mg by mouth daily.  Marland Kitchen amphetamine-dextroamphetamine (ADDERALL XR) 20 MG 24 hr capsule Take 20 mg by mouth every morning. Patient takes as needed.  Marland Kitchen azelastine (ASTELIN) 0.1 % nasal spray Place 2 sprays into both nostrils 2 (two) times daily. Use in each nostril as directed  . benzonatate (TESSALON) 100 MG capsule Take 1 capsule (100 mg total) by mouth 3 (three) times daily as needed for cough.  . doxycycline (VIBRA-TABS) 100 MG tablet Take 1 tablet (100 mg total) by mouth 2 (two) times daily. Can give caps or generic  . FLUoxetine HCl (PROZAC PO) Take 80 mg by mouth daily.  . flurbiprofen (ANSAID) 50 MG tablet Take 1 tablet twice daily as needed, no more than twice per week.  . fluticasone (FLONASE) 50 MCG/ACT nasal spray Place 2 sprays into both nostrils daily.  Marland Kitchen HYDROcodone-homatropine (HYCODAN) 5-1.5 MG/5ML syrup Take 5 mLs by mouth every 6 (six) hours as needed.  . norethindrone-ethinyl estradiol 1/35 (ORTHO-NOVUM, NORTREL,CYCLAFEM) tablet Take 1 tablet by mouth daily.  Marland Kitchen OLANZapine (ZYPREXA) 10 MG tablet Take 10 mg by mouth at bedtime.  . predniSONE (DELTASONE) 10 MG tablet 6 TAB PO DAY 1 5 TAB PO DAY 2 4 TAB PO DAY 3 3 TAB PO DAY 4 2 TAB PO DAY 5 1 TAB PO DAY 6  . Probiotic Product (PROBIOTIC & ACIDOPHILUS EX ST) CAPS Take by mouth daily.  . prochlorperazine (COMPAZINE) 10 MG tablet Take 1 tablet (10 mg total) by mouth every 6 (six) hours as needed for nausea or vomiting (Take at onset of severe migraine).  . zolpidem (AMBIEN) 10 MG tablet Take 10 mg by mouth at bedtime as  needed for sleep.  . [DISCONTINUED] FLUoxetine (PROZAC) 20 MG/5ML solution Take 20 mg by mouth daily.   No facility-administered encounter medications on file as of 09/17/2018.     Allergies: No Known Allergies  Family History: Family History  Problem Relation Age of Onset  . Migraines Mother   . Diabetes Maternal Grandfather   . Migraines Son   . Colon cancer Neg Hx   . Throat cancer Neg Hx   . Pancreatic cancer Neg Hx   . Stomach cancer Neg Hx   . Heart disease Neg Hx   . Kidney disease Neg Hx   . Liver disease Neg Hx     Social History: Social History   Tobacco Use  . Smoking status: Current Every Day Smoker    Packs/day: 0.50    Years: 18.00    Pack years: 9.00    Types: Cigarettes  . Smokeless tobacco: Never Used  . Tobacco comment: form given 08-15-13  Substance Use  Topics  . Alcohol use: No    Alcohol/week: 0.0 standard drinks    Comment: Occasionally 2-3 times per year   . Drug use: No   Social History   Social History Narrative   Unemployed.      Applying for disability.      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  She has two healthy children.      Lives in single story home.      Right handed.      Highest level of edu- highschool       Review of Systems:  CONSTITUTIONAL: No fevers, chills, night sweats, or weight loss.   EYES: No visual changes or eye pain ENT: No hearing changes.  No history of nose bleeds.   RESPIRATORY: No cough, wheezing and shortness of breath.   CARDIOVASCULAR: Negative for chest pain, and palpitations.   GI: Negative for abdominal discomfort, blood in stools or black stools.  No recent change in bowel habits.   GU:  No history of incontinence.   MUSCLOSKELETAL: No history of joint pain or swelling.  No myalgias.   SKIN: Negative for lesions, rash, and itching.   HEMATOLOGY/ONCOLOGY: Negative for prolonged bleeding, bruising easily, and swollen nodes.  No history of cancer.   ENDOCRINE: Negative for cold or heat intolerance,  polydipsia or goiter.   PSYCH:  +depression or anxiety symptoms.   NEURO: As Above.   Vital Signs:  Ht 5' 7.5" (1.715 m)   Wt 208 lb (94.3 kg)   BMI 32.10 kg/m    General Medical Exam:  Well appearing, comfortable.  Nonlabored breathing.  No deformity or edema.  No rash.  Neurological Exam: MENTAL STATUS including orientation to time, place, person, recent and remote memory, attention span and concentration, language, and fund of knowledge is normal.  Speech is not dysarthric.  CRANIAL NERVES:  Normal conjugate, extra-ocular eye movements in all directions of gaze.  No ptosis.  Normal facial symmetry and movements.  Normal shoulder shrug and head rotation.  Tongue is midline.  MOTOR:  Antigravity in all extremities.  No abnormal movements.  No pronator drift.  COORDINATION/GAIT: Normal finger to nose bilaterally.  Intact rapid alternating movements bilaterally.    Gait narrow based and stable   IMPRESSION: 1.  Chronic daily headaches 2.  Medication overuse headaches 3.  Meningioma, asymptomatic.   4.  Memory changes due to treatment resistant depression, follow-up with psychiatry  PLAN/RECOMMENDATIONS:  1.  Start topiramate 25 mg daily for 2 weeks then increase to 25 mg twice daily.  In the past, titration was limited due to paresthesias and she did better on Trokendi.  I will start prior authorization for Trokendi.  She has previously tried nortriptyline and propranolol. 2.  She was strongly urged to limit all over-the-counter NSAIDs to twice per week as this is contributing to her rebound headaches. 3.  MRI brain wo contrast to assess for stability of left frontal meningioma   Follow Up Instructions:  I discussed the assessment and treatment plan with the patient. The patient was provided an opportunity to ask questions and all were answered. The patient agreed with the plan and demonstrated an understanding of the instructions.   The patient was advised to call back or seek  an in-person evaluation if the symptoms worsen or if the condition fails to improve as anticipated.  Return to clinic in 4 months  Total Time spent:  45 min   [place], DO

## 2018-09-20 ENCOUNTER — Other Ambulatory Visit: Payer: Self-pay

## 2018-09-20 DIAGNOSIS — G43009 Migraine without aura, not intractable, without status migrainosus: Secondary | ICD-10-CM

## 2018-09-20 DIAGNOSIS — R519 Headache, unspecified: Secondary | ICD-10-CM

## 2018-09-20 DIAGNOSIS — D329 Benign neoplasm of meninges, unspecified: Secondary | ICD-10-CM

## 2018-10-15 ENCOUNTER — Other Ambulatory Visit: Payer: Self-pay | Admitting: Neurology

## 2018-10-18 ENCOUNTER — Other Ambulatory Visit: Payer: Self-pay

## 2018-11-06 ENCOUNTER — Ambulatory Visit
Admission: RE | Admit: 2018-11-06 | Discharge: 2018-11-06 | Disposition: A | Discharge status: Home / Self Care | Payer: BC Managed Care – PPO | Source: Ambulatory Visit | Attending: Neurology | Admitting: Neurology

## 2018-11-06 ENCOUNTER — Other Ambulatory Visit: Payer: Self-pay

## 2018-11-06 DIAGNOSIS — G43009 Migraine without aura, not intractable, without status migrainosus: Secondary | ICD-10-CM

## 2018-11-06 DIAGNOSIS — D329 Benign neoplasm of meninges, unspecified: Secondary | ICD-10-CM

## 2018-11-06 DIAGNOSIS — R519 Headache, unspecified: Secondary | ICD-10-CM

## 2018-11-09 ENCOUNTER — Telehealth: Payer: Self-pay | Admitting: Neurology

## 2018-11-09 NOTE — Telephone Encounter (Signed)
Patient said she received results via mychart and does not understand them. Please call her back with an explanation. Thanks!

## 2018-11-10 ENCOUNTER — Telehealth: Payer: Self-pay

## 2018-11-10 NOTE — Telephone Encounter (Signed)
SEE OTHER NOTE

## 2018-11-10 NOTE — Telephone Encounter (Signed)
Called spoke with patient she was made aware of results and understands

## 2018-11-10 NOTE — Telephone Encounter (Signed)
-----   Message from Alda Berthold, DO sent at 11/08/2018  7:49 AM EDT ----- Please inform patient that her MRI brain looks good and shows stable meningioma, no change in size since 2017.  Thanks.

## 2019-01-11 ENCOUNTER — Other Ambulatory Visit: Payer: Self-pay

## 2019-01-11 ENCOUNTER — Institutional Professional Consult (permissible substitution): Payer: Self-pay | Admitting: Psychiatry

## 2019-01-17 ENCOUNTER — Other Ambulatory Visit: Payer: Self-pay

## 2019-01-17 ENCOUNTER — Encounter: Payer: Self-pay | Admitting: Psychiatry

## 2019-01-17 ENCOUNTER — Telehealth: Payer: Self-pay | Admitting: Psychiatry

## 2019-01-17 ENCOUNTER — Ambulatory Visit (INDEPENDENT_AMBULATORY_CARE_PROVIDER_SITE_OTHER): Payer: BC Managed Care – PPO | Admitting: Psychiatry

## 2019-01-17 DIAGNOSIS — F332 Major depressive disorder, recurrent severe without psychotic features: Secondary | ICD-10-CM | POA: Diagnosis not present

## 2019-01-17 NOTE — Telephone Encounter (Signed)
01/17/19 2:46PM Called and spoke with patient due to patient insurance expired - pt has new insurance information obtained that information and instructed the patient to send me a copy of insurance card to scan in the system.Marland KitchenMariana Kaufman

## 2019-01-17 NOTE — Progress Notes (Signed)
This is an intake note for this 38 year old woman referred to me by Dr.Kaur for consideration of ECT.  Meeting took place over the telephone.  Reach patient by telephone.  She was appropriate cooperative and knowledgeable and appeared to be forthcoming.  Patient describes symptoms of depression going back to early adulthood.  She says that from what she recalls she does not think her depression has ever really abated.  She acknowledges that at one point the Zoloft may have made some improvement but the effect wore off despite high doses.  Other than that she says she has been feeling depressed down negative for 20 years.  Mood persistently down.  Frequently negative about herself.  Frequent hopelessness.  Fatigue done energetic little motivation.  Little enjoyment in any activities in her life.  She has suicidal thoughts but has no intention of acting on them.  Says that her parents are her main reason for not doing so.  She denies any psychotic symptoms.  Patient denies ever having anything like a manic episode.  She denies any current drug or alcohol abuse.  Patient sees her outpatient psychiatrist and is prescribed medication for depression and is currently on Prozac 80 mg/day Xanax 1 mg at night and Ambien 10 mg at night.  She is also receiving Spravato treatments 2 times a week but feels those have been of no benefit either.  Patient lives on her own but in a property owned by her parents.  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  She is separated from her husband.  Still on his insurance.  She has children but seems to have a very strained relationship with her adult children.  Feels very negative and down on herself about her financial situation as well.  Patient was appropriate in the interview.  Affect sounded appropriate dysphoric but reactive.  Thoughts appear lucid without any loosening of associations or delusions.  As noted previously she has suicidal thoughts without any specific intent or plan no  homicidal thoughts no psychotic thoughts.  Patient's short and long-term memory appear to be grossly intact to measurement and she was able to understand the treatment and make clinical decisions.  Patient's most significant medical history is that she has a known meningioma of approximately 9 mm which has been stable for years causing no symptoms.  Otherwise has migraines but is not on any treatment for them currently.  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  She name Zoloft Wellbutrin Pristiq Effexor and lithium all as being ineffective in addition to the current Prozac in addition to the Spravato.  She has passive suicidal thoughts and low functioning.  We discussed her meningioma.  I spoke with her most recent neurologist by telephone who reviewed the scan and the notes and tells me she thinks that the meningioma would be of no consequence or no impediment to ECT which would have also been my guess.  Patient is a reasonable candidate for electroconvulsive therapy.  Review reviewed the description of the treatment the full course of treatment.  The requirements for each day for outpatient treatment.  Patient asked appropriate questions and gave tentative consent to begin ECT a week from today.  I have sent a note to utilization review and nursing and put in an order for the lab tests.  Patient has my phone number to get in touch if she needs to.  We will tentatively plan to start ECT  on the 26th.  I sent a text message to her primary outpatient psychiatrist as well.

## 2019-01-21 ENCOUNTER — Other Ambulatory Visit: Admission: RE | Admit: 2019-01-21 | Payer: BC Managed Care – PPO | Source: Ambulatory Visit

## 2019-02-07 ENCOUNTER — Other Ambulatory Visit: Payer: Self-pay

## 2019-02-09 ENCOUNTER — Other Ambulatory Visit: Payer: Self-pay

## 2019-02-09 ENCOUNTER — Encounter: Payer: Self-pay | Admitting: Medical

## 2019-02-09 ENCOUNTER — Ambulatory Visit (INDEPENDENT_AMBULATORY_CARE_PROVIDER_SITE_OTHER): Payer: BC Managed Care – PPO | Admitting: Medical

## 2019-02-09 VITALS — BP 119/81 | HR 67 | Temp 96.9°F | Resp 16 | Ht 67.5 in | Wt 217.8 lb

## 2019-02-09 DIAGNOSIS — T148XXA Other injury of unspecified body region, initial encounter: Secondary | ICD-10-CM | POA: Diagnosis not present

## 2019-02-09 DIAGNOSIS — Z113 Encounter for screening for infections with a predominantly sexual mode of transmission: Secondary | ICD-10-CM | POA: Diagnosis not present

## 2019-02-09 LAB — CBC WITH DIFFERENTIAL/PLATELET
Basophils Absolute: 0 10*3/uL (ref 0.0–0.1)
Basophils Relative: 0.5 % (ref 0.0–3.0)
Eosinophils Absolute: 0.1 10*3/uL (ref 0.0–0.7)
Eosinophils Relative: 1.1 % (ref 0.0–5.0)
HCT: 46.2 % — ABNORMAL HIGH (ref 36.0–46.0)
Hemoglobin: 15.4 g/dL — ABNORMAL HIGH (ref 12.0–15.0)
Lymphocytes Relative: 31.2 % (ref 12.0–46.0)
Lymphs Abs: 2 10*3/uL (ref 0.7–4.0)
MCHC: 33.3 g/dL (ref 30.0–36.0)
MCV: 97.4 fl (ref 78.0–100.0)
Monocytes Absolute: 0.4 10*3/uL (ref 0.1–1.0)
Monocytes Relative: 6.7 % (ref 3.0–12.0)
Neutro Abs: 3.9 10*3/uL (ref 1.4–7.7)
Neutrophils Relative %: 60.5 % (ref 43.0–77.0)
Platelets: 221 10*3/uL (ref 150.0–400.0)
RBC: 4.75 Mil/uL (ref 3.87–5.11)
RDW: 13.5 % (ref 11.5–15.5)
WBC: 6.5 10*3/uL (ref 4.0–10.5)

## 2019-02-09 LAB — COMPREHENSIVE METABOLIC PANEL
ALT: 20 U/L (ref 0–35)
AST: 16 U/L (ref 0–37)
Albumin: 4.3 g/dL (ref 3.5–5.2)
Alkaline Phosphatase: 77 U/L (ref 39–117)
BUN: 21 mg/dL (ref 6–23)
CO2: 25 mEq/L (ref 19–32)
Calcium: 9.1 mg/dL (ref 8.4–10.5)
Chloride: 102 mEq/L (ref 96–112)
Creatinine, Ser: 0.73 mg/dL (ref 0.40–1.20)
GFR: 88.19 mL/min (ref >60.00)
Glucose, Bld: 94 mg/dL (ref 70–99)
Potassium: 4.7 mEq/L (ref 3.5–5.1)
Sodium: 134 mEq/L — ABNORMAL LOW (ref 135–145)
Total Bilirubin: 0.6 mg/dL (ref 0.2–1.2)
Total Protein: 6.7 g/dL (ref 6.0–8.3)

## 2019-02-09 NOTE — Progress Notes (Signed)
Subjective:    Patient ID: Angelica Diaz, female    DOB: 04/26/1980, 38 y.o.   MRN: [id number]  HPI  Pt in some recent random bruising on her arms. This has occurred some on arms and legs. She shows me some bruises in tricep areas and some on pretibial areas.  No trauma or falls. No nose bleeds reported.  On review of chart in epic it does show ED evaluation for alcohol intoxication.  From ED visit in summer "Pt assisted with calling her friend on cell phone. Pt wanting her friend, Angelica Diaz to come be with her. Pt continues to be tearful and sts "I'm not sick, I just fucked up". Pt worried about her father finding out she has been drinking and ended up at ER. Pt sts "It will kill him if he found out". Reassured pt no one will be told she is here unless she consents."  Pt clarifies not heavy drinker in summer was last time she drank. Not heavy drinker but was going through divorce.       Review of Systems  Constitutional: Negative for chills, fatigue and fever.  Respiratory: Negative for cough, chest tightness, shortness of breath and wheezing.   Cardiovascular: Negative for chest pain and palpitations.  Gastrointestinal: Negative for abdominal pain, constipation, diarrhea, nausea and vomiting.  Genitourinary: Negative for dysuria.  Musculoskeletal: Negative for back pain and neck stiffness.  Skin: Negative for rash.  Neurological: Negative for dizziness, syncope, weakness and light-headedness.  Hematological: Negative for adenopathy.  Psychiatric/Behavioral: Negative for behavioral problems, decreased concentration and hallucinations. The patient is not nervous/anxious.     Past Medical History:  Diagnosis Date  . Anxiety   . Arthritis   . Asthma    hx of in teens   . Depression   . Headache(784.0)   . Migraine   . PONV (postoperative nausea and vomiting)    and irritability after oral surgeries   . Sleep disorder      Social History   Socioeconomic History  .  Marital status: Single    Spouse name: Not on file  . Number of children: 2  . Years of education: Not on file  . Highest education level: Not on file  Occupational History  . Occupation: Animator: [employer]   Social Needs  . Financial resource strain: Not on file  . Food insecurity    Worry: Not on file    Inability: Not on file  . Transportation needs    Medical: Not on file    Non-medical: Not on file  Tobacco Use  . Smoking status: Current Every Day Smoker    Packs/day: 0.50    Years: 18.00    Pack years: 9.00    Types: Cigarettes  . Smokeless tobacco: Never Used  . Tobacco comment: form given 08-15-13  Substance and Sexual Activity  . Alcohol use: No    Alcohol/week: 0.0 standard drinks    Comment: Occasionally 2-3 times per year   . Drug use: No  . Sexual activity: Not on file  Lifestyle  . Physical activity    Days per week: Not on file    Minutes per session: Not on file  . Stress: Not on file  Relationships  . Social Herbalist on phone: Not on file    Gets together: Not on file    Attends religious service: Not on file    Active member of club or organization:  Not on file    Attends meetings of clubs or organizations: Not on file    Relationship status: Not on file  . Intimate partner violence    Fear of current or ex partner: Not on file    Emotionally abused: Not on file    Physically abused: Not on file    Forced sexual activity: Not on file  Other Topics Concern  . Not on file  Social History Narrative   Unemployed.      Applying for disability.      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  She has two healthy children.      Lives in single story home.      Right handed.      Highest level of edu- highschool       Past Surgical History:  Procedure Laterality Date  . CESAREAN SECTION    . CHOLECYSTECTOMY N/A 05/27/2013   Procedure: LAPAROSCOPIC CHOLECYSTECTOMY WITH INTRAOPERATIVE CHOLANGIOGRAM;  Surgeon: Earnstine Regal, MD;  Location: [facility];  Service: General;  Laterality: N/A;  . fallopian tube removal Bilateral 2018  . oral surgeries     . WISDOM TOOTH EXTRACTION      Family History  Problem Relation Age of Onset  . Migraines Mother   . Diabetes Maternal Grandfather   . Migraines Son   . Colon cancer Neg Hx   . Throat cancer Neg Hx   . Pancreatic cancer Neg Hx   . Stomach cancer Neg Hx   . Heart disease Neg Hx   . Kidney disease Neg Hx   . Liver disease Neg Hx     No Known Allergies  Current Outpatient Medications on File Prior to Visit  Medication Sig Dispense Refill  . ALPRAZolam (XANAX) 1 MG tablet Take 1 mg by mouth daily.    Marland Kitchen FLUoxetine HCl (PROZAC PO) Take 80 mg by mouth daily.    Marland Kitchen zolpidem (AMBIEN) 10 MG tablet Take 10 mg by mouth at bedtime as needed for sleep.     No current facility-administered medications on file prior to visit.     BP 119/81   Pulse 67   Temp (!) 96.9 F (36.1 C) (Temporal)   Resp 16   Ht 5' 7.5" (1.715 m)   Wt 217 lb 12.8 oz (98.8 kg)   SpO2 99%   BMI 33.61 kg/m       Objective:   Physical Exam  General Mental Status- Alert. General Appearance- Not in acute distress.   Skin General: Color- Normal Color. Moisture- Normal Moisture.  Neck Carotid Arteries- Normal color. Moisture- Normal Moisture. No carotid bruits. No JVD.  Chest and Lung Exam Auscultation: Breath Sounds:-Normal.  Cardiovascular Auscultation:Rythm- Regular. Murmurs & Other Heart Sounds:Auscultation of the heart reveals- No Murmurs.  Abdomen Inspection:-Inspeection Normal. Palpation/Percussion:Note:No mass. Palpation and Percussion of the abdomen reveal- Non Tender, Non Distended + BS, no rebound or guarding.  Neurologic Cranial Nerve exam:- CN III-XII intact(No nystagmus), symmetric smile. Strength:- 5/5 equal and symmetric strength both upper and lower extremities.  Skin- presently small bruise rt forearm, left tricep and rt distal lateral calf.  Pt  has some depression/bipolar. She is seeing her psychiatrist. Dr. Toy Diaz.3   Assessment & Plan:  You do have mild bruising on exam today. Will go ahead and get cmp and cbc today to assess. Will update you on those results when in.  Glad to hear no further ETOH use since summer.  Continue to follow up with Dr. Toy Diaz pscyhiatrist.  Follow up  date to be determined after lab review.  Noticed over due for pap. So can put in referral if you want or advise you to call and get scheduled where you got former done.  If you change mind on flu vaccine can just call and get scheduled for flu vaccine visit.  25 minutes spent with pt. 50% of time spent counseling on plan going forward.  Mackie Pai, PA-C

## 2019-02-09 NOTE — Patient Instructions (Addendum)
You do have mild bruising on exam today. Will go ahead and get cmp and cbc today to assess. Will update you on those results when in.  Glad to hear no further ETOH use since summer.  Continue to follow up with Dr. Toy Care pscyhiatrist.  Follow up date to be determined after lab review.  Noticed over due for pap. So can put in referral if you want or advise you to call and get scheduled where you got former done.(last done in 2018 per pt and normal so would be due in 2021. So please get old report)  If you change mind on flu vaccine can just call and get scheduled for flu vaccine visit.

## 2019-02-10 LAB — HIV ANTIBODY (ROUTINE TESTING W REFLEX): HIV 1&2 Ab, 4th Generation: NONREACTIVE

## 2019-03-10 DIAGNOSIS — Z8616 Personal history of COVID-19: Secondary | ICD-10-CM

## 2019-03-10 HISTORY — DX: Personal history of COVID-19: Z86.16

## 2019-06-04 DIAGNOSIS — F410 Panic disorder [episodic paroxysmal anxiety] without agoraphobia: Secondary | ICD-10-CM | POA: Diagnosis not present

## 2019-06-04 DIAGNOSIS — F341 Dysthymic disorder: Secondary | ICD-10-CM | POA: Diagnosis not present

## 2019-06-04 DIAGNOSIS — G4709 Other insomnia: Secondary | ICD-10-CM | POA: Diagnosis not present

## 2019-06-30 ENCOUNTER — Other Ambulatory Visit: Payer: Self-pay

## 2019-07-04 ENCOUNTER — Encounter: Payer: Self-pay | Admitting: *Deleted

## 2019-07-04 ENCOUNTER — Ambulatory Visit: Payer: BC Managed Care – PPO | Admitting: Medical

## 2019-07-15 ENCOUNTER — Ambulatory Visit: Payer: Medicaid Other

## 2019-10-18 DIAGNOSIS — Z20822 Contact with and (suspected) exposure to covid-19: Secondary | ICD-10-CM | POA: Diagnosis not present

## 2019-11-02 ENCOUNTER — Other Ambulatory Visit: Payer: Self-pay | Admitting: Psychiatric/Mental Health

## 2019-11-02 ENCOUNTER — Encounter (HOSPITAL_COMMUNITY): Payer: Self-pay | Admitting: Psychiatry

## 2019-11-02 ENCOUNTER — Emergency Department (HOSPITAL_COMMUNITY)
Admission: EM | Admit: 2019-11-02 | Discharge: 2019-11-02 | Disposition: A | Discharge status: Home / Self Care | Payer: BC Managed Care – PPO | Source: Home / Self Care | Attending: Emergency Medicine | Admitting: Emergency Medicine

## 2019-11-02 ENCOUNTER — Telehealth: Payer: Self-pay | Admitting: Medical

## 2019-11-02 ENCOUNTER — Encounter (HOSPITAL_COMMUNITY): Payer: Self-pay

## 2019-11-02 ENCOUNTER — Other Ambulatory Visit: Payer: Self-pay

## 2019-11-02 ENCOUNTER — Inpatient Hospital Stay (HOSPITAL_COMMUNITY)
Admission: AD | Admit: 2019-11-02 | Discharge: 2019-11-05 | DRG: 885 | Disposition: A | Discharge status: Home / Self Care | Payer: BC Managed Care – PPO | Source: Intra-hospital | Attending: Psychiatry | Admitting: Psychiatry

## 2019-11-02 DIAGNOSIS — Z560 Unemployment, unspecified: Secondary | ICD-10-CM | POA: Diagnosis not present

## 2019-11-02 DIAGNOSIS — D320 Benign neoplasm of cerebral meninges: Secondary | ICD-10-CM | POA: Diagnosis not present

## 2019-11-02 DIAGNOSIS — Z915 Personal history of self-harm: Secondary | ICD-10-CM

## 2019-11-02 DIAGNOSIS — T50902A Poisoning by unspecified drugs, medicaments and biological substances, intentional self-harm, initial encounter: Secondary | ICD-10-CM

## 2019-11-02 DIAGNOSIS — F419 Anxiety disorder, unspecified: Secondary | ICD-10-CM | POA: Diagnosis not present

## 2019-11-02 DIAGNOSIS — Z79899 Other long term (current) drug therapy: Secondary | ICD-10-CM

## 2019-11-02 DIAGNOSIS — G4700 Insomnia, unspecified: Secondary | ICD-10-CM | POA: Diagnosis present

## 2019-11-02 DIAGNOSIS — R9431 Abnormal electrocardiogram [ECG] [EKG]: Secondary | ICD-10-CM | POA: Diagnosis not present

## 2019-11-02 DIAGNOSIS — Z833 Family history of diabetes mellitus: Secondary | ICD-10-CM | POA: Diagnosis not present

## 2019-11-02 DIAGNOSIS — F332 Major depressive disorder, recurrent severe without psychotic features: Principal | ICD-10-CM | POA: Diagnosis present

## 2019-11-02 DIAGNOSIS — Z9049 Acquired absence of other specified parts of digestive tract: Secondary | ICD-10-CM

## 2019-11-02 DIAGNOSIS — R45851 Suicidal ideations: Secondary | ICD-10-CM | POA: Insufficient documentation

## 2019-11-02 DIAGNOSIS — J45909 Unspecified asthma, uncomplicated: Secondary | ICD-10-CM | POA: Insufficient documentation

## 2019-11-02 DIAGNOSIS — F17210 Nicotine dependence, cigarettes, uncomplicated: Secondary | ICD-10-CM | POA: Diagnosis present

## 2019-11-02 DIAGNOSIS — F341 Dysthymic disorder: Secondary | ICD-10-CM | POA: Insufficient documentation

## 2019-11-02 DIAGNOSIS — Z20822 Contact with and (suspected) exposure to covid-19: Secondary | ICD-10-CM | POA: Insufficient documentation

## 2019-11-02 LAB — RAPID URINE DRUG SCREEN, HOSP PERFORMED
Amphetamines: NOT DETECTED
Barbiturates: NOT DETECTED
Benzodiazepines: POSITIVE — AB
Cocaine: NOT DETECTED
Opiates: NOT DETECTED
Tetrahydrocannabinol: NOT DETECTED

## 2019-11-02 LAB — COMPREHENSIVE METABOLIC PANEL
ALT: 27 U/L (ref 0–44)
AST: 24 U/L (ref 15–41)
Albumin: 3.5 g/dL (ref 3.5–5.0)
Alkaline Phosphatase: 72 U/L (ref 38–126)
Anion gap: 7 (ref 5–15)
BUN: 14 mg/dL (ref 6–20)
CO2: 27 mmol/L (ref 22–32)
Calcium: 8.5 mg/dL — ABNORMAL LOW (ref 8.9–10.3)
Chloride: 104 mmol/L (ref 98–111)
Creatinine, Ser: 0.86 mg/dL (ref 0.44–1.00)
GFR calc Af Amer: >60 mL/min (ref >60)
GFR calc non Af Amer: >60 mL/min (ref >60)
Glucose, Bld: 91 mg/dL (ref 70–99)
Potassium: 4.3 mmol/L (ref 3.5–5.1)
Sodium: 138 mmol/L (ref 135–145)
Total Bilirubin: 0.8 mg/dL (ref 0.3–1.2)
Total Protein: 6.2 g/dL — ABNORMAL LOW (ref 6.5–8.1)

## 2019-11-02 LAB — ACETAMINOPHEN LEVEL
Acetaminophen (Tylenol), Serum: <10 ug/mL — ABNORMAL LOW (ref 10–30)
Acetaminophen (Tylenol), Serum: <10 ug/mL — ABNORMAL LOW (ref 10–30)

## 2019-11-02 LAB — I-STAT BETA HCG BLOOD, ED (MC, WL, AP ONLY): I-stat hCG, quantitative: <5 m[IU]/mL (ref <5)

## 2019-11-02 LAB — SARS CORONAVIRUS 2 BY RT PCR (HOSPITAL ORDER, PERFORMED IN ~~LOC~~ HOSPITAL LAB): SARS Coronavirus 2: NEGATIVE

## 2019-11-02 LAB — CBC
HCT: 48.8 % — ABNORMAL HIGH (ref 36.0–46.0)
Hemoglobin: 15.8 g/dL — ABNORMAL HIGH (ref 12.0–15.0)
MCH: 33.3 pg (ref 26.0–34.0)
MCHC: 32.4 g/dL (ref 30.0–36.0)
MCV: 103 fL — ABNORMAL HIGH (ref 80.0–100.0)
Platelets: 214 10*3/uL (ref 150–400)
RBC: 4.74 MIL/uL (ref 3.87–5.11)
RDW: 13.2 % (ref 11.5–15.5)
WBC: 7.3 10*3/uL (ref 4.0–10.5)
nRBC: 0 % (ref 0.0–0.2)

## 2019-11-02 LAB — ETHANOL: Alcohol, Ethyl (B): <10 mg/dL (ref <10)

## 2019-11-02 LAB — SALICYLATE LEVEL: Salicylate Lvl: <7 mg/dL — ABNORMAL LOW (ref 7.0–30.0)

## 2019-11-02 LAB — CBG MONITORING, ED: Glucose-Capillary: 93 mg/dL (ref 70–99)

## 2019-11-02 MED ORDER — THIAMINE HCL 100 MG PO TABS
100.0000 mg | ORAL_TABLET | Freq: Every day | ORAL | Status: DC
  Administered 2019-11-03 – 2019-11-05 (×3): 100 mg via ORAL
  Filled 2019-11-02 (×6): qty 1

## 2019-11-02 MED ORDER — ACETAMINOPHEN 325 MG PO TABS: 650.0000 mg | ORAL_TABLET | Freq: Four times a day (QID) | ORAL | Status: DC | PRN

## 2019-11-02 MED ORDER — LOPERAMIDE HCL 2 MG PO CAPS: 2.0000 mg | ORAL_CAPSULE | ORAL | Status: DC | PRN

## 2019-11-02 MED ORDER — ADULT MULTIVITAMIN W/MINERALS CH
1.0000 | ORAL_TABLET | Freq: Every day | ORAL | Status: DC
  Administered 2019-11-02 – 2019-11-05 (×4): 1 via ORAL
  Filled 2019-11-02 (×8): qty 1

## 2019-11-02 MED ORDER — TRAZODONE HCL 50 MG PO TABS: 50.0000 mg | ORAL_TABLET | Freq: Every evening | ORAL | Status: DC | PRN

## 2019-11-02 MED ORDER — MAGNESIUM HYDROXIDE 400 MG/5ML PO SUSP: 30.0000 mL | Freq: Every day | ORAL | Status: DC | PRN

## 2019-11-02 MED ORDER — ONDANSETRON 4 MG PO TBDP: 4.0000 mg | ORAL_TABLET | Freq: Four times a day (QID) | ORAL | Status: DC | PRN

## 2019-11-02 MED ORDER — ALUM & MAG HYDROXIDE-SIMETH 200-200-20 MG/5ML PO SUSP: 30.0000 mL | ORAL | Status: DC | PRN

## 2019-11-02 MED ORDER — LORAZEPAM 1 MG PO TABS: 1.0000 mg | ORAL_TABLET | Freq: Four times a day (QID) | ORAL | Status: DC | PRN

## 2019-11-02 MED ORDER — HYDROXYZINE HCL 25 MG PO TABS: 25.0000 mg | ORAL_TABLET | Freq: Four times a day (QID) | ORAL | Status: DC | PRN

## 2019-11-02 NOTE — ED Provider Notes (Signed)
[facility] DEPT Provider Note   CSN: [id number] Arrival date & time: 11/02/19  0343     History Chief Complaint  Patient presents with  . Drug Overdose    Angelica Diaz is a 39 y.o. female.  39 yo F with a cc of an intentional drug overdose.  The patient took Xanax, Ambien, fluoxetine.  She thinks she took this about an hour and a half ago.  Feeling somewhat sleepy.  She thinks that life made her take all these medications.  When I asked her if she took it to harm herself she said no.  She has been having frequent menstrual cycles.  She has an appointment to see OB/GYN in about a month.  She denies cough congestion fever denies abdominal pain denies chest pain denies shortness of breath.  The history is provided by the patient.  Drug Overdose This is a new problem. The current episode started yesterday. The problem occurs constantly. The problem has not changed since onset.Pertinent negatives include no chest pain, no headaches and no shortness of breath. Nothing aggravates the symptoms. Nothing relieves the symptoms. She has tried nothing for the symptoms. The treatment provided no relief.       Past Medical History:  Diagnosis Date  . Anxiety   . Arthritis   . Asthma    hx of in teens   . Depression   . Headache(784.0)   . Migraine   . PONV (postoperative nausea and vomiting)    and irritability after oral surgeries   . Sleep disorder     Patient Active Problem List   Diagnosis Date Noted  . MDD (major depressive disorder), recurrent severe, without psychosis ([place]) 11/02/2019  . Obesity 05/18/2015  . Bipolar I disorder ([place]) 05/18/2015  . RUQ abdominal pain 08/15/2013  . Biliary dyskinesia s/p lap chole 05/27/2013 05/25/2013  . Meningioma ([place]) 01/06/2013  . Type 2 HSV infection of vulvovaginal region 09/16/2012  . Extrinsic asthma 02/11/2008  . Disturbance in sleep behavior 12/20/2007  . Headache 06/25/2007  . PAP SMEAR, ABNORMAL  01/07/2007    Past Surgical History:  Procedure Laterality Date  . CESAREAN SECTION    . CHOLECYSTECTOMY N/A 05/27/2013   Procedure: LAPAROSCOPIC CHOLECYSTECTOMY WITH INTRAOPERATIVE CHOLANGIOGRAM;  Surgeon: Earnstine Regal, MD;  Location: [facility];  Service: General;  Laterality: N/A;  . fallopian tube removal Bilateral 2018  . oral surgeries     . WISDOM TOOTH EXTRACTION       OB History    Gravida  2   Para  2   Term      Preterm      AB      Living        SAB      TAB      Ectopic      Multiple      Live Births              Family History  Problem Relation Age of Onset  . Migraines Mother   . Diabetes Maternal Grandfather   . Migraines Son   . Colon cancer Neg Hx   . Throat cancer Neg Hx   . Pancreatic cancer Neg Hx   . Stomach cancer Neg Hx   . Heart disease Neg Hx   . Kidney disease Neg Hx   . Liver disease Neg Hx     Social History   Tobacco Use  . Smoking status: Current Every Day Smoker  Packs/day: 1.50    Years: 18.00    Pack years: 27.00    Types: Cigarettes  . Smokeless tobacco: Never Used  . Tobacco comment: declines supplementation  Vaping Use  . Vaping Use: Never used  Substance Use Topics  . Alcohol use: Yes    Alcohol/week: 0.0 standard drinks    Comment: socially 1x/week, 2-3 drinks  . Drug use: No    Home Medications Prior to Admission medications   Medication Sig Start Date End Date Taking? Authorizing Provider  ALPRAZolam Duanne Moron) 1 MG tablet Take 1 mg by mouth 2 (two) times daily as needed for anxiety.    Yes [provider]  FLUoxetine (PROZAC) 40 MG capsule Take 80 mg by mouth daily. 09/22/19  Yes [provider]  zolpidem (AMBIEN) 10 MG tablet Take 10 mg by mouth at bedtime as needed for sleep.   Yes [provider]    Allergies    Patient has no known allergies.  Review of Systems   Review of Systems  Constitutional: Negative for chills and fever.  HENT: Negative for congestion and  rhinorrhea.   Eyes: Negative for redness and visual disturbance.  Respiratory: Negative for shortness of breath and wheezing.   Cardiovascular: Negative for chest pain and palpitations.  Gastrointestinal: Negative for nausea and vomiting.  Genitourinary: Negative for dysuria and urgency.  Musculoskeletal: Negative for arthralgias and myalgias.  Skin: Negative for pallor and wound.  Neurological: Negative for dizziness and headaches.  Psychiatric/Behavioral: Positive for dysphoric mood. Negative for suicidal ideas.    Physical Exam Updated Vital Signs BP 112/83 (BP Location: Left Arm)   Pulse (!) 58   Temp 98.6 F (37 C) (Oral)   Resp 18   Ht 5' 7.5" (1.715 m)   Wt 99.8 kg   SpO2 98%   BMI 33.95 kg/m   Physical Exam Vitals and nursing note reviewed.  Constitutional:      General: She is not in acute distress.    Appearance: She is well-developed. She is not diaphoretic.     Comments: Slurred speech, sleepy  HENT:     Head: Normocephalic and atraumatic.  Eyes:     Pupils: Pupils are equal, round, and reactive to light.  Cardiovascular:     Rate and Rhythm: Normal rate and regular rhythm.     Heart sounds: No murmur heard.  No friction rub. No gallop.   Pulmonary:     Effort: Pulmonary effort is normal.     Breath sounds: No wheezing or rales.  Abdominal:     General: There is no distension.     Palpations: Abdomen is soft.     Tenderness: There is no abdominal tenderness.  Musculoskeletal:        General: No tenderness.     Cervical back: Normal range of motion and neck supple.  Skin:    General: Skin is warm and dry.  Neurological:     Mental Status: She is alert.     ED Results / Procedures / Treatments   Labs (all labs ordered are listed, but only abnormal results are displayed) Labs Reviewed  COMPREHENSIVE METABOLIC PANEL - Abnormal; Notable for the following components:      Result Value   Calcium 8.5 (*)    Total Protein 6.2 (*)    All other  components within normal limits  SALICYLATE LEVEL - Abnormal; Notable for the following components:   Salicylate Lvl <9.5 (*)    All other components within normal limits  ACETAMINOPHEN LEVEL - Abnormal; Notable for the following components:   Acetaminophen (Tylenol), Serum <10 (*)    All other components within normal limits  CBC - Abnormal; Notable for the following components:   Hemoglobin 15.8 (*)    HCT 48.8 (*)    MCV 103.0 (*)    All other components within normal limits  RAPID URINE DRUG SCREEN, HOSP PERFORMED - Abnormal; Notable for the following components:   Benzodiazepines POSITIVE (*)    All other components within normal limits  ACETAMINOPHEN LEVEL - Abnormal; Notable for the following components:   Acetaminophen (Tylenol), Serum <10 (*)    All other components within normal limits  SARS CORONAVIRUS 2 BY RT PCR (HOSPITAL ORDER, [facility])  ETHANOL  CBG MONITORING, ED  I-STAT BETA HCG BLOOD, ED ([facility], [facility], [facility] ONLY)    EKG EKG Interpretation  Date/Time:  Wednesday November 02 2019 04:03:03 EDT Ventricular Rate:  72 PR Interval:    QRS Duration: 83 QT Interval:  398 QTC Calculation: 436 R Axis:   96 Text Interpretation: Sinus rhythm Probable left atrial enlargement Borderline right axis deviation Borderline low voltage, extremity leads 12 Lead; Mason-L[place]ar No old tracing to compare Confirmed by Deno Etienne [phone]) on 11/02/2019 4:07:47 AM   Radiology No results found.  Procedures Procedures (including critical care time)  Medications Ordered in ED Medications - No data to display  ED Course  I have reviewed the triage vital signs and the nursing notes.  Pertinent labs & imaging results that were available during my care of the patient were reviewed by me and considered in my medical decision making (see chart for details).    MDM Rules/Calculators/A&P                          39 yo F with a cc of an intentional overdose.  Patient  denies it was to harm herself.  Sleepy on exam.  Will contact poison control.  Recommended consult patient had improvement of her mental status.  Recommended repeat Tylenol level at 4 hours postingestion.    Signed out to Dr. Vallery Ridge, please see her note for further details of care in the ED.  The patients results and plan were reviewed and discussed.   Any x-rays performed were independently reviewed by myself.   Differential diagnosis were considered with the presenting HPI.  Medications - No data to display  Vitals:   11/02/19 0356 11/02/19 1209 11/02/19 1722  BP: (!) 146/114 112/83 112/83  Pulse: 74 (!) 57 (!) 58  Resp: 18 18 18   Temp: 98 F (36.7 C) 97.8 F (36.6 C) 98.6 F (37 C)  TempSrc: Oral Oral Oral  SpO2: 100% 96% 98%  Weight: 99.8 kg    Height: 5' 7.5" (1.715 m)      Final diagnoses:  Intentional drug overdose, initial encounter [facility])  Suicidal ideation    Admission/ observation were discussed with the admitting physician, patient and/or family and they are comfortable with the plan.   Final Clinical Impression(s) / ED Diagnoses Final diagnoses:  Intentional drug overdose, initial encounter [facility])  Suicidal ideation    Rx / DC Orders ED Discharge Orders    None       Deno Etienne, DO 11/02/19 2316

## 2019-11-02 NOTE — BH Assessment (Signed)
Patient accepted to [facility] (adult) unit, pending negative COVID results. Once those results are available we will then provide [facility] staff with the accepting provider, nurse to nurse number, and bed assignment.

## 2019-11-02 NOTE — Telephone Encounter (Signed)
When call for follow up with me makes sure she has plans to see psychiatrist as well.

## 2019-11-02 NOTE — BH Assessment (Addendum)
[facility] Assessment Progress Note  Per Harriett Sine, NP, this pt requires psychiatric hospitalization.  Nash Mantis, RN has tentatively assigned pt to [facility] Rm 304-1 pending negative Covid-19 test results.  Pt presents under IVC initiated by [place], DO, and IVC documents have been faxed to [facility].  Pt's nurse has been notified, and agrees to call report to [phone].  Pt is to be transported via Event [employer].   Jalene Mullet, [place] Coordinator [phone]
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
## 2019-11-02 NOTE — ED Triage Notes (Signed)
Patient arrived stating she took 23 Xanax and an entire bottle of Ambien. Reports auditory hallucinations. Patient states this was an attempt to harm herself with no triggering factors, reporting she has been feeling suicidal for years.

## 2019-11-02 NOTE — BH Assessment (Addendum)
Assessment Note  Angelica Diaz is an 39 y.o. female presenting to [facility], voluntarily. She was transported to the hospital by her friend due to "having a break down". She reports taking a intentional overdose. Upon review of chart patient consumed  "Xanax, Ambien, fluoxetine". She took 60 Xanax and an entire bottle of Ambien. The amount of fluoxetine is unknown. Patient states that this was not an attempt to end her life. Her purpose in taking the pills was to "go to sleep". She When asked about triggering factors she states, "I don't take change very well". She has a new female roommate but regrets letting him move in her home because "he is a pig". She recently got a new job and states, "It's so overwhelming". She believes that these two major changes led to her intentional overdose.   Patient denies at the time of the assessment that she is suicidal. She denies prior attempts to end her life or commit suicide. Denies self mutilating behaviors. She has a family history of mental illness-mother (depresssion). Depressive symptoms include: hopelessness, isolating self from others, anger/irritability. She has anxiety; no panic attacks. She has a close family and states that they are her support system. Patient denies HI. No history of assaultve/aggressive behaviors. Patient denies visual hallucinations. However, states that she hears "face book messenger dings all day".  No alcohol and/or drug use reported.    Patient has no history of inpatient treatment for psychiatric stabilization. She was under the care of a therapist-Vickie Kae Heller. She didn't like therapy so decided not to pursue it any further. Her psychiatrist is Dr. Kerin Salen.   Patient is alert and oriented to time, person, place, and situation. Speech is slurred during periods of the assessment and soft. Insight and impulse control is poor. Appetite is "up and down". She sleeps 8-9 hrs per day.      Diagnosis: Major Depressive Disorder,  Recurrent, Severe, without psychotic features  Past Medical History:  Past Medical History:  Diagnosis Date  . Anxiety   . Arthritis   . Asthma    hx of in teens   . Depression   . Headache(784.0)   . Migraine   . PONV (postoperative nausea and vomiting)    and irritability after oral surgeries   . Sleep disorder     Past Surgical History:  Procedure Laterality Date  . CESAREAN SECTION    . CHOLECYSTECTOMY N/A 05/27/2013   Procedure: LAPAROSCOPIC CHOLECYSTECTOMY WITH INTRAOPERATIVE CHOLANGIOGRAM;  Surgeon: Earnstine Regal, MD;  Location: [facility];  Service: General;  Laterality: N/A;  . fallopian tube removal Bilateral 2018  . oral surgeries     . WISDOM TOOTH EXTRACTION      Family History:  Family History  Problem Relation Age of Onset  . Migraines Mother   . Diabetes Maternal Grandfather   . Migraines Son   . Colon cancer Neg Hx   . Throat cancer Neg Hx   . Pancreatic cancer Neg Hx   . Stomach cancer Neg Hx   . Heart disease Neg Hx   . Kidney disease Neg Hx   . Liver disease Neg Hx     Social History:  reports that she has been smoking cigarettes. She has a 9.00 pack-year smoking history. She has never used smokeless tobacco. She reports that she does not drink alcohol and does not use drugs.  Additional Social History:     CIWA: CIWA-Ar BP: 112/83 Pulse Rate: (!) 57 COWS:    Allergies:  No Known Allergies  Home Medications: (Not in a hospital admission)   OB/GYN Status:  No LMP recorded. (Menstrual status: Other).  General Assessment Data [facility] of Assessment: [facility] TTS Assessment: In system Is this a Tele or Face-to-Face Assessment?: Tele Assessment Is this an Initial Assessment or a Re-assessment for this encounter?: Initial Assessment Patient Accompanied by::  (friend dropped me off ) Language Other than English: Yes Living Arrangements:  (with a roommate ) What gender do you identify as?: Female Date Telepsych consult ordered in [facility]:   (11/02/19) Marital status: Divorced Elwin Sleight name:  Chief Financial Officer ) Pregnancy Status: No Living Arrangements: Other (Comment) (with roommate ) Can pt return to current living arrangement?: Yes Admission Status: Voluntary Is patient capable of signing voluntary admission?: Yes Referral Source: Self/Family/Friend Insurance type:  ([employer] [place] )     [place] Arrangements: Other (Comment) (with roommate ) Legal Guardian:  (no legal guardian ) Name of Psychiatrist:  (Dr. Kerin Salen ) Name of Therapist:  ("I tried it but didn't like it"- Glenetta Borg)     Risk to self with the past 6 months Suicidal Ideation: Yes-Currently Present Has patient been a risk to self within the past 6 months prior to admission? : Yes Suicidal Intent: Yes-Currently Present Has patient had any suicidal intent within the past 6 months prior to admission? : Yes Is patient at risk for suicide?: Yes Suicidal Plan?:  (60 Xanax and an entire bottle of Ambien) Has patient had any suicidal plan within the past 6 months prior to admission? : No Specify Current Suicidal Plan:  (60 Xanax and an entire bottle of Ambien) Access to Means: Yes Specify Access to Suicidal Means:  (prescription pills ) Previous Attempts/Gestures: No How many times?:  (0) Other Self Harm Risks:  (denies ) Triggers for Past Attempts:  (stress; "I can't handle change well") Intentional Self Injurious Behavior: None Family Suicide History: Yes (depression-mom; Bipolar Disorder-daughter) Recent stressful life event(s): Other (Comment) (new job January 2021, New roommate a mo' ago) Persecutory voices/beliefs?: No Depression: Yes Depression Symptoms: Feeling worthless/self pity, Feeling angry/irritable, Loss of interest in usual pleasures, Guilt, Fatigue, Isolating Substance abuse history and/or treatment for substance abuse?: No Suicide prevention information given to non-admitted patients: Not applicable  Risk to Others  within the past 6 months Homicidal Ideation: No Does patient have any lifetime risk of violence toward others beyond the six months prior to admission? : No Thoughts of Harm to Others: No Current Homicidal Intent: No Current Homicidal Plan: No Access to Homicidal Means: No Identified Victim:  (n/a) History of harm to others?: No Assessment of Violence: None Noted Violent Behavior Description:  (currently calm and cooperative ) Does patient have access to weapons?: No Criminal Charges Pending?: No Does patient have a court date: No Is patient on probation?: No  Psychosis Hallucinations: Auditory ("I hear facebook messengers dings all the time") Delusions: None noted  Mental Status Report Appearance/Hygiene: In scrubs Eye Contact: Good Motor Activity: Freedom of movement Speech: Logical/coherent Level of Consciousness: Alert Mood: Depressed Affect: Depressed Anxiety Level: Severe Thought Processes: Relevant Judgement: Impaired Orientation: Person, Place, Time, Situation Obsessive Compulsive Thoughts/Behaviors: None  Cognitive Functioning Concentration: Normal Memory: Remote Intact, Recent Intact Is patient IDD: No Insight: Poor Impulse Control: Poor Appetite:  ("It's up and down") Have you had any weight changes? : No Change Sleep: No Change Total Hours of Sleep:  (8-9 hrs of sleep per night ) Vegetative Symptoms: None  ADLScreening [facility] Assessment Services)  Patient's cognitive ability adequate to safely complete daily activities?: Yes Patient able to express need for assistance with ADLs?: Yes Independently performs ADLs?: Yes (appropriate for developmental age)  Prior Inpatient Therapy Prior Inpatient Therapy: No  Prior Outpatient Therapy Prior Outpatient Therapy: Yes Prior Therapy Dates:  (currentl ) Prior Therapy Facilty/Provider(s):  (Dr. Lianne Moris ) Reason for Treatment:  (med management ) Does patient have an ACCT team?: No Does patient have  Intensive In-House Services?  : No Does patient have [facility] services? : No Does patient have P4CC services?: No  ADL Screening (condition at time of admission) Patient's cognitive ability adequate to safely complete daily activities?: Yes Patient able to express need for assistance with ADLs?: Yes Independently performs ADLs?: Yes (appropriate for developmental age)             Advance Directives ([place]) Does Patient Have a Medical Advance Directive?: No Would patient like information on creating a medical advance directive?: No - Patient declined          Disposition: Per Harriett Sine, patient meets inpatient treatment. Patient referred to [facility] for bed placement.  Disposition Initial Assessment Completed for this Encounter: Yes  On Site Evaluation by:   Reviewed with Physician:    Waldon Merl 11/02/2019 3:14 PM

## 2019-11-02 NOTE — Progress Notes (Signed)
Psychoeducational Group Note  Date:  11/02/2019 Time:  2030  Group Topic/Focus:  wrap up group  Participation Level: Did Not Attend  Participation Quality:  Not Applicable  Affect:  Not Applicable  Cognitive:  Not Applicable  Insight:  Not Applicable  Engagement in Group: Not Applicable  Additional Comments:  Pt was asleep during group time.  Shellia Cleverly 11/02/2019, 9:13 PM

## 2019-11-02 NOTE — ED Provider Notes (Signed)
Multiple OD. Per Poison Control should be able to clear at 6 hours. Needs repeat Tylenol level and TTS consult. Physical Exam  BP (!) 146/114 (BP Location: Right Arm)   Pulse 74   Temp 98 F (36.7 C) (Oral)   Resp 18   Ht 5' 7.5" (1.715 m)   Wt 99.8 kg   SpO2 100%   BMI 33.95 kg/m   Physical Exam  ED Course/Procedures     Procedures  MDM         Charlesetta Shanks, MD 11/02/19 1541

## 2019-11-02 NOTE — Tx Team (Cosign Needed)
Initial Treatment Plan 11/02/2019 6:45 PM Angelica Diaz [id number]    PATIENT STRESSORS: Health problems Marital or family conflict Occupational concerns   PATIENT STRENGTHS: Ability for insight Average or above average intelligence Motivation for treatment/growth Supportive family/friends Work skills   PATIENT IDENTIFIED PROBLEMS: anxiety  depression  Suicidal ideations  Estranged children  "overbearing parents"  "I hate my job"           DISCHARGE CRITERIA:  Ability to meet basic life and health needs Improved stabilization in mood, thinking, and/or behavior Motivation to continue treatment in a less acute level of care Need for constant or close observation no longer present  PRELIMINARY DISCHARGE PLAN: Attend PHP/IOP Outpatient therapy Return to previous living arrangement Return to previous work or school arrangements  PATIENT/FAMILY INVOLVEMENT: This treatment plan has been presented to and reviewed with the patient, Angelica Diaz.  The patient and family have been given the opportunity to ask questions and make suggestions.  Baron Sane, RN 11/02/2019, 6:45 PM

## 2019-11-02 NOTE — ED Notes (Signed)
Patient changed in scrubs. Patient belongings with purse placed behind nurses station

## 2019-11-03 DIAGNOSIS — F332 Major depressive disorder, recurrent severe without psychotic features: Principal | ICD-10-CM

## 2019-11-03 MED ORDER — FLUOXETINE HCL 20 MG PO CAPS
40.0000 mg | ORAL_CAPSULE | Freq: Every day | ORAL | Status: DC
  Administered 2019-11-03 – 2019-11-04 (×2): 40 mg via ORAL
  Filled 2019-11-03 (×6): qty 2

## 2019-11-03 MED ORDER — ALPRAZOLAM 0.5 MG PO TABS
1.0000 mg | ORAL_TABLET | Freq: Two times a day (BID) | ORAL | Status: DC | PRN
  Administered 2019-11-03 (×2): 1 mg via ORAL
  Filled 2019-11-03 (×2): qty 2
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
## 2019-11-03 NOTE — Telephone Encounter (Signed)
I did not realize that. Just wanted to make sure she was seen by psychiatrist before she sees me. So you don't have to call. thanks

## 2019-11-03 NOTE — Telephone Encounter (Signed)
[facility] is [facility] at a [facility] per care everywhere do you still want me to call her

## 2019-11-03 NOTE — Progress Notes (Signed)
Pt did not attend wrap-up group   

## 2019-11-03 NOTE — Progress Notes (Signed)
   11/03/19 2232  Psych Admission Type (Psych Patients Only)  Admission Status Involuntary  Psychosocial Assessment  Patient Complaints Anxiety  Eye Contact Fair  Facial Expression Anxious;Pensive  Affect Anxious;Irritable  Speech Logical/coherent;Slow  Interaction Avoidant;Guarded;Isolative  Motor Activity Lethargic;Unsteady  Appearance/Hygiene In scrubs  Behavior Characteristics Cooperative  Mood Anxious  Thought Process  Coherency Concrete thinking  Content Blaming others  Delusions Controlled;Persecutory  Perception WDL  Hallucination None reported or observed  Judgment Poor  Confusion None  Danger to Self  Current suicidal ideation? Denies  Danger to Others  Danger to Others None reported or observed  D: Patient calm and cooperative reports she had a good day. Pt mostly in room sleeping. A: Medications administered as prescribed. Support and encouragement provided as needed.  R: Patient remains safe on the unit. Will continue to monitor for safety and stability.

## 2019-11-03 NOTE — Progress Notes (Signed)
   11/03/19 0000  Psych Admission Type (Psych Patients Only)  Admission Status Involuntary  Psychosocial Assessment  Patient Complaints Apathy;Hopelessness;Depression  Eye Contact Fair  Facial Expression Blank;Flat;Pensive;Sullen;Sad;Worried  Affect Anxious;Depressed;Sad;Sullen  Speech Logical/coherent;Slow  Interaction Assertive  Motor Activity Lethargic;Unsteady  Appearance/Hygiene In scrubs  Behavior Characteristics Cooperative;Anxious  Mood Despair;Anxious  Thought Process  Coherency WDL  Content Blaming others  Delusions Controlled;Persecutory  Perception WDL  Hallucination None reported or observed  Judgment Poor  Confusion None  Danger to Self  Current suicidal ideation? Denies  Danger to Others  Danger to Others None reported or observed
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
## 2019-11-03 NOTE — Plan of Care (Signed)
Progress note  D: pt found in bed; compliant with morning assessment. Pt seems irritable and anxious on approach. Pt's affect is one of anger. Pt stated they still hadn't seen the MD and they are ready to leave. Pt denies any physical symptoms. Pt denies the AH they have been experiencing in the past. Pt has been reclusive to their room and has failed to interact or program in the milieu today. Pt is pleasant though. Pt denies si/hi/ah/vh and verbally agrees to approach staff if these become apparent or before harming themself/others while at [place].  A: Pt provided support and encouragement. Pt given medication per protocol and standing orders. Q41m safety checks implemented and continued.  R: Pt safe on the unit. Will continue to monitor.  Pt progressing in the following metrics  Problem: Education: Goal: Ability to state activities that reduce stress will improve Outcome: Progressing   Problem: Self-Concept: Goal: Level of anxiety will decrease Outcome: Progressing   Problem: Education: Goal: Knowledge of the prescribed therapeutic regimen will improve Outcome: Progressing   Problem: Activity: Goal: Imbalance in normal sleep/wake cycle will improve Outcome: Progressing

## 2019-11-04 LAB — TSH: TSH: 1.868 u[IU]/mL (ref 0.350–4.500)

## 2019-11-04 MED ORDER — ALPRAZOLAM 0.5 MG PO TABS
1.0000 mg | ORAL_TABLET | Freq: Two times a day (BID) | ORAL | Status: DC | PRN
  Administered 2019-11-04: 1 mg via ORAL
  Filled 2019-11-04: qty 2

## 2019-11-04 MED ORDER — FLUOXETINE HCL 20 MG PO CAPS
80.0000 mg | ORAL_CAPSULE | Freq: Every day | ORAL | Status: DC
  Administered 2019-11-05: 80 mg via ORAL
  Filled 2019-11-04 (×3): qty 4

## 2019-11-04 MED ORDER — TRAZODONE HCL 50 MG PO TABS
50.0000 mg | ORAL_TABLET | Freq: Every evening | ORAL | Status: DC | PRN
  Administered 2019-11-04: 50 mg via ORAL
  Filled 2019-11-04: qty 1

## 2019-11-04 MED ORDER — FLUOXETINE HCL 20 MG PO CAPS
60.0000 mg | ORAL_CAPSULE | Freq: Every day | ORAL | Status: DC
  Filled 2019-11-04: qty 3

## 2019-11-04 NOTE — Progress Notes (Signed)
[facility] MD Progress Note  11/04/2019 10:17 AM Angelica Diaz  MRN:  [id number] Subjective:  Pt is seen and examined today. Pt states she is feeling "Stable" today. Pt states she didn't get good sleep last night as nurses were checking on her every 10-15 mins. Nursing notes indicate that Pt slept for 6.25 hours last night. Pt states her appetite is good. Pt denies any side efects from medications. Pt denies any withdrawal effects or anxiety. Pt denies any suicidal ideation and homicidal ideation. Pt denies any visual and auditory hallucination. Pt denies any headache, nausea, vomiting, dizziness, chest pain, SOB, abdominal pain, diarrhea, and constipation. Pt denies any racing thoughts. Pt is focused on discharge and wants to go.  Denies Tremors. On Examination, Pt is calm, cooperative and oriented x4. Pt's mood is Stable and her affect is "constricted". Denies SI, HI and AVH.  Objective: Pt is 39 year old, divorced with a past history of chronic Depression presented to ED on 8/4 following overdose on prescribed medications ( Ambien/Xanax) . Currently, she reports she took a handful of each (ED notes indicate she took 82 Xanax and entire bottle of Ambien, which she currently denies).    Principal Problem: <principal problem not specified> Diagnosis: Active Problems:   MDD (major depressive disorder), recurrent severe, without psychosis ([place])  Total Time spent with patient: 20 minutes  Past Psychiatric History: Chronic Depression  Past Medical History:  Past Medical History:  Diagnosis Date   Anxiety    Arthritis    Asthma    hx of in teens    Depression    Headache(784.0)    Migraine    PONV (postoperative nausea and vomiting)    and irritability after oral surgeries    Sleep disorder     Past Surgical History:  Procedure Laterality Date   CESAREAN SECTION     CHOLECYSTECTOMY N/A 05/27/2013   Procedure: LAPAROSCOPIC CHOLECYSTECTOMY WITH INTRAOPERATIVE CHOLANGIOGRAM;  Surgeon:  Earnstine Regal, MD;  Location: [facility];  Service: General;  Laterality: N/A;   fallopian tube removal Bilateral 2018   oral surgeries      WISDOM TOOTH EXTRACTION     Family History:  Family History  Problem Relation Age of Onset   Migraines Mother    Diabetes Maternal Grandfather    Migraines Son    Colon cancer Neg Hx    Throat cancer Neg Hx    Pancreatic cancer Neg Hx    Stomach cancer Neg Hx    Heart disease Neg Hx    Kidney disease Neg Hx    Liver disease Neg Hx    Family Psychiatric  History: None Reported Social History:  Social History   Substance and Sexual Activity  Alcohol Use Yes   Alcohol/week: 0.0 standard drinks   Comment: socially 1x/week, 2-3 drinks     Social History   Substance and Sexual Activity  Drug Use No    Social History   Socioeconomic History   Marital status: Single    Spouse name: Not on file   Number of children: 2   Years of education: Not on file   Highest education level: Not on file  Occupational History   Occupation: Animator: [employer]   Tobacco Use   Smoking status: Current Every Day Smoker    Packs/day: 1.50    Years: 18.00    Pack years: 27.00    Types: Cigarettes   Smokeless tobacco: Never Used   Tobacco comment:  declines supplementation  Vaping Use   Vaping Use: Never used  Substance and Sexual Activity   Alcohol use: Yes    Alcohol/week: 0.0 standard drinks    Comment: socially 1x/week, 2-3 drinks   Drug use: No   Sexual activity: Not Currently  Other Topics Concern   Not on file  Social History Narrative   Unemployed.      Applying for disability.      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  She has two healthy children.      Lives in single story home.      Right handed.      Highest level of edu- highschool      Social Determinants of Health   Financial Resource Strain:    Difficulty of Paying Living Expenses:   Food Insecurity:    Worried About Paediatric nurse in the Last Year:    Arboriculturist in the Last Year:   Transportation Needs:    Film/video editor (Medical):    Lack of Transportation (Non-Medical):   Physical Activity:    Days of Exercise per Week:    Minutes of Exercise per Session:   Stress:    Feeling of Stress :   Social Connections:    Frequency of Communication with Friends and Family:    Frequency of Social Gatherings with Friends and Family:    Attends Religious Services:    Active Member of Clubs or Organizations:    Attends Archivist Meetings:    Marital Status:    Additional Social History:                         Sleep: Poor  Appetite:  Good  Current Medications: Current Facility-Administered Medications  Medication Dose Route Frequency Provider Last Rate Last Admin   acetaminophen (TYLENOL) tablet 650 mg  650 mg Oral Q6H PRN Connye Burkitt, NP       ALPRAZolam Duanne Moron) tablet 1 mg  1 mg Oral BID PRN Cobos, Myer Peer, MD   1 mg at 11/03/19 2136   FLUoxetine (PROZAC) capsule 40 mg  40 mg Oral Daily Cobos, Myer Peer, MD   40 mg at 11/04/19 0801   multivitamin with minerals tablet 1 tablet  1 tablet Oral Daily Connye Burkitt, NP   1 tablet at 11/04/19 9702   thiamine tablet 100 mg  100 mg Oral Daily Connye Burkitt, NP   100 mg at 11/04/19 6378    Lab Results:  Results for orders placed or performed during the hospital encounter of 11/02/19 (from the past 48 hour(s))  TSH     Status: None   Collection Time: 11/04/19  6:52 AM  Result Value Ref Range   TSH 1.868 0.350 - 4.500 uIU/mL    Comment: Performed by a 3rd Generation assay with a functional sensitivity of <=0.01 uIU/mL. Performed at [facility], [place] [address] [place], [place] [address]     Blood Alcohol level:  Lab Results  Component Value Date   ETH <10 27/74/1287    Metabolic Disorder Labs: No results found for: HGBA1C, MPG No results found for: PROLACTIN Lab  Results  Component Value Date   CHOL 268 (A) 05/14/2015   TRIG 196 (A) 05/14/2015   HDL 81 (A) 05/14/2015   CHOLHDL 4 01/24/2015   VLDL 26.2 01/24/2015   LDLCALC 102 05/14/2015   LDLCALC 98 01/24/2015    Physical Findings: AIMS: Facial  and Oral Movements Muscles of Facial Expression: None, normal Lips and Perioral Area: None, normal Jaw: None, normal Tongue: None, normal,Extremity Movements Upper (arms, wrists, hands, fingers): None, normal Lower (legs, knees, ankles, toes): None, normal, Trunk Movements Neck, shoulders, hips: None, normal, Overall Severity Severity of abnormal movements (highest score from questions above): None, normal Incapacitation due to abnormal movements: None, normal Patient's awareness of abnormal movements (rate only patient's report): No Awareness, Dental Status Current problems with teeth and/or dentures?: No Does patient usually wear dentures?: No  CIWA:  CIWA-Ar Total: 1 COWS:     Musculoskeletal: Strength & Muscle Tone: within normal limits Gait & Station: normal Patient leans: N/A  Psychiatric Specialty Exam: Physical Exam Vitals and nursing note reviewed.  Constitutional:      General: She is not in acute distress.    Appearance: Normal appearance. She is not ill-appearing, toxic-appearing or diaphoretic.  HENT:     Head: Normocephalic and atraumatic.  Pulmonary:     Effort: Pulmonary effort is normal.  Neurological:     Mental Status: She is alert and oriented to person, place, and time.     Review of Systems  Constitutional: Negative for activity change, appetite change, fatigue and fever.  Respiratory: Negative for apnea, chest tightness and shortness of breath.   Cardiovascular: Negative for chest pain.  Gastrointestinal: Negative for abdominal distention, abdominal pain, constipation, diarrhea, nausea and vomiting.  Neurological: Negative for light-headedness and headaches.  Psychiatric/Behavioral: Positive for dysphoric  mood. Negative for agitation, confusion and suicidal ideas. The patient is not nervous/anxious.     Blood pressure 108/73, pulse 70, temperature 97.8 F (36.6 C), temperature source Oral, resp. rate 18, height 5\' 7"  (1.702 m), weight 106.1 kg, SpO2 100 %.Body mass index is 36.65 kg/m.  General Appearance: Casual  Eye Contact:  Good  Speech:  Normal Rate  Volume:  Normal  Mood:  Reports mood as Stable  Affect:  Constricted  Thought Process:  Linear and Descriptions of Associations: Intact  Orientation:  Full (Time, Place, and Person)  Thought Content:  Delusions and Hallucinations: None  Suicidal Thoughts:  No  Homicidal Thoughts:  No  Memory:  Immediate;   Fair Recent;   Fair Remote;   Fair  Judgement:  Fair  Insight:  Fair  Psychomotor Activity:  Normal  Concentration:  Concentration: Good and Attention Span: Good  Recall:  Good  Fund of Knowledge:  Good  Language:  Good  Akathisia:  Negative  Handed:  Right  AIMS (if indicated):     Assets:  Communication Skills  ADL's:  Intact  Cognition:  WNL  Sleep:  Number of Hours: 6.25     Treatment Plan Summary:Pt is seen and examined today. Pt states she is feeling "Stable" today. Pt states she didn't get good sleep last night as nurses were checking on her every 10-15 mins. Nursing notes indicate that Pt slept for 6.25 hours last night. Pt states her appetite is good. Pt denies any side efects from medications. Pt denies any withdrawal effects or anxiety. Pt denies any suicidal ideation and homicidal ideation. Pt denies any visual and auditory hallucination. Pt denies any headache, nausea, vomiting, dizziness, chest pain, SOB, abdominal pain, diarrhea, and constipation. Pt denies any racing thoughts. Pt is focused on discharge and wants to go.  On Examination, Pt is calm, cooperative and oriented x4. Pt's mood is Stable and her affect is "constricted". Denies SI, HI and AVH. BP- 108/73 mmHg, PR -70/min Recent Labs- TSH- 1.868 Pt  is  tolerating Prozac and Xanax well. No withdrawal effects and no S/E's Plan- Daily contact with patient to assess and evaluate symptoms and progress in treatment -Monitor Vitals -Monitor for SI- 15 mins Checks - Monitor for medication side effects. -Continue Xanax 1mg  BID PRN -Continue Prozac 40mg  Daily -Continue Thiamin 100mg  Daily -Continue Multivitamin with Minerals  Armando Reichert, MD 11/04/2019, 10:17 AM

## 2019-11-04 NOTE — Progress Notes (Signed)
  Pt is caucasian female  of 39 y.o. , DOB [birth year], MRN  [id number]  presents IVC with a SI by overdose   Hx of poly substance abuse.    Patient Self Inventory Sheet  reports poor appetite, energy low, and poor sleeping pattern with mediaction. Pt rates  depression 7 out of 10, hopelessness 5 out of 10, and anxiety at 9 out of 10. Denies  HI or AVH.  Pt reports LBM yesterday, no  pain. Pt denies using any medications for pain. Vitals signs being monitored.  Medication given as Rx, no adverse reaction observed, safety maintained with q15 minute checks.   Lolly Mustache. Bobby Rumpf MSN, [place], [facility] [phone]

## 2019-11-04 NOTE — Progress Notes (Signed)
   11/04/19 1935  COVID-19 Daily Checkoff  Have you had a fever (temp > 37.80C/100F)  in the past 24 hours?  No  COVID-19 EXPOSURE  Have you traveled outside the state in the past 14 days? No  Have you been in contact with someone with a confirmed diagnosis of COVID-19 or PUI in the past 14 days without wearing appropriate PPE? No  Have you been living in the same home as a person with confirmed diagnosis of COVID-19 or a PUI (household contact)? No  Have you been diagnosed with COVID-19? No

## 2019-11-04 NOTE — Progress Notes (Signed)
Pt did not attend wrap-up group   

## 2019-11-04 NOTE — Progress Notes (Signed)

## 2019-11-04 NOTE — Progress Notes (Signed)
   11/04/19 1935  Psych Admission Type (Psych Patients Only)  Admission Status Involuntary  Psychosocial Assessment  Patient Complaints Anxiety;Insomnia  Eye Contact Fair  Facial Expression Anxious;Pensive;Sad  Affect Anxious;Depressed  Speech Logical/coherent;Slow  Interaction Assertive  Motor Activity Unsteady  Appearance/Hygiene Unremarkable  Behavior Characteristics Cooperative;Anxious;Fidgety  Mood Depressed;Anxious  Thought Process  Coherency Concrete thinking  Content Blaming others  Delusions None reported or observed  Perception WDL  Hallucination None reported or observed  Judgment Poor  Confusion None  Danger to Self  Current suicidal ideation? Denies  Danger to Others  Danger to Others None reported or observed

## 2019-11-04 NOTE — Progress Notes (Signed)
Recreation Therapy Notes  Date: 8.6.21 Time: 0930 Location: [address] Dayroom  Group Topic: Stress Management  Goal Area(s) Addresses:  Patient will identify positive stress management techniques. Patient will identify benefits of using stress management post d/c.  Intervention: Stress Management  Activity: Guided Imagery.  LRT read a script that guided patients to envision their peaceful place.  Patients were to listen and follow along as script was read to engage in activity.    Education:  Stress Management, Discharge Planning.   Education Outcome: Acknowledges Education  Clinical Observations/Feedback: Pt did not attend group activity.    Victorino Sparrow, LRT/CTRS         Victorino Sparrow A 11/04/2019 12:02 PM

## 2019-11-05 MED ORDER — ALPRAZOLAM 1 MG PO TABS: 1.0000 mg | ORAL_TABLET | Freq: Two times a day (BID) | ORAL | 1 refills | Status: DC

## 2019-11-05 MED ORDER — ADULT MULTIVITAMIN W/MINERALS CH: 1.0000 | ORAL_TABLET | Freq: Every day | ORAL | 0 refills | Status: DC

## 2019-11-05 MED ORDER — THIAMINE HCL 100 MG PO TABS: 100.0000 mg | ORAL_TABLET | Freq: Every day | ORAL | 0 refills | Status: DC

## 2019-11-05 NOTE — Progress Notes (Signed)
°  [facility] Adult Case Management Discharge Plan :  Will you be returning to the same living situation after discharge:  Yes,  roommate At discharge, do you have transportation home?: Yes,  mother Do you have the ability to pay for your medications: Yes,  insurance  Release of information consent forms completed and emailed to Medical Records, then turned in to Medical Records by CSW.   Patient to Follow up at:  Follow-up Information    Chucky May, MD. Go on 12/07/2019.   Specialty: Psychiatry Why: You have an appointment for medication management 12/07/19 at 12:45 pm.  This appointment will be held in person (you may change to Virtual if you wish). Contact information: [place] [address] [phone]               Next level of care provider has access to [place] and Suicide Prevention discussed: Yes,  with mother     Has patient been referred to the Quitline?: N/A patient is not a smoker  Patient has been referred for addiction treatment: N/A  Maretta Los, LCSW 11/05/2019, 10:07 AM

## 2019-11-05 NOTE — [facility] Suicide Risk Assessment (Signed)
[place] INPATIENT:  Family/Significant Other Suicide Prevention Education  Suicide Prevention Education:  Education Completed; Angelica Diaz, mother [phone],  (name of family member/significant other) has been identified by the patient as the family member/significant other with whom the patient will be residing, and identified as the person(s) who will aid the patient in the event of a mental health crisis (suicidal ideations/suicide attempt).  With written consent from the patient, the family member/significant other has been provided the following suicide prevention education, prior to the and/or following the discharge of the patient.  The suicide prevention education provided includes the following:  Suicide risk factors  Suicide prevention and interventions  National Suicide Hotline telephone number  [facility] assessment telephone number  [facility] Emergency Assistance [place] and/or Residential Mobile Crisis Unit telephone number  Request made of family/significant other to:  Remove weapons (e.g., guns, rifles, knives), all items previously/currently identified as safety concern.    Remove drugs/medications (over-the-counter, prescriptions, illicit drugs), all items previously/currently identified as a safety concern.  The family member/significant other verbalizes understanding of the suicide prevention education information provided.  The family member/significant other agrees to remove the items of safety concern listed above.  Mother stated patient has never been suicidal, and that this hospitalization is the worst place for her to be, but maybe it taught her a lesson.  She has no concerns about patient's discharge.  Berlin Hun Grossman-Orr 11/05/2019, 10:02 AM
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
## 2019-11-05 NOTE — [facility] Suicide Risk Assessment (Addendum)
[facility] Discharge Suicide Risk Assessment   Principal Problem: S/P Overdose  Discharge Diagnoses: Active Problems:   MDD (major depressive disorder), recurrent severe, without psychosis ([place])   Total Time spent with patient: 30 minutes  Musculoskeletal: Strength & Muscle Tone: within normal limits Gait & Station: normal Patient leans: N/A  Psychiatric Specialty Exam: Review of Systems denies headache, no chest pain, no shortness of breath, no vomiting , no fever or chills   Blood pressure (!) 124/91, pulse 65, temperature 98.3 F (36.8 C), temperature source Oral, resp. rate 18, height 5\' 7"  (1.702 m), weight 106.1 kg, SpO2 99 %.Body mass index is 36.65 kg/m.  General Appearance: improving grooming  Eye Contact::  Good  Speech:  Normal [id number]  Volume:  Normal  Mood:  reports mood is " all right". Denies feeling depressed at present  Affect:  improving , fuller in range   Thought Process:  Linear and Descriptions of Associations: Intact  Orientation:  Full (Time, Place, and Person)  Thought Content:  no hallucinations, no delusions, not internally preoccupied   Suicidal Thoughts:  No denies suicidal or self-injurious ideations and presents future oriented  Homicidal Thoughts:  No denies homicidal or violent ideations  Memory:  Recent and remote grossly intact  Judgement:  Other:  Improving  Insight:  Fair/improving  Psychomotor Activity:  Normal no psychomotor agitation or restlessness noted at this time  Concentration:  Good  Recall:  Good  Fund of Knowledge:Good  Language: Good  Akathisia:  Negative  Handed:  Right  AIMS (if indicated):     Assets:  Communication Skills Desire for Improvement Resilience Social Support  Sleep:  Number of Hours: 6.75  Cognition: WNL  ADL's:  Intact   Mental Status Per Nursing Assessment::   On Admission:  Suicidal ideation indicated by patient, Plan includes specific time, place, or method, Intention to act on suicide plan, Self-harm  thoughts, Belief that plan would result in death, Suicide plan  Demographic Factors:  39 year old female, divorced, has 2 adult children, employed  Loss Factors: Work-related stress  Historical Factors: No prior psychiatric admissions, no past history of suicide attempts.  Long history of management with Xanax, Prozac, Ambien  Risk Reduction Factors:   Sense of responsibility to family, Employed, Living with another person, especially a relative, Positive social support and Positive coping skills or problem solving skills  Continued Clinical Symptoms:  She presents alert, attentive, calm without psychomotor agitation or restlessness, describes mood as within normal and denies feeling depressed.  Affect is reactive, no thought disorders noted.  Denies suicidal ideations, denies homicidal ideations.  No hallucinations, no delusions, not internally preoccupied, oriented x3. Currently future oriented, looking forward to discharge and return to work soon.  She plans to move in with her parents for period time for added family support. She states she slept better last night  ( had  reported insomnia as an ongoing concern since admission) No disruptive or agitated behaviors on unit.  With her expressed consent I spoke with patient's mother.  Mother corroborates that patient seems improved/stable and states she feels that patient is no longer benefiting from inpatient level of care at this time.  She corroborates that plan is for patient to move in with them and explained there is a second home on their property that patient can use if needed. *We have reviewed medications/side effects. She reports history of long-term management with Xanax and Ambien in combination. We reviewed potential for tolerance, dependence, withdrawal associated with benzodiazepines if stopped  abruptly and potential drug interactions between these medications. Advised to discontinue Ambien.   Cognitive Features That Contribute  To Risk:  No gross cognitive deficits noted upon discharge. Is alert , attentive, and oriented x 3    Suicide Risk:  Mild:  Suicidal ideation of limited frequency, intensity, duration, and specificity.  There are no identifiable plans, no associated intent, mild dysphoria and related symptoms, good self-control (both objective and subjective assessment), few other risk factors, and identifiable protective factors, including available and accessible social support.   Follow-up Information    Chucky May, MD. Go on 12/07/2019.   Specialty: Psychiatry Why: You have an appointment for medication management 12/07/19 at 12:45 pm.  This appointment will be held in person (you may change to Virtual if you wish). Contact information: [address] [place] [address] [phone]               Plan Of Care/Follow-up recommendations:  Activity:  As tolerated Diet:  Regular Tests:  NA Other:  See below   Patient is leaving in good spirits, plans to live with her parents for period of time for added family support.  Plans to return to work next week.  Plans to follow-up as above. She states she has Prozac at home , does not need script. She also reports she thinks she has enough Xanax at home to continue her usual dose  until she sees  Dr. Toy Care, but is unsure and will check when she gets home and will contact  Dr. Starleen Arms office  for refill if needed .  *Will prescribe Xanax 1 mgr BID x 3 days , with one refill, to insure she is not at risk of abrupt cessation/WDL. She will also follow-up with her PCP for medical management as needed.  Jenne Campus, MD 11/05/2019, 10:48 AM   Addendum 11/05/2019 at 5,45 PM  Patient called inpatient unit requesting to speak with me- reported she did not have any of her prescribed Xanax left at home, suspects roommate took it .  Stated she was at the pharmacy but pharmacist had to confirm Xanax script with me as her next scheduled prescription  was not yet due. Stated that on Monday she will call Dr. Toy Care, her outpatient psychiatrist,  for refill . At her request I spoke with the pharmacist / acknowledged script for Xanax 1 mgr BID x 3 days .(with the purpose of minimizing risk of BZD WDL while she contacts/obtains refill from her outpatient prescriber.) I again advised patient NOT to take Ambien or any OTC sleeping medication in combination with the above. She expressed understanding.   F Kyana Aicher,MD

## 2019-11-05 NOTE — Progress Notes (Signed)
Patient did not attend morning group because she was asleep.

## 2019-11-05 NOTE — Progress Notes (Signed)
Patient denies SI/HI. Pt received both written and verbal discharge instructions. Pt verbalized understanding of discharge instructions. Pt agreed to f/u appt and med regimen. Pt received an AVS, SRA, transitional record, and prescriptions. Pt gathered belongings from room and locker. Pt safely discharged to the lobby.

## 2019-11-11 ENCOUNTER — Encounter: Payer: Self-pay | Admitting: Medical

## 2019-11-11 ENCOUNTER — Telehealth: Payer: Self-pay | Admitting: Medical

## 2019-11-11 DIAGNOSIS — F341 Dysthymic disorder: Secondary | ICD-10-CM | POA: Diagnosis not present

## 2019-11-11 DIAGNOSIS — F4322 Adjustment disorder with anxiety: Secondary | ICD-10-CM | POA: Diagnosis not present

## 2019-11-11 NOTE — Telephone Encounter (Signed)
Mychart message sent to edward .

## 2019-11-11 NOTE — Telephone Encounter (Signed)
Caller:Angelica Diaz  Call back # [phone]  Patient states she sent a my chart message this morning in regards to yeast infection. Patient would like medicine to be sent to the pharmacy today.

## 2019-11-13 ENCOUNTER — Telehealth: Payer: Self-pay | Admitting: Medical

## 2019-11-13 MED ORDER — FLUCONAZOLE 150 MG PO TABS: 150.0000 mg | ORAL_TABLET | Freq: Once | ORAL | 0 refills | Status: AC

## 2019-11-13 NOTE — Telephone Encounter (Signed)
Rx diflucan tab for yeast infection.

## 2019-11-23 DIAGNOSIS — Z1151 Encounter for screening for human papillomavirus (HPV): Secondary | ICD-10-CM | POA: Diagnosis not present

## 2019-11-23 DIAGNOSIS — Z01419 Encounter for gynecological examination (general) (routine) without abnormal findings: Secondary | ICD-10-CM | POA: Diagnosis not present

## 2019-11-23 DIAGNOSIS — E559 Vitamin D deficiency, unspecified: Secondary | ICD-10-CM | POA: Diagnosis not present

## 2019-11-23 DIAGNOSIS — Z6835 Body mass index (BMI) 35.0-35.9, adult: Secondary | ICD-10-CM | POA: Diagnosis not present

## 2019-11-23 DIAGNOSIS — Z01411 Encounter for gynecological examination (general) (routine) with abnormal findings: Secondary | ICD-10-CM | POA: Diagnosis not present

## 2019-11-23 DIAGNOSIS — R5383 Other fatigue: Secondary | ICD-10-CM | POA: Diagnosis not present

## 2019-11-23 DIAGNOSIS — N920 Excessive and frequent menstruation with regular cycle: Secondary | ICD-10-CM | POA: Diagnosis not present

## 2019-11-23 DIAGNOSIS — Z1231 Encounter for screening mammogram for malignant neoplasm of breast: Secondary | ICD-10-CM | POA: Diagnosis not present

## 2019-11-24 ENCOUNTER — Telehealth: Payer: Self-pay | Admitting: Medical

## 2019-11-24 NOTE — Telephone Encounter (Signed)
Recv'd records from [facility] forwarded 1 page to Dr. Mackie Pai 8/26/21fbg

## 2019-11-28 DIAGNOSIS — E559 Vitamin D deficiency, unspecified: Secondary | ICD-10-CM | POA: Diagnosis not present

## 2019-11-28 DIAGNOSIS — N920 Excessive and frequent menstruation with regular cycle: Secondary | ICD-10-CM | POA: Diagnosis not present

## 2020-02-08 DIAGNOSIS — N920 Excessive and frequent menstruation with regular cycle: Secondary | ICD-10-CM | POA: Diagnosis not present

## 2020-02-16 ENCOUNTER — Other Ambulatory Visit: Payer: Self-pay

## 2020-02-16 ENCOUNTER — Ambulatory Visit (INDEPENDENT_AMBULATORY_CARE_PROVIDER_SITE_OTHER): Payer: BC Managed Care – PPO | Admitting: Medical

## 2020-02-16 VITALS — BP 109/73 | HR 83 | Temp 97.6°F | Resp 16 | Ht 67.5 in | Wt 252.0 lb

## 2020-02-16 DIAGNOSIS — F319 Bipolar disorder, unspecified: Secondary | ICD-10-CM | POA: Diagnosis not present

## 2020-02-16 DIAGNOSIS — E785 Hyperlipidemia, unspecified: Secondary | ICD-10-CM

## 2020-02-16 DIAGNOSIS — F17200 Nicotine dependence, unspecified, uncomplicated: Secondary | ICD-10-CM

## 2020-02-16 DIAGNOSIS — E669 Obesity, unspecified: Secondary | ICD-10-CM

## 2020-02-16 DIAGNOSIS — F419 Anxiety disorder, unspecified: Secondary | ICD-10-CM | POA: Diagnosis not present

## 2020-02-16 DIAGNOSIS — F329 Major depressive disorder, single episode, unspecified: Secondary | ICD-10-CM

## 2020-02-16 MED ORDER — METFORMIN HCL 500 MG PO TABS
500.0000 mg | ORAL_TABLET | Freq: Every day | ORAL | 0 refills | Status: DC
Start: 2020-02-16 — End: 2020-03-09

## 2020-02-16 NOTE — Progress Notes (Addendum)
Subjective:    Patient ID: Angelica Diaz, female    DOB: 11-Aug-1980, 39 y.o.   MRN: [id number]  HPI  Pt has not been seen in year.  Pt has been working from home.  Pt states has gained a lot of weight. She states eats when bored. She admits to eating a lot of sugar. Pt states heaviest she has been in her life. Pt has tried keto diet in past and stopped drinking soda. Over summer thyroid study was normal.  Pt clothes are not fitting. She just joined gym at [employer].   Pt has hx of depression. She is on clonidine, doxepin and buspirone. She states some anxiety as well.   Pt had intentional overdose in summer. She states was very depressed going thru divorce,severe stress at work and just wanted to sleep. Pt was admitted. Sees Dr. Toy Care and has follow up with her on Friday.  Pt does smoke. Pack will last 3 days.       Review of Systems  Constitutional: Negative for chills, fatigue and fever.  Respiratory: Negative for cough, chest tightness, shortness of breath and wheezing.   Cardiovascular: Negative for chest pain and palpitations.  Gastrointestinal: Negative for abdominal pain and blood in stool.  Genitourinary: Negative for dysuria and flank pain.  Musculoskeletal: Negative for back pain.  Skin: Negative for rash.  Neurological: Negative for dizziness, light-headedness and headaches.  Hematological: Negative for adenopathy. Does not bruise/bleed easily.  Psychiatric/Behavioral: Negative for behavioral problems.     Past Medical History:  Diagnosis Date   Anxiety    Arthritis    Asthma    hx of in teens    Depression    Headache(784.0)    Migraine    PONV (postoperative nausea and vomiting)    and irritability after oral surgeries    Sleep disorder      Social History   Socioeconomic History   Marital status: Divorced    Spouse name: Not on file   Number of children: 2   Years of education: Not on file   Highest education level: Not on file   Occupational History   Occupation: Animator: [employer]   Tobacco Use   Smoking status: Current Every Day Smoker    Packs/day: 1.50    Years: 18.00    Pack years: 27.00    Types: Cigarettes   Smokeless tobacco: Never Used   Tobacco comment: declines supplementation  Vaping Use   Vaping Use: Never used  Substance and Sexual Activity   Alcohol use: Yes    Alcohol/week: 0.0 standard drinks    Comment: socially 1x/week, 2-3 drinks   Drug use: No   Sexual activity: Not Currently  Other Topics Concern   Not on file  Social History Narrative   Unemployed.      Applying for disability.      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  She has two healthy children.      Lives in single story home.      Right handed.      Highest level of edu- highschool      Social Determinants of Health   Financial Resource Strain:    Difficulty of Paying Living Expenses: Not on file  Food Insecurity:    Worried About Charity fundraiser in the Last Year: Not on file   [employer] of Food in the Last Year: Not on file  Transportation Needs:    Lack of Transportation (Medical):  Not on file   Lack of Transportation (Non-Medical): Not on file  Physical Activity:    Days of Exercise per Week: Not on file   Minutes of Exercise per Session: Not on file  Stress:    Feeling of Stress : Not on file  Social Connections:    Frequency of Communication with Friends and Family: Not on file   Frequency of Social Gatherings with Friends and Family: Not on file   Attends Religious Services: Not on file   Active Member of Clubs or Organizations: Not on file   Attends Archivist Meetings: Not on file   Marital Status: Not on file  Intimate Partner Violence:    Fear of Current or Ex-Partner: Not on file   Emotionally Abused: Not on file   Physically Abused: Not on file   Sexually Abused: Not on file    Past Surgical History:  Procedure Laterality Date   CESAREAN SECTION      CHOLECYSTECTOMY N/A 05/27/2013   Procedure: LAPAROSCOPIC CHOLECYSTECTOMY WITH INTRAOPERATIVE CHOLANGIOGRAM;  Surgeon: Earnstine Regal, MD;  Location: [facility];  Service: General;  Laterality: N/A;   fallopian tube removal Bilateral 2018   oral surgeries      WISDOM TOOTH EXTRACTION      Family History  Problem Relation Age of Onset   Migraines Mother    Diabetes Maternal Grandfather    Migraines Son    Colon cancer Neg Hx    Throat cancer Neg Hx    Pancreatic cancer Neg Hx    Stomach cancer Neg Hx    Heart disease Neg Hx    Kidney disease Neg Hx    Liver disease Neg Hx     No Known Allergies  Current Outpatient Medications on File Prior to Visit  Medication Sig Dispense Refill   busPIRone (BUSPAR) 30 MG tablet Take 30 mg by mouth 2 (two) times daily.     cloNIDine (CATAPRES) 0.2 MG tablet Take 0.2 mg by mouth at bedtime.     doxepin (SINEQUAN) 75 MG capsule Take 75 mg by mouth.     thiamine 100 MG tablet Take 1 tablet (100 mg total) by mouth daily. 30 tablet 0   No current facility-administered medications on file prior to visit.    BP 109/73 (BP Location: Left Arm, Patient Position: Sitting, Cuff Size: Large)   Pulse 83   Temp 97.6 F (36.4 C) (Oral)   Resp 16   Ht 5' 7.5" (1.715 m)   Wt 252 lb (114.3 kg)   SpO2 100%   BMI 38.89 kg/m       Objective:   Physical Exam  General Mental Status- Alert. General Appearance- Not in acute distress.   Skin General: Color- Normal Color. Moisture- Normal Moisture.  Neck Carotid Arteries- Normal color. Moisture- Normal Moisture. No carotid bruits. No JVD.  Chest and Lung Exam Auscultation: Breath Sounds:-Normal.  Cardiovascular Auscultation:Rythm- Regular. Murmurs & Other Heart Sounds:Auscultation of the heart reveals- No Murmurs.  Abdomen Inspection:-Inspeection Normal. Palpation/Percussion:Note:No mass. Palpation and Percussion of the abdomen reveal- Non Tender, Non Distended + BS, no rebound or  guarding.    Neurologic Cranial Nerve exam:- CN III-XII intact(No nystagmus), symmetric smile. Strength:- 5/5 equal and symmetric strength both upper and lower extremities.      Assessment & Plan:  For obesity counseled and demonstrated how weight watchers app works. Encouraged exercise and rx metformin low dose as this may help with weight loss. If after one month if not losing  weight then can refer to weight management clinic.  For smoking do recommend smoking cessation.  For  depression and anxiety do recommend follow up with psychiatrist. Jim Desanctis to hear you are better since summer hospitalization.  For hyperlipidemia placed cmp and lipid panel future order.  Follow up in one month or as needed.  Mackie Pai, PA-C

## 2020-02-16 NOTE — Patient Instructions (Addendum)
For obesity counseled and demonstrated how weight watchers app works. Encouraged exercise and rx metformin low dose as this may help with weight loss. If after one month if not losing weight then can refer to weight management clinic.  For smoking do recommend smoking cessation.  For  depression and anxiety do recommend follow up with psychiatrist. Jim Desanctis to hear you are better since summer hospitalization.  For hyperlipidemia placed cmp and lipid panel future order.  Follow up in one month or as needed.

## 2020-02-17 DIAGNOSIS — F3342 Major depressive disorder, recurrent, in full remission: Secondary | ICD-10-CM | POA: Diagnosis not present

## 2020-02-17 DIAGNOSIS — F411 Generalized anxiety disorder: Secondary | ICD-10-CM | POA: Diagnosis not present

## 2020-02-17 DIAGNOSIS — G4700 Insomnia, unspecified: Secondary | ICD-10-CM | POA: Diagnosis not present

## 2020-02-21 ENCOUNTER — Other Ambulatory Visit: Payer: BC Managed Care – PPO

## 2020-03-02 DIAGNOSIS — N920 Excessive and frequent menstruation with regular cycle: Secondary | ICD-10-CM | POA: Diagnosis not present

## 2020-03-05 ENCOUNTER — Other Ambulatory Visit: Payer: Self-pay | Admitting: Obstetrics & Gynecology

## 2020-03-09 ENCOUNTER — Other Ambulatory Visit: Payer: Self-pay | Admitting: Medical

## 2020-03-16 ENCOUNTER — Other Ambulatory Visit: Payer: Self-pay

## 2020-03-16 ENCOUNTER — Other Ambulatory Visit (HOSPITAL_COMMUNITY)
Admission: RE | Admit: 2020-03-16 | Discharge: 2020-03-16 | Disposition: A | Discharge status: Home / Self Care | Payer: BC Managed Care – PPO | Source: Ambulatory Visit | Attending: Obstetrics & Gynecology | Admitting: Obstetrics & Gynecology

## 2020-03-16 ENCOUNTER — Encounter (HOSPITAL_BASED_OUTPATIENT_CLINIC_OR_DEPARTMENT_OTHER): Payer: Self-pay | Admitting: Obstetrics & Gynecology

## 2020-03-16 DIAGNOSIS — Z20822 Contact with and (suspected) exposure to covid-19: Secondary | ICD-10-CM | POA: Insufficient documentation

## 2020-03-16 DIAGNOSIS — Z01812 Encounter for preprocedural laboratory examination: Secondary | ICD-10-CM | POA: Insufficient documentation

## 2020-03-16 NOTE — Progress Notes (Signed)
Spoke w/ via phone for pre-op interview--- PT Lab needs dos----  CBC, Urine preg             Lab results------ no COVID test ------ done 03-16-2020 results in epic Arrive at ------- 0700 NPO after MN NO Solid Food.  Clear liquids from MN until--- 0600 Medications to take morning of surgery ----- NONE Diabetic medication ----- n/a Patient Special Instructions ----- n/a Pre-Op special Istructions ----- n/a Patient verbalized understanding of instructions that were given at this phone interview. Patient denies shortness of breath, chest pain, fever, cough at this phone interview.

## 2020-03-17 LAB — SARS CORONAVIRUS 2 (TAT 6-24 HRS): SARS Coronavirus 2: NEGATIVE

## 2020-03-19 NOTE — H&P (Signed)
Angelica Diaz is an 39 y.o. female  [id number]. One C/section. BC- BTL.Has menorrhagia. Failed OCs (didn't tolerated hormones), didn't try Lysteda. . IUD was perforated and needed L/scopy for removal, did BTL by salpingectomy at same surgery.  Pt was offered to try IUD with sono guidance but she cancelled and desires endometrial ablation. Recent Endometrial biopsy De'c21 - benign proliferative endometrium.  Nl Paps, HPV neg- 2019 No breast complaints   Patient's last menstrual period was 03/16/2020 (exact date).    Past Medical History:  Diagnosis Date  . Anxiety   . Arthritis   . Bipolar I disorder ([place])   . Chronic headaches   . Depression   . History of 2019 novel coronavirus disease (COVID-19) 03/10/2019   per pt asymptomatic was exposed ,  results in care everywhere  . History of asthma    as teen  . Insomnia   . 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 patel  (MRI 11-06-2018 in epic)  . Menorrhagia   . PONV (postoperative nausea and vomiting)    and irritability after oral surgeries   . Wears contact lenses     Past Surgical History:  Procedure Laterality Date  . CHOLECYSTECTOMY N/A 05/27/2013   Procedure: LAPAROSCOPIC CHOLECYSTECTOMY WITH INTRAOPERATIVE CHOLANGIOGRAM;  Surgeon: Earnstine Regal, MD;  Location: [facility];  Service: General;  Laterality: N/A;  . DILATION AND CURETTAGE OF UTERUS  yrs ago  . LAPAROSCOPIC BILATERAL SALPINGECTOMY  2018  . WISDOM TOOTH EXTRACTION      Family History  Problem Relation Age of Onset  . Migraines Mother   . Diabetes Maternal Grandfather   . Migraines Son   . Colon cancer Neg Hx   . Throat cancer Neg Hx   . Pancreatic cancer Neg Hx   . Stomach cancer Neg Hx   . Heart disease Neg Hx   . Kidney disease Neg Hx   . Liver disease Neg Hx     Social History:  reports that she has been smoking cigarettes. She has smoked for the past 8.00 years. She has never used smokeless tobacco. She reports  current alcohol use. She reports that she does not use drugs.  Allergies: No Known Allergies  No medications prior to admission.    Review of Systems neg   Height 5' 7.5" (1.715 m), weight 113.4 kg, last menstrual period 03/16/2020. Physical Exam  BP (!) 144/100   Pulse 89   Temp 97.9 F (36.6 C) (Oral)   Resp 18   Ht 5\' 7"  (1.702 m)   Wt 115.5 kg   LMP 03/16/2020 (Exact Date)   SpO2 100%   BMI 39.88 kg/m   A&O x 3, no acute distress. Pleasant HEENT neg, no thyromegaly Lungs CTA bilat CV RRR  S1S2 normal Abdo soft, non tender, non acute Extr no edema/ tenderness Pelvic Uterus AV, nl size, nl cervix, nl adnexa   Assessment/Plan: 39 to, female here for Hysteroscopy and Novasure endometrial ablation  Risks/complications of surgery reviewed incl infection, bleeding, damage to internal organs including bladder, bowels, ureters, blood vessels, other risks from anesthesia, VTE and delayed complications of any surgery, complications in future surgery reviewed. Risk of return of menses and then may needing hysterectomy reviewed.    Elveria Royals MD

## 2020-03-20 ENCOUNTER — Ambulatory Visit (HOSPITAL_BASED_OUTPATIENT_CLINIC_OR_DEPARTMENT_OTHER)
Admission: RE | Admit: 2020-03-20 | Discharge: 2020-03-20 | Disposition: A | Discharge status: Home / Self Care | Payer: BC Managed Care – PPO | Attending: Obstetrics & Gynecology | Admitting: Obstetrics & Gynecology

## 2020-03-20 ENCOUNTER — Ambulatory Visit (HOSPITAL_BASED_OUTPATIENT_CLINIC_OR_DEPARTMENT_OTHER): Payer: BC Managed Care – PPO | Admitting: Anesthesiology

## 2020-03-20 ENCOUNTER — Encounter (HOSPITAL_BASED_OUTPATIENT_CLINIC_OR_DEPARTMENT_OTHER)
Admission: RE | Disposition: A | Discharge status: Home / Self Care | Payer: Self-pay | Source: Home / Self Care | Attending: Obstetrics & Gynecology

## 2020-03-20 ENCOUNTER — Other Ambulatory Visit: Payer: Self-pay

## 2020-03-20 ENCOUNTER — Encounter (HOSPITAL_BASED_OUTPATIENT_CLINIC_OR_DEPARTMENT_OTHER): Payer: Self-pay | Admitting: Obstetrics & Gynecology

## 2020-03-20 DIAGNOSIS — D329 Benign neoplasm of meninges, unspecified: Secondary | ICD-10-CM | POA: Diagnosis not present

## 2020-03-20 DIAGNOSIS — F17200 Nicotine dependence, unspecified, uncomplicated: Secondary | ICD-10-CM | POA: Diagnosis not present

## 2020-03-20 DIAGNOSIS — Z8616 Personal history of COVID-19: Secondary | ICD-10-CM | POA: Insufficient documentation

## 2020-03-20 DIAGNOSIS — N920 Excessive and frequent menstruation with regular cycle: Secondary | ICD-10-CM | POA: Insufficient documentation

## 2020-03-20 DIAGNOSIS — J45909 Unspecified asthma, uncomplicated: Secondary | ICD-10-CM | POA: Diagnosis not present

## 2020-03-20 DIAGNOSIS — G4700 Insomnia, unspecified: Secondary | ICD-10-CM | POA: Diagnosis not present

## 2020-03-20 HISTORY — DX: Personal history of other diseases of the respiratory system: Z87.09

## 2020-03-20 HISTORY — DX: Other chronic pain: G89.29

## 2020-03-20 HISTORY — DX: Presence of spectacles and contact lenses: Z97.3

## 2020-03-20 HISTORY — DX: Benign neoplasm of cerebral meninges: D32.0

## 2020-03-20 HISTORY — DX: Excessive and frequent menstruation with regular cycle: N92.0

## 2020-03-20 HISTORY — PX: DILITATION & CURRETTAGE/HYSTROSCOPY WITH NOVASURE ABLATION: SHX5568

## 2020-03-20 HISTORY — DX: Headache, unspecified: R51.9

## 2020-03-20 HISTORY — DX: Insomnia, unspecified: G47.00

## 2020-03-20 LAB — CBC
HCT: 44.1 % (ref 36.0–46.0)
Hemoglobin: 14.7 g/dL (ref 12.0–15.0)
MCH: 32.1 pg (ref 26.0–34.0)
MCHC: 33.3 g/dL (ref 30.0–36.0)
MCV: 96.3 fL (ref 80.0–100.0)
Platelets: 250 10*3/uL (ref 150–400)
RBC: 4.58 MIL/uL (ref 3.87–5.11)
RDW: 11.8 % (ref 11.5–15.5)
WBC: 6.8 10*3/uL (ref 4.0–10.5)
nRBC: 0 % (ref 0.0–0.2)

## 2020-03-20 LAB — POCT PREGNANCY, URINE: Preg Test, Ur: NEGATIVE

## 2020-03-20 SURGERY — DILATATION & CURETTAGE/HYSTEROSCOPY WITH NOVASURE ABLATION
Anesthesia: General | Site: Uterus

## 2020-03-20 MED ORDER — MEPERIDINE HCL 25 MG/ML IJ SOLN
6.2500 mg | INTRAMUSCULAR | Status: DC | PRN
Start: 2020-03-20 — End: 2020-03-20

## 2020-03-20 MED ORDER — PROPOFOL 10 MG/ML IV BOLUS
INTRAVENOUS | Status: AC
  Filled 2020-03-20: qty 20

## 2020-03-20 MED ORDER — FENTANYL CITRATE (PF) 100 MCG/2ML IJ SOLN
INTRAMUSCULAR | Status: DC | PRN
  Administered 2020-03-20 (×2): 50 ug via INTRAVENOUS

## 2020-03-20 MED ORDER — MIDAZOLAM HCL 2 MG/2ML IJ SOLN
INTRAMUSCULAR | Status: AC
  Filled 2020-03-20: qty 2

## 2020-03-20 MED ORDER — OXYCODONE HCL 5 MG PO TABS: 5.0000 mg | ORAL_TABLET | Freq: Once | ORAL | Status: DC | PRN

## 2020-03-20 MED ORDER — ONDANSETRON HCL 4 MG/2ML IJ SOLN
INTRAMUSCULAR | Status: DC | PRN
  Administered 2020-03-20: 4 mg via INTRAVENOUS

## 2020-03-20 MED ORDER — LIDOCAINE HCL (PF) 2 % IJ SOLN
INTRAMUSCULAR | Status: AC
  Filled 2020-03-20: qty 5

## 2020-03-20 MED ORDER — OXYCODONE HCL 5 MG/5ML PO SOLN
5.0000 mg | Freq: Once | ORAL | Status: DC | PRN
Start: 2020-03-20 — End: 2020-03-20

## 2020-03-20 MED ORDER — LIDOCAINE 2% (20 MG/ML) 5 ML SYRINGE
INTRAMUSCULAR | Status: DC | PRN
  Administered 2020-03-20: 50 mg via INTRAVENOUS

## 2020-03-20 MED ORDER — LACTATED RINGERS IV SOLN: INTRAVENOUS | Status: DC

## 2020-03-20 MED ORDER — PROPOFOL 10 MG/ML IV BOLUS
INTRAVENOUS | Status: DC | PRN
  Administered 2020-03-20: 100 mg via INTRAVENOUS
  Administered 2020-03-20: 200 mg via INTRAVENOUS

## 2020-03-20 MED ORDER — DEXAMETHASONE SODIUM PHOSPHATE 10 MG/ML IJ SOLN
INTRAMUSCULAR | Status: DC | PRN
  Administered 2020-03-20: 10 mg via INTRAVENOUS

## 2020-03-20 MED ORDER — DEXAMETHASONE SODIUM PHOSPHATE 10 MG/ML IJ SOLN
INTRAMUSCULAR | Status: AC
  Filled 2020-03-20: qty 1

## 2020-03-20 MED ORDER — FENTANYL CITRATE (PF) 100 MCG/2ML IJ SOLN
INTRAMUSCULAR | Status: AC
  Filled 2020-03-20: qty 2

## 2020-03-20 MED ORDER — SODIUM CHLORIDE 0.9 % IR SOLN
Status: DC | PRN
  Administered 2020-03-20: 3000 mL

## 2020-03-20 MED ORDER — KETOROLAC TROMETHAMINE 30 MG/ML IJ SOLN
INTRAMUSCULAR | Status: DC | PRN
  Administered 2020-03-20: 30 mg via INTRAVENOUS

## 2020-03-20 MED ORDER — FENTANYL CITRATE (PF) 100 MCG/2ML IJ SOLN: 25.0000 ug | INTRAMUSCULAR | Status: DC | PRN

## 2020-03-20 MED ORDER — DEXMEDETOMIDINE (PRECEDEX) IN NS 20 MCG/5ML (4 MCG/ML) IV SYRINGE
PREFILLED_SYRINGE | INTRAVENOUS | Status: DC | PRN
  Administered 2020-03-20 (×4): 4 ug via INTRAVENOUS

## 2020-03-20 MED ORDER — KETOROLAC TROMETHAMINE 30 MG/ML IJ SOLN
INTRAMUSCULAR | Status: AC
  Filled 2020-03-20: qty 1

## 2020-03-20 MED ORDER — LIDOCAINE HCL 1 % IJ SOLN
INTRAMUSCULAR | Status: DC | PRN
  Administered 2020-03-20: 10 mL

## 2020-03-20 MED ORDER — ACETAMINOPHEN 10 MG/ML IV SOLN: 1000.0000 mg | Freq: Once | INTRAVENOUS | Status: DC | PRN

## 2020-03-20 MED ORDER — POVIDONE-IODINE 10 % EX SWAB: 2.0000 "application " | Freq: Once | CUTANEOUS | Status: DC

## 2020-03-20 MED ORDER — MIDAZOLAM HCL 2 MG/2ML IJ SOLN
INTRAMUSCULAR | Status: DC | PRN
  Administered 2020-03-20: 2 mg via INTRAVENOUS

## 2020-03-20 MED ORDER — ACETAMINOPHEN 325 MG PO TABS: 325.0000 mg | ORAL_TABLET | Freq: Once | ORAL | Status: DC | PRN

## 2020-03-20 MED ORDER — AMISULPRIDE (ANTIEMETIC) 5 MG/2ML IV SOLN: 10.0000 mg | Freq: Once | INTRAVENOUS | Status: DC | PRN

## 2020-03-20 MED ORDER — ONDANSETRON HCL 4 MG/2ML IJ SOLN
INTRAMUSCULAR | Status: AC
  Filled 2020-03-20: qty 2

## 2020-03-20 MED ORDER — DEXMEDETOMIDINE (PRECEDEX) IN NS 20 MCG/5ML (4 MCG/ML) IV SYRINGE
PREFILLED_SYRINGE | INTRAVENOUS | Status: AC
  Filled 2020-03-20: qty 5

## 2020-03-20 MED ORDER — ACETAMINOPHEN 160 MG/5ML PO SOLN: 325.0000 mg | Freq: Once | ORAL | Status: DC | PRN

## 2020-03-20 SURGICAL SUPPLY — 14 items
ABLATOR SURESOUND NOVASURE (ABLATOR) ×6 IMPLANT
BIPOLAR CUTTING LOOP 21FR (ELECTRODE)
GLOVE BIO SURGEON STRL SZ7 (GLOVE) ×3 IMPLANT
GLOVE BIOGEL PI IND STRL 7.0 (GLOVE) ×2 IMPLANT
GLOVE BIOGEL PI INDICATOR 7.0 (GLOVE) ×4
GOWN STRL REUS W/ TWL LRG LVL3 (GOWN DISPOSABLE) ×2 IMPLANT
GOWN STRL REUS W/TWL LRG LVL3 (GOWN DISPOSABLE) ×6
IV NS IRRIG 3000ML ARTHROMATIC (IV SOLUTION) ×3 IMPLANT
KIT PROCEDURE FLUENT (KITS) ×6 IMPLANT
KIT TURNOVER CYSTO (KITS) ×3 IMPLANT
LOOP CUTTING BIPOLAR 21FR (ELECTRODE) IMPLANT
PACK VAGINAL MINOR WOMEN LF (CUSTOM PROCEDURE TRAY) ×3 IMPLANT
PAD OB MATERNITY 4.3X12.25 (PERSONAL CARE ITEMS) ×3 IMPLANT
TOWEL OR 17X26 10 PK STRL BLUE (TOWEL DISPOSABLE) ×6 IMPLANT

## 2020-03-20 NOTE — Transfer of Care (Signed)
Immediate Anesthesia Transfer of Care Note  Patient: Angelica Diaz  Procedure(s) Performed: Procedure(s) (LRB): DILATATION & CURETTAGE/HYSTEROSCOPY WITH NOVASURE ABLATION (N/A)  Patient Location: PACU  Anesthesia Type: General  Level of Consciousness: awake, alert  and oriented  Airway & Oxygen Therapy: Patient Spontanous Breathing and Patient connected to face mask oxygen  Post-op Assessment: Report given to PACU RN and Post -op Vital signs reviewed and stable  Post vital signs: Reviewed and stable  Complications: No apparent anesthesia complications Last Vitals:  Vitals Value Taken Time  BP 133/92 03/20/20 1009  Temp 36.3 C 03/20/20 1009  Pulse 74 03/20/20 1012  Resp 18 03/20/20 1012  SpO2 99 % 03/20/20 1012  Vitals shown include unvalidated device data.  Last Pain:  Vitals:   03/20/20 0729  TempSrc: Oral  PainSc: 0-No pain      Patients Stated Pain Goal: 5 (54/56/25 6389)  Complications: No complications documented.

## 2020-03-20 NOTE — Op Note (Signed)
Preoperative diagnosis: Menorrhagia. Failed medical therapy and IUD  Postop diagnosis: SAME  Procedure: Diagnostic Hysteroscopy, Novasure endometrial ablation  Anesthesia General via LMA and 10 cc 1% Paracervical block  Surgeon: Azucena Fallen, MD  Assistant:N/A IV fluids: 1200 cc LR Estimated blood loss: 5 cc  Urine output: straight catheter preop 50 cc Complications none  Condition stable  Disposition PACU  Specimen: None (office EBX normal)   Procedure  Indication: Menorrhagia, failed OCs/ poor tolerance. IUD perforation history so declined trying again. Here for endometrial ablation. Office endometrial biopsy path was normal, no hyperplasia or cancer. Office sono noted normal uterus. Cervical os stenotic, so she was advised vaginal Misoprostol but could place it right.  Patient was counseled on risks/ complications including infection, bleeding, damage to internal organs, she understood and agrees, gave informed written consent.  Patient was brought to the operating room with IV running. Time out was carried out. Antibiotic was deferred.  She underwent general anesthesia via LMA without complications. She was given dorsolithotomy position. Parts were prepped and draped in standard fashion. Bladder was catheterized once. Bimanual exam revealed uterus to be anteverted and normal size. Speculum was placed and cervix was grasped with single-tooth tenaculum, minimal descent/ mobility noted (H/o C/section). Cervical block with 10 cc 1% plain Xylocaine given. The uterus was sounded to 8 cm. Cervical os was dilated to 22 [place] dilator. Hysteroscope was introduced in the uterine cavity under vision, using saline for irrigation. Findings: noted arcuate uterine cavity, normal tubal ostii and no polyps/ fibroids.  Hysteroscope removed and Novasure advance after doing cavity measurements. Cavity length 4 cm. Width just at 2.5 cm and decision was made to proceed with Novasure ablation. Power at 69 W and  ablation time was 34 seconds.  Novasure removed and Hysteroscopy inserted, cavity noted to have adequate ablation.  All instruments removed. Counts are correct x2. No complications. She was brought to the recovery room in stable condition.   Patient will be discharged home today. Warning instructions given. F/up 4 weeks in office,  I performed the surgery. Novasure Rep Lilia Pro attended the case.  V.Tylasia Fletchall, MD.

## 2020-03-20 NOTE — Anesthesia Preprocedure Evaluation (Signed)
Anesthesia Evaluation  Patient identified by MRN, date of birth, ID band Patient awake    Reviewed: Allergy & Precautions, NPO status , Patient's Chart, lab work & pertinent test results  Airway Mallampati: II  TM Distance: >3 FB Neck ROM: Full    Dental  (+) Dental Advisory Given, Upper Dentures, Partial Lower   Pulmonary asthma , Current Smoker,    breath sounds clear to auscultation       Cardiovascular negative cardio ROS   Rhythm:Regular Rate:Normal     Neuro/Psych  Headaches, PSYCHIATRIC DISORDERS Anxiety Depression Bipolar Disorder    GI/Hepatic negative GI ROS, Neg liver ROS,   Endo/Other  negative endocrine ROS  Renal/GU negative Renal ROS     Musculoskeletal  (+) Arthritis ,   Abdominal Normal abdominal exam  (+)   Peds  Hematology negative hematology ROS (+)   Anesthesia Other Findings   Reproductive/Obstetrics                             Anesthesia Physical Anesthesia Plan  ASA: II  Anesthesia Plan: General   Post-op Pain Management:    Induction: Intravenous  PONV Risk Score and Plan: 3 and Ondansetron, Dexamethasone and Midazolam  Airway Management Planned: LMA  Additional Equipment: None  Intra-op Plan:   Post-operative Plan: Extubation in OR  Informed Consent: I have reviewed the patients History and Physical, chart, labs and discussed the procedure including the risks, benefits and alternatives for the proposed anesthesia with the patient or authorized representative who has indicated his/her understanding and acceptance.     Dental advisory given  Plan Discussed with: CRNA  Anesthesia Plan Comments:         Anesthesia Quick Evaluation

## 2020-03-20 NOTE — Anesthesia Postprocedure Evaluation (Signed)
Anesthesia Post Note  Patient: Angelica Diaz  Procedure(s) Performed: DILATATION & CURETTAGE/HYSTEROSCOPY WITH NOVASURE ABLATION (N/A Uterus)     Patient location during evaluation: PACU Anesthesia Type: General Level of consciousness: awake and alert Pain management: pain level controlled Vital Signs Assessment: post-procedure vital signs reviewed and stable Respiratory status: spontaneous breathing, nonlabored ventilation, respiratory function stable and patient connected to nasal cannula oxygen Cardiovascular status: blood pressure returned to baseline and stable Postop Assessment: no apparent nausea or vomiting Anesthetic complications: no   No complications documented.  Last Vitals:  Vitals:   03/20/20 1045 03/20/20 1108  BP: (!) 133/97 (!) 150/98  Pulse: 64 61  Resp: (!) 25 16  Temp:  36.6 C  SpO2: 98% 99%    Last Pain:  Vitals:   03/20/20 1108  TempSrc:   PainSc: [name]

## 2020-03-20 NOTE — Anesthesia Procedure Notes (Signed)
Procedure Name: LMA Insertion Date/Time: 03/20/2020 9:20 AM Performed by: Mechele Claude, CRNA Pre-anesthesia Checklist: Patient identified, Emergency Drugs available, Suction available and Patient being monitored Patient Re-evaluated:Patient Re-evaluated prior to induction Oxygen Delivery Method: Circle system utilized Preoxygenation: Pre-oxygenation with 100% oxygen Induction Type: IV induction Ventilation: Mask ventilation without difficulty LMA: LMA inserted LMA Size: 4.0 Number of attempts: 1 Airway Equipment and Method: Bite block Placement Confirmation: positive ETCO2 Tube secured with: Tape Dental Injury: Teeth and Oropharynx as per pre-operative assessment

## 2020-03-20 NOTE — Discharge Instructions (Signed)
  Post Anesthesia Home Care Instructions  Activity: Get plenty of rest for the remainder of the day. A responsible individual must stay with you for 24 hours following the procedure.  For the next 24 hours, DO NOT: -Drive a car -Paediatric nurse -Drink alcoholic beverages -Take any medication unless instructed by your physician -Make any legal decisions or sign important papers.  Meals: Start with liquid foods such as gelatin or soup. Progress to regular foods as tolerated. Avoid greasy, spicy, heavy foods. If nausea and/or vomiting occur, drink only clear liquids until the nausea and/or vomiting subsides. Call your physician if vomiting continues.  Special Instructions/Symptoms: Your throat may feel dry or sore from the anesthesia or the breathing tube placed in your throat during surgery. If this causes discomfort, gargle with warm salt water. The discomfort should disappear within 24 hours.  DISCHARGE INSTRUCTIONS: HYSTEROSCOPY / ENDOMETRIAL ABLATION The following instructions have been prepared to help you care for yourself upon your return home.  May take Ibuprofen after 3:30 PM  May take stool softner while taking narcotic pain medication to prevent constipation.  Drink plenty of water.  Personal hygiene: Marland Kitchen Use sanitary pads for vaginal drainage, not tampons. . Shower the day after your procedure. . NO tub baths, pools or Jacuzzis for 2-3 weeks. . Wipe front to back after using the bathroom.  Activity and limitations: . Do NOT drive or operate any equipment for 24 hours. The effects of anesthesia are still present and drowsiness may result. . Do NOT rest in bed all day. . Walking is encouraged. . Walk up and down stairs slowly. . You may resume your normal activity in one to two days or as indicated by your physician. Sexual activity: NO intercourse for at least 2 weeks after the procedure, or as indicated by your Doctor.  Diet: Eat a light meal as desired this evening.  You may resume your usual diet tomorrow.  Return to Work: You may resume your work activities in one to two days or as indicated by Marine scientist.  What to expect after your surgery: Expect to have vaginal bleeding/discharge for 2-3 days and spotting for up to 10 days. It is not unusual to have soreness for up to 1-2 weeks. You may have a slight burning sensation when you urinate for the first day. Mild cramps may continue for a couple of days. You may have a regular period in 2-6 weeks.  Call your doctor for any of the following: . Excessive vaginal bleeding or clotting, saturating and changing one pad every hour. . Inability to urinate 6 hours after discharge from hospital. . Pain not relieved by pain medication. . Fever of 100.4 F or greater. . Unusual vaginal discharge or odor.

## 2020-03-21 ENCOUNTER — Encounter (HOSPITAL_BASED_OUTPATIENT_CLINIC_OR_DEPARTMENT_OTHER): Payer: Self-pay | Admitting: Obstetrics & Gynecology

## 2020-03-31 ENCOUNTER — Other Ambulatory Visit: Payer: Self-pay | Admitting: Medical

## 2020-04-23 DIAGNOSIS — N945 Secondary dysmenorrhea: Secondary | ICD-10-CM | POA: Diagnosis not present

## 2020-04-23 DIAGNOSIS — N939 Abnormal uterine and vaginal bleeding, unspecified: Secondary | ICD-10-CM | POA: Diagnosis not present

## 2020-04-27 DIAGNOSIS — N9985 Post endometrial ablation syndrome: Secondary | ICD-10-CM | POA: Diagnosis not present

## 2020-04-27 DIAGNOSIS — N920 Excessive and frequent menstruation with regular cycle: Secondary | ICD-10-CM | POA: Diagnosis not present

## 2020-05-05 ENCOUNTER — Other Ambulatory Visit: Payer: Self-pay | Admitting: Medical

## 2020-05-07 DIAGNOSIS — G4700 Insomnia, unspecified: Secondary | ICD-10-CM | POA: Diagnosis not present

## 2020-05-07 DIAGNOSIS — F3342 Major depressive disorder, recurrent, in full remission: Secondary | ICD-10-CM | POA: Diagnosis not present

## 2020-05-07 DIAGNOSIS — F411 Generalized anxiety disorder: Secondary | ICD-10-CM | POA: Diagnosis not present

## 2020-05-15 DIAGNOSIS — N946 Dysmenorrhea, unspecified: Secondary | ICD-10-CM | POA: Diagnosis not present

## 2020-05-15 DIAGNOSIS — N939 Abnormal uterine and vaginal bleeding, unspecified: Secondary | ICD-10-CM | POA: Diagnosis not present

## 2020-05-15 DIAGNOSIS — N9985 Post endometrial ablation syndrome: Secondary | ICD-10-CM | POA: Diagnosis not present

## 2020-05-15 DIAGNOSIS — N920 Excessive and frequent menstruation with regular cycle: Secondary | ICD-10-CM | POA: Diagnosis not present

## 2020-05-19 ENCOUNTER — Other Ambulatory Visit: Payer: Self-pay | Admitting: Medical

## 2020-05-22 ENCOUNTER — Other Ambulatory Visit: Payer: Self-pay | Admitting: Obstetrics & Gynecology

## 2020-06-05 ENCOUNTER — Other Ambulatory Visit: Payer: Self-pay

## 2020-06-05 ENCOUNTER — Encounter (HOSPITAL_BASED_OUTPATIENT_CLINIC_OR_DEPARTMENT_OTHER): Payer: Self-pay | Admitting: Obstetrics & Gynecology

## 2020-06-05 NOTE — Progress Notes (Signed)
YOU ARE SCHEDULED FOR A COVID TEST 06-11-2020 @ 230 PM THIS TEST MUST BE DONE BEFORE SURGERY. GO TO  [facility]. [place], [place], IT IS APPROXIMATELY 2 MINUTES PAST [employer] ON THE RIGHT AND REMAIN IN YOUR CAR, THIS IS A DRIVE UP TEST. ONCE YOUR COVID TEST IS DONE PLEASE FOLLOW ALL THE QUARANTINE  INSTRUCTIONS GIVEN IN YOUR HANDOUT.      Your procedure is scheduled on 06-13-2020  Report to Tina M.   Call this number if you have problems the morning of surgery  :[phone].   OUR ADDRESS IS [address].  WE ARE LOCATED IN [facility].  PLEASE BRING YOUR INSURANCE CARD AND PHOTO ID DAY OF SURGERY.  ONLY ONE PERSON ALLOWED IN FACILITY WAITING AREA.                                     REMEMBER:  DO NOT EAT FOOD, CANDY GUM OR MINTS  AFTER MIDNIGHT . YOU MAY HAVE CLEAR LIQUIDS FROM MIDNIGHT UNTIL 1030  AM. NO CLEAR LIQUIDS AFTER 1030 AM DAY OF SURGERY.   YOU MAY  BRUSH YOUR TEETH MORNING OF SURGERY AND RINSE YOUR MOUTH OUT, NO CHEWING GUM CANDY OR MINTS.    CLEAR LIQUID DIET   Foods Allowed                                                                     Foods Excluded  Coffee and tea, regular and decaf                             liquids that you cannot  Plain Jell-O any favor except red or purple                                           see through such as: Fruit ices (not with fruit pulp)                                     milk, soups, orange juice  Iced Popsicles                                    All solid food Carbonated beverages, regular and diet                                    Cranberry, grape and apple juices Sports drinks like Gatorade Lightly seasoned clear broth or consume(fat free) Sugar, honey syrup  Sample Menu Breakfast                                Lunch  Supper Cranberry juice                    Beef broth                            Chicken  broth Jell-O                                     Grape juice                           Apple juice Coffee or tea                        Jell-O                                      Popsicle                                                Coffee or tea                        Coffee or tea  _____________________________________________________________________     TAKE THESE MEDICATIONS MORNING OF SURGERY WITH A SIP OF WATER: NONE  ONE VISITOR IS ALLOWED IN WAITING ROOM ONLY DAY OF SURGERY.  NO VISITOR MAY SPEND THE NIGHT.  VISITOR ARE ALLOWED TO STAY UNTIL 800 PM.                                    DO NOT WEAR JEWERLY, MAKE UP,  DO NOT WEAR LOTIONS, POWDERS, PERFUMES OR DEODORANT. DO NOT SHAVE FOR 24 HOURS PRIOR TO DAY OF SURGERY. MEN MAY SHAVE FACE AND NECK. CONTACTS, GLASSES, OR DENTURES MAY NOT BE WORN TO SURGERY.                                    [facility] IS NOT RESPONSIBLE  FOR ANY BELONGINGS.                                                                    Marland Kitchen           [facility] - Preparing for Surgery Before surgery, you can play an important role.  Because skin is not sterile, your skin needs to be as free of germs as possible.  You can reduce the number of germs on your skin by washing with CHG (chlorahexidine gluconate) soap before surgery.  CHG is an antiseptic cleaner which kills germs and bonds with the skin to continue killing germs even after washing. Please DO NOT use if you have an allergy to CHG or antibacterial soaps.  If your skin becomes reddened/irritated stop  using the CHG and inform your nurse when you arrive at Short Stay. Do not shave (including legs and underarms) for at least 48 hours prior to the first CHG shower.  You may shave your face/neck. Please follow these instructions carefully:  1.  Shower with CHG Soap the night before surgery and the  morning of Surgery.  2.  If you choose to wash your hair, wash your hair first as usual with your  normal   shampoo.  3.  After you shampoo, rinse your hair and body thoroughly to remove the  shampoo.                            4.  Use CHG as you would any other liquid soap.  You can apply chg directly  to the skin and wash                      Gently with a scrungie or clean washcloth.  5.  Apply the CHG Soap to your body ONLY FROM THE NECK DOWN.   Do not use on face/ open                           Wound or open sores. Avoid contact with eyes, ears mouth and genitals (private parts).                       Wash face,  Genitals (private parts) with your normal soap.             6.  Wash thoroughly, paying special attention to the area where your surgery  will be performed.  7.  Thoroughly rinse your body with warm water from the neck down.  8.  DO NOT shower/wash with your normal soap after using and rinsing off  the CHG Soap.                9.  Pat yourself dry with a clean towel.            10.  Wear clean pajamas.            11.  Place clean sheets on your bed the night of your first shower and do not  sleep with pets. Day of Surgery : Do not apply any lotions/deodorants the morning of surgery.  Please wear clean clothes to the hospital/surgery center.  FAILURE TO FOLLOW THESE INSTRUCTIONS MAY RESULT IN THE CANCELLATION OF YOUR SURGERY PATIENT SIGNATURE_________________________________  NURSE SIGNATURE__________________________________  ________________________________________________________________________                                                        QUESTIONS Hansel Feinstein PRE OP NURSE PHONE [phone]

## 2020-06-05 NOTE — Progress Notes (Addendum)
Spoke w/ via phone for pre-op interview---pt Lab needs dos----    Urine preg           Lab results------lab appt 04-13-2020 1300 for cbc bmp t & s  COVID test ------06-11-2020 1430 Arrive at -------1130 am 06-13-2020 NPO after MN NO Solid Food.  Clear liquids from MN until---1030 am then npo Medications to take morning of surgery ---none-- Diabetic medication -----n/a Patient Special Instructions -----PT GIVEN EXTENDED STAY INSTRUCTIONS Pre-Op special Istructions -----none Patient verbalized understanding of instructions that were given at this phone interview. Patient denies shortness of breath, chest pain, fever, cough at this phone interview.

## 2020-06-11 ENCOUNTER — Encounter (HOSPITAL_COMMUNITY)
Admission: RE | Admit: 2020-06-11 | Discharge: 2020-06-11 | Disposition: A | Discharge status: Home / Self Care | Payer: BC Managed Care – PPO | Source: Ambulatory Visit | Attending: Obstetrics & Gynecology | Admitting: Obstetrics & Gynecology

## 2020-06-11 ENCOUNTER — Other Ambulatory Visit (HOSPITAL_COMMUNITY)
Admission: RE | Admit: 2020-06-11 | Discharge: 2020-06-11 | Disposition: A | Discharge status: Home / Self Care | Payer: BC Managed Care – PPO | Source: Ambulatory Visit | Attending: Obstetrics & Gynecology | Admitting: Obstetrics & Gynecology

## 2020-06-11 ENCOUNTER — Other Ambulatory Visit: Payer: Self-pay

## 2020-06-11 DIAGNOSIS — Z20822 Contact with and (suspected) exposure to covid-19: Secondary | ICD-10-CM | POA: Insufficient documentation

## 2020-06-11 DIAGNOSIS — Z01812 Encounter for preprocedural laboratory examination: Secondary | ICD-10-CM | POA: Insufficient documentation

## 2020-06-11 LAB — CBC
HCT: 44.3 % (ref 36.0–46.0)
Hemoglobin: 14.5 g/dL (ref 12.0–15.0)
MCH: 31.5 pg (ref 26.0–34.0)
MCHC: 32.7 g/dL (ref 30.0–36.0)
MCV: 96.3 fL (ref 80.0–100.0)
Platelets: 261 10*3/uL (ref 150–400)
RBC: 4.6 MIL/uL (ref 3.87–5.11)
RDW: 12 % (ref 11.5–15.5)
WBC: 7.8 10*3/uL (ref 4.0–10.5)
nRBC: 0 % (ref 0.0–0.2)

## 2020-06-11 LAB — BASIC METABOLIC PANEL
Anion gap: 9 (ref 5–15)
BUN: 22 mg/dL — ABNORMAL HIGH (ref 6–20)
CO2: 22 mmol/L (ref 22–32)
Calcium: 9.3 mg/dL (ref 8.9–10.3)
Chloride: 105 mmol/L (ref 98–111)
Creatinine, Ser: 0.83 mg/dL (ref 0.44–1.00)
GFR, Estimated: >60 mL/min (ref >60)
Glucose, Bld: 97 mg/dL (ref 70–99)
Potassium: 4.6 mmol/L (ref 3.5–5.1)
Sodium: 136 mmol/L (ref 135–145)

## 2020-06-12 LAB — SARS CORONAVIRUS 2 (TAT 6-24 HRS): SARS Coronavirus 2: NEGATIVE

## 2020-06-12 NOTE — H&P (Addendum)
Angelica Diaz is an 40 y.o. female [phone] with menorrhagia and dysmenorrhea, failed endometrial ablation that was done 3 months back with pain getting worse, desires hysterectomy.  H/o endometrial ablation and Lap TL Dec'21.  Nl Paps. No breast issues.  2 kids, one was C/section, BC TL.    Past Medical History:  Diagnosis Date  . Anxiety   . Arthritis   . Chronic headaches   . Depression   . History of 2019 novel coronavirus disease (COVID-19) 03/10/2019   per pt asymptomatic was exposed ,  results in care everywhere  . History of asthma    as teen  . Insomnia   . 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 patel  (MRI 11-06-2018 in epic)  . Menorrhagia   . Migraine   . Wears contact lenses     Past Surgical History:  Procedure Laterality Date  . CESAREAN SECTION  1999  . CHOLECYSTECTOMY N/A 05/27/2013   Procedure: LAPAROSCOPIC CHOLECYSTECTOMY WITH INTRAOPERATIVE CHOLANGIOGRAM;  Surgeon: Earnstine Regal, MD;  Location: [facility];  Service: General;  Laterality: N/A;  . dental implants  2018   upper  . DILATION AND CURETTAGE OF UTERUS  yrs ago  . DILITATION & CURRETTAGE/HYSTROSCOPY WITH NOVASURE ABLATION N/A 03/20/2020   Procedure: DILATATION & CURETTAGE/HYSTEROSCOPY WITH NOVASURE ABLATION;  Surgeon: Azucena Fallen, MD;  Location: [facility];  Service: Gynecology;  Laterality: N/A;  . fallopian tubes removed  2018  . LAPAROSCOPIC BILATERAL SALPINGECTOMY  2018  . WISDOM TOOTH EXTRACTION  yrs ago    Family History  Problem Relation Age of Onset  . Migraines Mother   . Diabetes Maternal Grandfather   . Migraines Son   . Colon cancer Neg Hx   . Throat cancer Neg Hx   . Pancreatic cancer Neg Hx   . Stomach cancer Neg Hx   . Heart disease Neg Hx   . Kidney disease Neg Hx   . Liver disease Neg Hx     Social History:  reports that she has been smoking cigarettes. She has smoked for the past 8.00 years. She has never used  smokeless tobacco. She reports current alcohol use. She reports that she does not use drugs.  Allergies: No Known Allergies  No medications prior to admission.    Review of Systems neg   Height 5\' 7"  (1.702 m), weight 117.9 kg, last menstrual period 05/22/2020.   Physical Exam  BP (!) 120/92   Pulse 82   Temp 99.3 F (37.4 C) (Oral)   Resp 18   Ht 5\' 7"  (1.702 m)   Wt 117.2 kg   LMP 05/22/2020   SpO2 99%   BMI 40.46 kg/m   A&O x 3, no acute distress. Pleasant HEENT neg Lungs CTA bilat CV RRR, S1S2 normal Abdo soft, non tender, non acute Extr no edema/ tenderness Pelvic Uterus 8-10 wks,  tender, normal cervix, normal ovaries   Assessment/Plan: 40 yo female with failed endometrial ablation, worsening pelvic pain, dysmenorrhea, desiring hysterectomy.Here for davinci assisted total laparoscopic hysterectomy.   Risks/complications of surgery reviewed incl infection, bleeding, damage to internal organs including bladder, bowels, ureters, blood vessels, other risks from anesthesia, VTE and delayed complications of any surgery, complications in future surgery reviewed. Informed written consent obtained. Angelica Diaz 06/12/2020, 9:49 PM   Addendum-->  Patient assessed in holding area, H&P reviewed and plan discussed, agree with note and plan.  Azucena Fallen, MD

## 2020-06-13 ENCOUNTER — Ambulatory Visit (HOSPITAL_BASED_OUTPATIENT_CLINIC_OR_DEPARTMENT_OTHER): Payer: BC Managed Care – PPO | Admitting: Anesthesiology

## 2020-06-13 ENCOUNTER — Encounter (HOSPITAL_BASED_OUTPATIENT_CLINIC_OR_DEPARTMENT_OTHER): Payer: Self-pay | Admitting: Obstetrics & Gynecology

## 2020-06-13 ENCOUNTER — Ambulatory Visit (HOSPITAL_BASED_OUTPATIENT_CLINIC_OR_DEPARTMENT_OTHER)
Admission: RE | Admit: 2020-06-13 | Discharge: 2020-06-14 | Disposition: A | Discharge status: Home / Self Care | Payer: BC Managed Care – PPO | Source: Ambulatory Visit | Attending: Obstetrics & Gynecology | Admitting: Obstetrics & Gynecology

## 2020-06-13 ENCOUNTER — Encounter (HOSPITAL_BASED_OUTPATIENT_CLINIC_OR_DEPARTMENT_OTHER)
Admission: RE | Disposition: A | Discharge status: Home / Self Care | Payer: Self-pay | Source: Ambulatory Visit | Attending: Obstetrics & Gynecology

## 2020-06-13 DIAGNOSIS — Z8616 Personal history of COVID-19: Secondary | ICD-10-CM | POA: Diagnosis not present

## 2020-06-13 DIAGNOSIS — N895 Stricture and atresia of vagina: Secondary | ICD-10-CM | POA: Insufficient documentation

## 2020-06-13 DIAGNOSIS — N9985 Post endometrial ablation syndrome: Secondary | ICD-10-CM | POA: Insufficient documentation

## 2020-06-13 DIAGNOSIS — N946 Dysmenorrhea, unspecified: Secondary | ICD-10-CM | POA: Diagnosis not present

## 2020-06-13 DIAGNOSIS — Z833 Family history of diabetes mellitus: Secondary | ICD-10-CM | POA: Insufficient documentation

## 2020-06-13 DIAGNOSIS — N736 Female pelvic peritoneal adhesions (postinfective): Secondary | ICD-10-CM | POA: Diagnosis not present

## 2020-06-13 DIAGNOSIS — G4700 Insomnia, unspecified: Secondary | ICD-10-CM | POA: Diagnosis not present

## 2020-06-13 DIAGNOSIS — Z820 Family history of epilepsy and other diseases of the nervous system: Secondary | ICD-10-CM | POA: Diagnosis not present

## 2020-06-13 DIAGNOSIS — N920 Excessive and frequent menstruation with regular cycle: Secondary | ICD-10-CM | POA: Insufficient documentation

## 2020-06-13 DIAGNOSIS — F17210 Nicotine dependence, cigarettes, uncomplicated: Secondary | ICD-10-CM | POA: Insufficient documentation

## 2020-06-13 DIAGNOSIS — F418 Other specified anxiety disorders: Secondary | ICD-10-CM | POA: Diagnosis not present

## 2020-06-13 DIAGNOSIS — Z90710 Acquired absence of both cervix and uterus: Secondary | ICD-10-CM | POA: Diagnosis present

## 2020-06-13 HISTORY — PX: ROBOTIC ASSISTED TOTAL HYSTERECTOMY: SHX6085

## 2020-06-13 LAB — TYPE AND SCREEN
ABO/RH(D): A POS
Antibody Screen: NEGATIVE

## 2020-06-13 LAB — ABO/RH: ABO/RH(D): A POS

## 2020-06-13 LAB — POCT PREGNANCY, URINE: Preg Test, Ur: NEGATIVE

## 2020-06-13 SURGERY — HYSTERECTOMY, TOTAL, ROBOT-ASSISTED
Anesthesia: General | Site: Abdomen

## 2020-06-13 MED ORDER — LIDOCAINE 2% (20 MG/ML) 5 ML SYRINGE
INTRAMUSCULAR | Status: DC | PRN
  Administered 2020-06-13: 1.5 mg/kg/h via INTRAVENOUS

## 2020-06-13 MED ORDER — ROCURONIUM BROMIDE 10 MG/ML (PF) SYRINGE
PREFILLED_SYRINGE | INTRAVENOUS | Status: AC
  Filled 2020-06-13: qty 10

## 2020-06-13 MED ORDER — FENTANYL CITRATE (PF) 100 MCG/2ML IJ SOLN
INTRAMUSCULAR | Status: AC
  Filled 2020-06-13: qty 2

## 2020-06-13 MED ORDER — SODIUM CHLORIDE 0.9 % IR SOLN
Status: DC | PRN
  Administered 2020-06-13: 250 mL

## 2020-06-13 MED ORDER — SUGAMMADEX SODIUM 200 MG/2ML IV SOLN
INTRAVENOUS | Status: DC | PRN
  Administered 2020-06-13: 400 mg via INTRAVENOUS

## 2020-06-13 MED ORDER — HYDROCODONE-ACETAMINOPHEN 5-325 MG PO TABS
ORAL_TABLET | ORAL | Status: AC
  Filled 2020-06-13: qty 2

## 2020-06-13 MED ORDER — HYDROMORPHONE HCL 1 MG/ML IJ SOLN
INTRAMUSCULAR | Status: AC
  Filled 2020-06-13: qty 1

## 2020-06-13 MED ORDER — ONDANSETRON HCL 4 MG/2ML IJ SOLN
INTRAMUSCULAR | Status: AC
  Filled 2020-06-13: qty 2

## 2020-06-13 MED ORDER — KETOROLAC TROMETHAMINE 30 MG/ML IJ SOLN
INTRAMUSCULAR | Status: DC | PRN
  Administered 2020-06-13: 30 mg via INTRAVENOUS

## 2020-06-13 MED ORDER — FENTANYL CITRATE (PF) 100 MCG/2ML IJ SOLN
25.0000 ug | INTRAMUSCULAR | Status: DC | PRN
  Administered 2020-06-13 (×2): 50 ug via INTRAVENOUS

## 2020-06-13 MED ORDER — DEXMEDETOMIDINE (PRECEDEX) IN NS 20 MCG/5ML (4 MCG/ML) IV SYRINGE
PREFILLED_SYRINGE | INTRAVENOUS | Status: DC | PRN
  Administered 2020-06-13 (×3): 4 ug via INTRAVENOUS
  Administered 2020-06-13: 8 ug via INTRAVENOUS

## 2020-06-13 MED ORDER — KETOROLAC TROMETHAMINE 30 MG/ML IJ SOLN
30.0000 mg | Freq: Four times a day (QID) | INTRAMUSCULAR | Status: DC
  Administered 2020-06-13 – 2020-06-14 (×2): 30 mg via INTRAVENOUS

## 2020-06-13 MED ORDER — SODIUM CHLORIDE 0.9 % IR SOLN
Status: DC | PRN
  Administered 2020-06-13: 500 mL

## 2020-06-13 MED ORDER — MENTHOL 3 MG MT LOZG
LOZENGE | OROMUCOSAL | Status: AC
  Filled 2020-06-13: qty 9

## 2020-06-13 MED ORDER — CEFAZOLIN SODIUM-DEXTROSE 2-4 GM/100ML-% IV SOLN
2.0000 g | INTRAVENOUS | Status: AC
  Administered 2020-06-13: 2 g via INTRAVENOUS

## 2020-06-13 MED ORDER — HYDROCODONE-ACETAMINOPHEN 5-325 MG PO TABS
1.0000 | ORAL_TABLET | ORAL | Status: DC | PRN
  Administered 2020-06-13 – 2020-06-14 (×4): 2 via ORAL

## 2020-06-13 MED ORDER — SODIUM CHLORIDE 0.9 % IV SOLN
INTRAVENOUS | Status: DC | PRN
  Administered 2020-06-13: 60 mL

## 2020-06-13 MED ORDER — CEFAZOLIN SODIUM-DEXTROSE 2-4 GM/100ML-% IV SOLN
INTRAVENOUS | Status: AC
  Filled 2020-06-13: qty 100

## 2020-06-13 MED ORDER — ONDANSETRON HCL 4 MG/2ML IJ SOLN: 4.0000 mg | Freq: Four times a day (QID) | INTRAMUSCULAR | Status: DC | PRN

## 2020-06-13 MED ORDER — LABETALOL HCL 5 MG/ML IV SOLN
INTRAVENOUS | Status: DC | PRN
  Administered 2020-06-13 (×2): 5 mg via INTRAVENOUS

## 2020-06-13 MED ORDER — ACETAMINOPHEN 500 MG PO TABS: 1000.0000 mg | ORAL_TABLET | Freq: Four times a day (QID) | ORAL | Status: DC

## 2020-06-13 MED ORDER — LIDOCAINE 2% (20 MG/ML) 5 ML SYRINGE
INTRAMUSCULAR | Status: AC
  Filled 2020-06-13: qty 5

## 2020-06-13 MED ORDER — MENTHOL 3 MG MT LOZG
1.0000 | LOZENGE | OROMUCOSAL | Status: DC | PRN
  Administered 2020-06-13: 3 mg via ORAL

## 2020-06-13 MED ORDER — KETOROLAC TROMETHAMINE 30 MG/ML IJ SOLN: 30.0000 mg | Freq: Once | INTRAMUSCULAR | Status: DC

## 2020-06-13 MED ORDER — PROMETHAZINE HCL 25 MG/ML IJ SOLN: 6.2500 mg | INTRAMUSCULAR | Status: DC | PRN

## 2020-06-13 MED ORDER — MIDAZOLAM HCL 2 MG/2ML IJ SOLN
INTRAMUSCULAR | Status: AC
  Filled 2020-06-13: qty 2

## 2020-06-13 MED ORDER — CLONIDINE HCL 0.2 MG PO TABS
0.2000 mg | ORAL_TABLET | Freq: Every day | ORAL | Status: DC
  Administered 2020-06-13: 0.2 mg via ORAL

## 2020-06-13 MED ORDER — ONDANSETRON HCL 4 MG PO TABS: 4.0000 mg | ORAL_TABLET | Freq: Four times a day (QID) | ORAL | Status: DC | PRN

## 2020-06-13 MED ORDER — KETOROLAC TROMETHAMINE 30 MG/ML IJ SOLN
INTRAMUSCULAR | Status: AC
  Filled 2020-06-13: qty 1

## 2020-06-13 MED ORDER — BUSPIRONE HCL 10 MG PO TABS
30.0000 mg | ORAL_TABLET | Freq: Every day | ORAL | Status: DC
  Administered 2020-06-13: 30 mg via ORAL
  Filled 2020-06-13: qty 2

## 2020-06-13 MED ORDER — HYDROMORPHONE HCL 1 MG/ML IJ SOLN
INTRAMUSCULAR | Status: AC
  Filled 2020-06-13: qty 2

## 2020-06-13 MED ORDER — PROPOFOL 10 MG/ML IV BOLUS
INTRAVENOUS | Status: DC | PRN
  Administered 2020-06-13: 200 mg via INTRAVENOUS

## 2020-06-13 MED ORDER — DOXEPIN HCL 25 MG PO CAPS
75.0000 mg | ORAL_CAPSULE | Freq: Every day | ORAL | Status: DC
  Administered 2020-06-13: 75 mg via ORAL
  Filled 2020-06-13: qty 3

## 2020-06-13 MED ORDER — IBUPROFEN 800 MG PO TABS: 800.0000 mg | ORAL_TABLET | Freq: Four times a day (QID) | ORAL | Status: DC

## 2020-06-13 MED ORDER — ONDANSETRON HCL 4 MG/2ML IJ SOLN
INTRAMUSCULAR | Status: DC | PRN
  Administered 2020-06-13: 4 mg via INTRAVENOUS

## 2020-06-13 MED ORDER — LACTATED RINGERS IV SOLN: INTRAVENOUS | Status: DC

## 2020-06-13 MED ORDER — OXYCODONE HCL 5 MG PO TABS: 5.0000 mg | ORAL_TABLET | Freq: Once | ORAL | Status: DC | PRN

## 2020-06-13 MED ORDER — LIDOCAINE 2% (20 MG/ML) 5 ML SYRINGE
INTRAMUSCULAR | Status: DC | PRN
  Administered 2020-06-13: 60 mg via INTRAVENOUS

## 2020-06-13 MED ORDER — FLUOXETINE HCL 10 MG PO CAPS
10.0000 mg | ORAL_CAPSULE | Freq: Every day | ORAL | Status: DC
  Administered 2020-06-13: 10 mg via ORAL
  Filled 2020-06-13: qty 1

## 2020-06-13 MED ORDER — SIMETHICONE 80 MG PO CHEW: 80.0000 mg | CHEWABLE_TABLET | Freq: Four times a day (QID) | ORAL | Status: DC | PRN

## 2020-06-13 MED ORDER — OXYCODONE HCL 5 MG/5ML PO SOLN: 5.0000 mg | Freq: Once | ORAL | Status: DC | PRN

## 2020-06-13 MED ORDER — POVIDONE-IODINE 10 % EX SWAB: 2.0000 "application " | Freq: Once | CUTANEOUS | Status: DC

## 2020-06-13 MED ORDER — DEXAMETHASONE SODIUM PHOSPHATE 10 MG/ML IJ SOLN
INTRAMUSCULAR | Status: DC | PRN
  Administered 2020-06-13 (×2): 5 mg via INTRAVENOUS

## 2020-06-13 MED ORDER — HYDROMORPHONE HCL 1 MG/ML IJ SOLN
0.2500 mg | INTRAMUSCULAR | Status: DC | PRN
  Administered 2020-06-13 (×4): 0.5 mg via INTRAVENOUS

## 2020-06-13 MED ORDER — MIDAZOLAM HCL 2 MG/2ML IJ SOLN
INTRAMUSCULAR | Status: DC | PRN
  Administered 2020-06-13: 2 mg via INTRAVENOUS

## 2020-06-13 MED ORDER — DEXMEDETOMIDINE (PRECEDEX) IN NS 20 MCG/5ML (4 MCG/ML) IV SYRINGE
PREFILLED_SYRINGE | INTRAVENOUS | Status: AC
  Filled 2020-06-13: qty 5

## 2020-06-13 MED ORDER — LACTATED RINGERS IV BOLUS
300.0000 mL | Freq: Once | INTRAVENOUS | Status: AC
  Administered 2020-06-13: 300 mL via INTRAVENOUS

## 2020-06-13 MED ORDER — SUGAMMADEX SODIUM 500 MG/5ML IV SOLN
INTRAVENOUS | Status: AC
  Filled 2020-06-13: qty 5

## 2020-06-13 MED ORDER — ROCURONIUM BROMIDE 10 MG/ML (PF) SYRINGE
PREFILLED_SYRINGE | INTRAVENOUS | Status: DC | PRN
  Administered 2020-06-13: 80 mg via INTRAVENOUS
  Administered 2020-06-13 (×2): 10 mg via INTRAVENOUS
  Administered 2020-06-13: 20 mg via INTRAVENOUS

## 2020-06-13 MED ORDER — LABETALOL HCL 5 MG/ML IV SOLN
INTRAVENOUS | Status: AC
  Filled 2020-06-13: qty 4

## 2020-06-13 MED ORDER — CLONIDINE HCL 0.2 MG PO TABS
ORAL_TABLET | ORAL | Status: AC
  Filled 2020-06-13: qty 1

## 2020-06-13 MED ORDER — HYDROMORPHONE HCL 1 MG/ML IJ SOLN
2.0000 mg | Freq: Once | INTRAMUSCULAR | Status: AC
  Administered 2020-06-13: 2 mg via INTRAVENOUS

## 2020-06-13 MED ORDER — FENTANYL CITRATE (PF) 100 MCG/2ML IJ SOLN
INTRAMUSCULAR | Status: DC | PRN
  Administered 2020-06-13 (×2): 50 ug via INTRAVENOUS
  Administered 2020-06-13: 25 ug via INTRAVENOUS
  Administered 2020-06-13 (×2): 50 ug via INTRAVENOUS
  Administered 2020-06-13: 25 ug via INTRAVENOUS
  Administered 2020-06-13: 50 ug via INTRAVENOUS

## 2020-06-13 MED ORDER — DEXAMETHASONE SODIUM PHOSPHATE 10 MG/ML IJ SOLN
INTRAMUSCULAR | Status: AC
  Filled 2020-06-13: qty 1

## 2020-06-13 SURGICAL SUPPLY — 63 items
APPLICATOR ARISTA FLEXITIP XL (MISCELLANEOUS) IMPLANT
BARRIER ADHS 3X4 INTERCEED (GAUZE/BANDAGES/DRESSINGS) IMPLANT
CATH FOLEY 3WAY  5CC 16FR (CATHETERS) ×2
CATH FOLEY 3WAY 5CC 16FR (CATHETERS) ×1 IMPLANT
COVER BACK TABLE 60X90IN (DRAPES) ×2 IMPLANT
COVER TIP SHEARS 8 DVNC (MISCELLANEOUS) ×1 IMPLANT
COVER TIP SHEARS 8MM DA VINCI (MISCELLANEOUS) ×2
DECANTER SPIKE VIAL GLASS SM (MISCELLANEOUS) ×2 IMPLANT
DEFOGGER SCOPE WARMER CLEARIFY (MISCELLANEOUS) ×2 IMPLANT
DERMABOND ADVANCED (GAUZE/BANDAGES/DRESSINGS) ×1
DERMABOND ADVANCED .7 DNX12 (GAUZE/BANDAGES/DRESSINGS) ×1 IMPLANT
DRAPE ARM DVNC X/XI (DISPOSABLE) ×4 IMPLANT
DRAPE COLUMN DVNC XI (DISPOSABLE) ×1 IMPLANT
DRAPE DA VINCI XI ARM (DISPOSABLE) ×8
DRAPE DA VINCI XI COLUMN (DISPOSABLE) ×2
DRAPE UTILITY XL STRL (DRAPES) ×2 IMPLANT
DURAPREP 26ML APPLICATOR (WOUND CARE) ×2 IMPLANT
ELECT REM PT RETURN 9FT ADLT (ELECTROSURGICAL) ×2
ELECTRODE REM PT RTRN 9FT ADLT (ELECTROSURGICAL) ×1 IMPLANT
GAUZE 4X4 16PLY RFD (DISPOSABLE) ×2 IMPLANT
GAUZE PETROLATUM 1 X8 (GAUZE/BANDAGES/DRESSINGS) IMPLANT
GLOVE SURG ENC MOIS LTX SZ7 (GLOVE) ×6 IMPLANT
GLOVE SURG UNDER POLY LF SZ7 (GLOVE) ×10 IMPLANT
HEMOSTAT ARISTA ABSORB 3G PWDR (HEMOSTASIS) IMPLANT
HOLDER FOLEY CATH W/STRAP (MISCELLANEOUS) IMPLANT
IRRIG SUCT STRYKERFLOW 2 WTIP (MISCELLANEOUS) ×2
IRRIGATION SUCT STRKRFLW 2 WTP (MISCELLANEOUS) ×1 IMPLANT
IV NS 1000ML (IV SOLUTION) ×4
IV NS 1000ML BAXH (IV SOLUTION) ×2 IMPLANT
KIT TURNOVER CYSTO (KITS) ×2 IMPLANT
LEGGING LITHOTOMY PAIR STRL (DRAPES) ×2 IMPLANT
OBTURATOR OPTICAL STANDARD 8MM (TROCAR) ×2
OBTURATOR OPTICAL STND 8 DVNC (TROCAR) ×1
OBTURATOR OPTICALSTD 8 DVNC (TROCAR) ×1 IMPLANT
OCCLUDER COLPOPNEUMO (BALLOONS) ×2 IMPLANT
PACK ROBOT WH (CUSTOM PROCEDURE TRAY) ×2 IMPLANT
PACK ROBOTIC GOWN (GOWN DISPOSABLE) ×2 IMPLANT
PACK TRENDGUARD 450 HYBRID PRO (MISCELLANEOUS) ×1 IMPLANT
PAD OB MATERNITY 4.3X12.25 (PERSONAL CARE ITEMS) ×2 IMPLANT
PAD PREP 24X48 CUFFED NSTRL (MISCELLANEOUS) ×2 IMPLANT
PROTECTOR NERVE ULNAR (MISCELLANEOUS) IMPLANT
SEAL CANN UNIV 5-8 DVNC XI (MISCELLANEOUS) ×4 IMPLANT
SEAL XI 5MM-8MM UNIVERSAL (MISCELLANEOUS) ×8
SEALER VESSEL DA VINCI XI (MISCELLANEOUS) ×2
SEALER VESSEL EXT DVNC XI (MISCELLANEOUS) ×1 IMPLANT
SET IRRIG Y TYPE TUR BLADDER L (SET/KITS/TRAYS/PACK) ×2 IMPLANT
SET TRI-LUMEN FLTR TB AIRSEAL (TUBING) ×2 IMPLANT
SUT VIC AB 4-0 PS2 18 (SUTURE) ×4 IMPLANT
SUT VICRYL 0 UR6 27IN ABS (SUTURE) ×4 IMPLANT
SUT VLOC 180 0 9IN  GS21 (SUTURE) ×2
SUT VLOC 180 0 9IN GS21 (SUTURE) ×1 IMPLANT
TIP RUMI ORANGE 6.7MMX12CM (TIP) IMPLANT
TIP UTERINE 5.1X6CM LAV DISP (MISCELLANEOUS) IMPLANT
TIP UTERINE 6.7X10CM GRN DISP (MISCELLANEOUS) IMPLANT
TIP UTERINE 6.7X6CM WHT DISP (MISCELLANEOUS) IMPLANT
TIP UTERINE 6.7X8CM BLUE DISP (MISCELLANEOUS) ×2 IMPLANT
TOWEL OR 17X26 10 PK STRL BLUE (TOWEL DISPOSABLE) ×2 IMPLANT
TRAY FOL W/BAG SLVR 16FR STRL (SET/KITS/TRAYS/PACK) IMPLANT
TRAY FOLEY W/BAG SLVR 16FR LF (SET/KITS/TRAYS/PACK)
TRENDGUARD 450 HYBRID PRO PACK (MISCELLANEOUS) ×2
TROCAR PORT AIRSEAL 5X120 (TROCAR) ×2 IMPLANT
WATER STERILE IRR 1000ML POUR (IV SOLUTION) IMPLANT
WATER STERILE IRR 500ML POUR (IV SOLUTION) ×2 IMPLANT

## 2020-06-13 NOTE — Anesthesia Preprocedure Evaluation (Addendum)
Anesthesia Evaluation  Patient identified by MRN, date of birth, ID band Patient awake    Reviewed: Allergy & Precautions, NPO status , Patient's Chart, lab work & pertinent test results  History of Anesthesia Complications Negative for: history of anesthetic complications  Airway Mallampati: III  TM Distance: >3 FB Neck ROM: Full    Dental  (+) Dental Advisory Given, Teeth Intact   Pulmonary asthma , Current SmokerPatient did not abstain from smoking.,    Pulmonary exam normal        Cardiovascular negative cardio ROS Normal cardiovascular exam     Neuro/Psych  Headaches, PSYCHIATRIC DISORDERS Anxiety Depression Bipolar Disorder  Meningioma     GI/Hepatic negative GI ROS, Neg liver ROS,   Endo/Other  Morbid obesity  Renal/GU negative Renal ROS     Musculoskeletal  (+) Arthritis ,   Abdominal (+) + obese,   Peds  Hematology negative hematology ROS (+)   Anesthesia Other Findings Covid+ 03/2019 HSV Covid test negative   Reproductive/Obstetrics                            Anesthesia Physical Anesthesia Plan  ASA: III  Anesthesia Plan: General   Post-op Pain Management:    Induction: Intravenous  PONV Risk Score and Plan: 3 and Treatment may vary due to age or medical condition, Ondansetron, Dexamethasone, Midazolam and Scopolamine patch - Pre-op  Airway Management Planned: Oral ETT  Additional Equipment: None  Intra-op Plan:   Post-operative Plan: Extubation in OR  Informed Consent: I have reviewed the patients History and Physical, chart, labs and discussed the procedure including the risks, benefits and alternatives for the proposed anesthesia with the patient or authorized representative who has indicated his/her understanding and acceptance.     Dental advisory given  Plan Discussed with: CRNA and Anesthesiologist  Anesthesia Plan Comments:        Anesthesia  Quick Evaluation

## 2020-06-13 NOTE — Anesthesia Procedure Notes (Signed)
Procedure Name: Intubation Date/Time: 06/13/2020 1:27 PM Performed by: Suan Halter, CRNA Pre-anesthesia Checklist: Patient identified, Emergency Drugs available, Suction available and Patient being monitored Patient Re-evaluated:Patient Re-evaluated prior to induction Oxygen Delivery Method: Circle system utilized Preoxygenation: Pre-oxygenation with 100% oxygen Induction Type: IV induction Ventilation: Mask ventilation without difficulty Laryngoscope Size: Mac and 3 Grade View: Grade I Tube type: Oral Tube size: 7.0 mm Number of attempts: 1 Airway Equipment and Method: Stylet and Oral airway Placement Confirmation: ETT inserted through vocal cords under direct vision,  positive ETCO2 and breath sounds checked- equal and bilateral Secured at: 22 cm Tube secured with: Tape Dental Injury: Teeth and Oropharynx as per pre-operative assessment

## 2020-06-13 NOTE — Op Note (Signed)
Preoperative diagnosis: Post endometrial ablation syndrome with menorrhagia, dysmenorrhea  Postop diagnosis: Same Procedure: da Vinci robot assisted total laparoscopic hysterectomy Anesthesia Gen. Endotracheal Surgeon: Dr. Azucena Fallen Assistant: Derrell Lolling, CNM IV fluids: 1000 cc LR EBL: 50cc Urine output: 614 cc, clear Complications: none Pathology: Uterus with cervix Disposition: PACU, stable Findings: Uterus normal size, ovaries normal, fallopian tubes absent. Dense bladder adhesions to anterior lower uterus.  Normal bilateral ureteral peristalsis  Procedure:  Indication: Menorrhagia and worsening dysmenorrhea after 3 months status post endometrial ablation and patient declined medical intervention, chose to proceed with hysterectomy.  Complications of surgery including infection, bleeding, damage to internal organs and other surgery related problems including pneumonia, VTE reviewed and informed written consent was obtained. She understood and gave informed written consent.  Patient was brought to the operating room with IV running. She received 2 gm Ancef. She underwent general anesthesia without difficulty and was given dorsal lithotomy position, prepped and draped in sterile fashion. Foley catheter was placed. Cervix was exposed with a speculum, limited mobility noted (one SVD and one C/section) and anterior lip of the cervix was grasped with tenaculum. Stitch of 0-Vicryl taken on cervix to help with manipulation as I expected some difficulty with maneuvering due to body habitus and long narrow vaginal canal. Uterus was sounded to 12 cm but I was concerned there was a fundal perforation, so I chose  # 8 Rumi tip and a small Koh ring and were assembled and inserted in the uterine cavity and balloon was inflated to secure it in place. Koh ring was palpated again cervico-vaginal junction. Speculum was removed, tenaculum was left on the cervix.   Attention was focused on abdomen.  Supraumbilical 12 mm transverse incision made in her old laparoscopy scar after injecting Ropivacaine, fascia dissected, grasped with Kocher's and incised, posterior rectus sheath and peritoneum were bluntly entered and retractor placed, intraabdominal entry confirmed. Purse string stay stitch on 0-Vicryl taken on fascia and robot cannula with reducer inserted and Vicryl sutures securedcannula. Pneumoperitoneum was begun. Laparoscope was introduced and the peritoneal cavity was evaluated. Omentum was large and bowels were not prepped well. There was no abdominal wall adhesions. Trendelenburg position given.  Port sited marked and injected with Ropivacaine. Two Robotic cannulas inserted on right side and one #5 Air seal and one robotic canula on the left side under vision. Robot was docked from the right. Vessel sealer in arm 1, laparoscope in arm 2 and Prograsp and scissors in arms 3 and 4 inserted under vision and secured and I scrubbed out and went for surgical console.  Uterus was not large and a fundal perforation of Rumi tip noted with balloon partially noted from the fundus. Anterior bladder adhesions noted pretty dense to lower uterine/ cervical junction. Both the ovaries were normal and bilateral ureteral course in pelvis assessed with no adhesions noted posteriorly. Both the round ligaments were quite attenuated.  Uterus was deviated to the patient's right. The left utero-ovarian ligament was desiccated and cut, followed by broad ligament and then the left Round ligament which was desiccated and cut. Uterus was retroflexed and anterior dissection performed after retrograde fill of bladder with 300 cc saline. Uterus was deviated to the left and right utero-ovarian ligament was desiccated and incised followed by broad ligament and right Round ligament. Anterior broad ligament was incised to create bladder flap carefully watching bladder reflection and bladder was pushed away by blunt and sharp dissection  with excellent hemostasis. Koh ring impression at cervicovaginal junction was  seen well anteriorly. Bladder was drained and procedure continued. Right posterior broad ligament dissected, right Uterine vessels skeletonized. Right uterine vessels were desiccated and cut. Uterus was deviated to the right and the left uterine vessels were desiccated and cut. Vaginal occluder was inflated. Colpotomy was begun starting ;eft and posterior and right and finally anteriorly. I stayed above the uterosacral ligaments posteriorly. Uterus and cervi were pulled out of the vaginal opening and vaginal occluder placed back.  Vaginal cut edges were evaluated for hemostasis which was excellent. Irrigation was performed pedicles appeared dry. Robotic instruments switched for needle driver in #3 and Prograsp moved to #4. Vaginal closer performed with 0-V-Lock in running fashion from right to left and back to midline. Irrigation was performed, vaginal closure and all pedicles appeared to be hemostatic.  Robotic instruments were removed. Robot was undocked. Patient was made supine. Lap'scope was reintroduced, hemostasis was excellent. Liver surface appeared normal.  Robotic cannulas were removed under vision and airseal port removed after deflating pneumoperitoneum. Stay sutures at the fascia tied together with excellent fascial closure. Skin approximated with subcuticular stitches on 4-0 Vicryl. Dermabond was applied.  No vaginal bleeding noted on vaginal exam at the end. Foley noted clear urine and was removed before emerging from anesthesia.  All instruments/lap/sponges counts were correct x2.  No complications. Patient tolerated procedure well and brought to the PACU extubated and in stable condition.  Dr Benjie Karvonen was the surgeon for entire case.   I performed this surgery. ---Azucena Fallen ,MD

## 2020-06-13 NOTE — Transfer of Care (Signed)
Immediate Anesthesia Transfer of Care Note  Patient: Angelica Diaz  Procedure(s) Performed: Procedure(s) (LRB): XI ROBOTIC ASSISTED TOTAL HYSTERECTOMY (N/A)  Patient Location: PACU  Anesthesia Type: General  Level of Consciousness: awake, oriented, sedated and patient cooperative  Airway & Oxygen Therapy: Patient Spontanous Breathing and Patient connected to face mask oxygen  Post-op Assessment: Report given to PACU RN and Post -op Vital signs reviewed and stable  Post vital signs: Reviewed and stable  Complications: No apparent anesthesia complications Last Vitals:  Vitals Value Taken Time  BP 110/80 06/13/20 1604  Temp 36.4 C 06/13/20 1604  Pulse 69 06/13/20 1608  Resp 17 06/13/20 1608  SpO2 98 % 06/13/20 1608  Vitals shown include unvalidated device data.  Last Pain:  Vitals:   06/13/20 1144  TempSrc: Oral  PainSc: 0-No pain      Patients Stated Pain Goal: 5 (41/44/36 0165)  Complications: No complications documented.

## 2020-06-13 NOTE — Anesthesia Postprocedure Evaluation (Signed)
Anesthesia Post Note  Patient: Angelica Diaz  Procedure(s) Performed: XI ROBOTIC ASSISTED TOTAL HYSTERECTOMY (N/A Abdomen)     Patient location during evaluation: PACU Anesthesia Type: General Level of consciousness: awake and alert Pain management: pain level controlled Vital Signs Assessment: post-procedure vital signs reviewed and stable Respiratory status: spontaneous breathing, nonlabored ventilation, respiratory function stable and patient connected to nasal cannula oxygen Cardiovascular status: blood pressure returned to baseline and stable Postop Assessment: no apparent nausea or vomiting Anesthetic complications: no   No complications documented.  Last Vitals:  Vitals:   06/13/20 1725 06/13/20 1753  BP: 124/90 126/76  Pulse: 75 70  Resp: 18 17  Temp:  36.6 C  SpO2: 100% 100%    Last Pain:  Vitals:   06/13/20 1659  TempSrc:   PainSc: 8                  Xachary Hambly S

## 2020-06-14 ENCOUNTER — Encounter (HOSPITAL_BASED_OUTPATIENT_CLINIC_OR_DEPARTMENT_OTHER): Payer: Self-pay | Admitting: Obstetrics & Gynecology

## 2020-06-14 DIAGNOSIS — Z820 Family history of epilepsy and other diseases of the nervous system: Secondary | ICD-10-CM | POA: Diagnosis not present

## 2020-06-14 DIAGNOSIS — F17210 Nicotine dependence, cigarettes, uncomplicated: Secondary | ICD-10-CM | POA: Diagnosis not present

## 2020-06-14 DIAGNOSIS — N895 Stricture and atresia of vagina: Secondary | ICD-10-CM | POA: Diagnosis not present

## 2020-06-14 DIAGNOSIS — Z8616 Personal history of COVID-19: Secondary | ICD-10-CM | POA: Diagnosis not present

## 2020-06-14 DIAGNOSIS — N920 Excessive and frequent menstruation with regular cycle: Secondary | ICD-10-CM | POA: Diagnosis not present

## 2020-06-14 DIAGNOSIS — N946 Dysmenorrhea, unspecified: Secondary | ICD-10-CM | POA: Diagnosis not present

## 2020-06-14 DIAGNOSIS — Z833 Family history of diabetes mellitus: Secondary | ICD-10-CM | POA: Diagnosis not present

## 2020-06-14 DIAGNOSIS — N9985 Post endometrial ablation syndrome: Secondary | ICD-10-CM | POA: Diagnosis not present

## 2020-06-14 LAB — CBC
HCT: 38.4 % (ref 36.0–46.0)
Hemoglobin: 12.8 g/dL (ref 12.0–15.0)
MCH: 32.2 pg (ref 26.0–34.0)
MCHC: 33.3 g/dL (ref 30.0–36.0)
MCV: 96.5 fL (ref 80.0–100.0)
Platelets: 240 10*3/uL (ref 150–400)
RBC: 3.98 MIL/uL (ref 3.87–5.11)
RDW: 11.9 % (ref 11.5–15.5)
WBC: 10.5 10*3/uL (ref 4.0–10.5)
nRBC: 0 % (ref 0.0–0.2)

## 2020-06-14 MED ORDER — HYDROCODONE-ACETAMINOPHEN 5-325 MG PO TABS: 2.0000 | ORAL_TABLET | Freq: Four times a day (QID) | ORAL | 0 refills | Status: AC | PRN

## 2020-06-14 MED ORDER — HYDROCODONE-ACETAMINOPHEN 5-325 MG PO TABS
ORAL_TABLET | ORAL | Status: AC
  Filled 2020-06-14: qty 2

## 2020-06-14 MED ORDER — KETOROLAC TROMETHAMINE 30 MG/ML IJ SOLN
INTRAMUSCULAR | Status: AC
  Filled 2020-06-14: qty 1

## 2020-06-14 MED ORDER — ACETAMINOPHEN 500 MG PO TABS: 1000.0000 mg | ORAL_TABLET | Freq: Four times a day (QID) | ORAL | 0 refills | Status: DC

## 2020-06-14 MED ORDER — IBUPROFEN 800 MG PO TABS: 800.0000 mg | ORAL_TABLET | Freq: Four times a day (QID) | ORAL | 0 refills | Status: DC

## 2020-06-14 NOTE — Discharge Summary (Signed)
Physician Discharge Summary  Patient ID: Angelica Diaz MRN: [id number] DOB/AGE: May 11, 1980 40 y.o.  Admit date: 06/13/2020 Discharge date: 06/14/2020  Admission Diagnoses:Menorrhagia, dysmenorrhea, pelvic pain, post endometrial ablation syndrome   Discharge Diagnoses:  Active Problems:   Post endometrial ablation syndrome   S/P laparoscopic hysterectomy   Discharged Condition: good  Hospital Course: Uncomplicated. Overnight pain management with PO Percocet and one Dilaudid dose. She was voiding well, ambulated, tolerated regular diet, flatus + and no vaginal bleeding   CBC Latest Ref Rng & Units 06/14/2020 06/11/2020 03/20/2020  WBC 4.0 - 10.5 K/uL 10.5 7.8 6.8  Hemoglobin 12.0 - 15.0 g/dL 12.8 14.5 14.7  Hematocrit 36.0 - 46.0 % 38.4 44.3 44.1  Platelets 150 - 400 K/uL 240 261 250   Discharge Exam: Blood pressure 111/65, pulse 76, temperature 98.1 F (36.7 C), resp. rate 16, height 5\' 7"  (1.702 m), weight 117.2 kg, last menstrual period 05/22/2020, SpO2 100 %. General appearance: alert and cooperative Head: Normocephalic, without obvious abnormality Resp: clear to auscultation bilaterally Cardio: regular rate and rhythm, S1, S2 normal, no murmur, click, rub or gallop GI: soft, non-tender; bowel sounds normal; no masses,  no organomegaly Extremities: extremities normal, atraumatic, no cyanosis or edema Incision/Wound: dry  Disposition: Discharge disposition: 01-Home or Self Care       Discharge Instructions    Call MD for:   Complete by: As directed    Vaginal bleeding   Call MD for:  difficulty breathing, headache or visual disturbances   Complete by: As directed    Call MD for:  extreme fatigue   Complete by: As directed    Call MD for:  hives   Complete by: As directed    Call MD for:  persistant dizziness or light-headedness   Complete by: As directed    Call MD for:  persistant nausea and vomiting   Complete by: As directed    Call MD for:  redness,  tenderness, or signs of infection (pain, swelling, redness, odor or green/yellow discharge around incision site)   Complete by: As directed    Call MD for:  severe uncontrolled pain   Complete by: As directed    Call MD for:  temperature >100.4   Complete by: As directed    Diet - low sodium heart healthy   Complete by: As directed    Driving Restrictions   Complete by: As directed    2 weeks   Increase activity slowly   Complete by: As directed    Lifting restrictions   Complete by: As directed    No more than 10 pounds for 6 weeks   No wound care   Complete by: As directed    Sexual Activity Restrictions   Complete by: As directed    No intercourse, nothing in vagina, no sitting in a bath for 8 weeks     Allergies as of 06/14/2020   No Known Allergies     Medication List    TAKE these medications   acetaminophen 500 MG tablet Commonly known as: TYLENOL Take 2 tablets (1,000 mg total) by mouth every 6 (six) hours.   busPIRone 30 MG tablet Commonly known as: BUSPAR Take 30 mg by mouth at bedtime.   cloNIDine 0.2 MG tablet Commonly known as: CATAPRES Take 0.2 mg by mouth at bedtime.   doxepin 75 MG capsule Commonly known as: SINEQUAN Take 75 mg by mouth at bedtime.   FLUoxetine 10 MG tablet Commonly known as: PROZAC Take 10  mg by mouth at bedtime.   HYDROcodone-acetaminophen 5-325 MG tablet Commonly known as: NORCO/VICODIN Take 2 tablets by mouth every 6 (six) hours as needed for up to 5 days for moderate pain.   ibuprofen 800 MG tablet Commonly known as: ADVIL Take 1 tablet (800 mg total) by mouth every 6 (six) hours.       Follow-up Information    Azucena Fallen, MD Follow up in 2 week(s).   Specialty: Obstetrics and Gynecology Contact information: [place] [address] [phone]               Signed: Elveria Royals 06/14/2020, 8:14 AM

## 2020-06-14 NOTE — Progress Notes (Signed)
1 Day Post-Op Procedure(s) (LRB): XI ROBOTIC ASSISTED TOTAL HYSTERECTOMY (N/A)  Subjective: Patient reports lower belly pain like she did after ablation. Eating regular diet, large breakfast since very hungry. Passed flatus, no bm. Voiding well. Pain controlled with 2 Percocets every 4 hours. No SOB/ CP/ n/v/ vag bleeding   Objective: I have reviewed patient's .vital signs, intake and output, medications and CBC  Patient Vitals for the past 24 hrs:  BP Temp Temp src Pulse Resp SpO2 Height Weight  06/14/20 0644 111/65 98.1 F (36.7 C) - 76 16 100 % - -  06/14/20 0208 (!) 105/59 98.3 F (36.8 C) - 78 18 96 % - -  06/13/20 2125 117/61 98.4 F (36.9 C) - 84 16 96 % - -  06/13/20 1753 126/76 97.9 F (36.6 C) - 70 17 100 % - -  06/13/20 1725 124/90 - - 75 18 100 % - -  06/13/20 1659 (!) 150/102 97.9 F (36.6 C) - 80 20 100 % - -  06/13/20 1647 (!) 124/94 - - 70 11 99 % - -  06/13/20 1645 (!) 123/100 - - 69 13 100 % - -  06/13/20 1630 116/88 - - 70 17 97 % - -  06/13/20 1615 115/82 - - 67 10 100 % - -  06/13/20 1604 110/80 (!) 97.5 F (36.4 C) - 65 16 99 % - -  06/13/20 1144 (!) 120/92 99.3 F (37.4 C) Oral 82 18 99 % 5\' 7"  (1.702 m) 117.2 kg   General: alert and cooperative Resp: clear to auscultation bilaterally Cardio: regular rate and rhythm, S1, S2 normal, no murmur, click, rub or gallop GI: soft, non-tender; bowel sounds normal; no masses,  no organomegaly, normal findings: soft, non-tender and incision: clean, dry and intact Extremities: extremities normal, atraumatic, no cyanosis or edema Vaginal Bleeding: none  Assessment: s/p Procedure(s) with comments: XI ROBOTIC ASSISTED TOTAL HYSTERECTOMY (N/A) - Requests 3hrs.: stable, progressing well, tolerating diet and eating and voiding and pain controlled, Discharge home  Plan: Discharge home, f/up in 2 weeks with Dr Benjie Karvonen, warning s/s and restrictions and after-hr service info d/w pt   LOS: 0 days    Elveria Royals 06/14/2020, 7:01 AM

## 2020-06-14 NOTE — Discharge Instructions (Signed)
Laparoscopic Hysterectomy, Care After The following information offers guidance on how to care for yourself after your procedure. Your health care provider may also give you more specific instructions. If you have problems or questions, contact your health care provider. What can I expect after the procedure? After the procedure, it is common to have:  Soreness and numbness in your incision areas.  Abdominal pain. You will be given pain medicine to control it.  Vaginal bleeding and discharge. You will need to use a sanitary pad after this procedure.  Tiredness (fatigue).  Poor appetite.  Less interest in sex.  Feelings of sadness or other emotions. If your ovaries were also removed, it is common to have symptoms of menopause, such as hot flashes, night sweats, and lack of sleep (insomnia). Follow these instructions at home: Medicines  Take over-the-counter and prescription medicines only as told by your health care provider.  Do not take aspirin or NSAIDs, such as ibuprofen. These medicines can cause bleeding.  Ask your health care provider if the medicine prescribed to you: ? Requires you to avoid driving or using heavy machinery. ? Can cause constipation. You may need to take these actions to prevent or treat constipation:  Drink enough fluid to keep your urine pale yellow.  Take over-the-counter or prescription medicines.  Eat foods that are high in fiber, such as beans, whole grains, and fresh fruits and vegetables.  Limit foods that are high in fat and processed sugars, such as fried or sweet foods. Incision care  Follow instructions from your health care provider about how to take care of your incisions. Make sure you: ? Wash your hands with soap and water for at least 20 seconds before and after you change your bandage (dressing). If soap and water are not available, use hand sanitizer. ? Change your dressing as told by your health care provider. ? Leave stitches  (sutures), skin glue, or adhesive strips in place. These skin closures may need to stay in place for 2 weeks or longer. If adhesive strip edges start to loosen and curl up, you may trim the loose edges. Do not remove adhesive strips completely unless your health care provider tells you to do that.  Check your incision areas every day for signs of infection. Check for: ? More redness, swelling, or pain. ? Fluid or blood. ? Warmth. ? Pus or a bad smell.   Activity  Rest as told by your healthcare provider.  Return to your normal activities as told by your health care provider. Ask your health care provider what activities are safe for you.  Avoid sitting for a long time without moving. Get up to take short walks every 1-2 hours. This is important to improve blood flow and breathing. Ask for help if you feel weak or unsteady.  Do not lift anything that is heavier than 10 lb (4.5 kg), or the limit that you are told, until your health care provider says that it is safe.  If you were given a sedative during the procedure, it can affect you for several hours. Do not drive or operate machinery until your health care provider says that it is safe.   Lifestyle  Do not use any products that contain nicotine or tobacco. These products include cigarettes, chewing tobacco, and vaping devices, such as e-cigarettes. These can delay healing after surgery. If you need help quitting, ask your health care provider.  Do not drink alcohol until your health care provider approves. General instructions  Do not douche, use tampons, or have sex for at least 6 weeks, or as told by your health care provider.  If you struggle with physical or emotional changes after your procedure, speak with your health care provider or a therapist.  Do not take baths, swim, or use a hot tub until your health care provider approves. You may only be allowed to take showers for 2-3 weeks.  Keep your dressing dry until your health  care provider says it can be removed.  Try to have someone at home with you for the first 1-2 weeks to help with your daily chores.  Wear compression stockings as told by your health care provider. These stockings help to prevent blood clots and reduce swelling in your legs.  Keep all follow-up visits. This is important.   Contact a health care provider if:  You have any of these signs of infection: ? More redness, swelling, warmth, or pain around an incision. ? Fluid or blood coming from an incision. ? Pus or a bad smell coming from an incision. ? Chills or a fever.  An incision opens.  Your pain medicine is not helping.  You feel dizzy or light-headed.  You have pain or bleeding when you urinate.  You have nausea and vomiting that does not go away.  You have pus, or a bad-smelling discharge coming from your vagina. Get help right away if:  You have a fever and your symptoms suddenly get worse.  You have severe abdominal pain.  You have chest pain.  You have shortness of breath.  You faint.  You have pain, swelling, or redness in your leg.  You have heavy vaginal bleeding and blood clots, soaking through a sanitary pad in less than 1 hour. These symptoms may represent a serious problem that is an emergency. Do not wait to see if the symptoms will go away. Get medical help right away. Call your local emergency services (911 in the [place] Do not drive yourself to the hospital. Summary  After the procedure, it is common to have abdominal pain and vaginal bleeding.  Wear a sanitary pad for vaginal discharge or bleeding.  You should not drive or lift heavy objects until your health care provider says that it is safe.  Contact your health care provider if you have any symptoms of infection, heavy vaginal bleeding, nausea, vomiting, or shortness of breath. This information is not intended to replace advice given to you by your health care provider. Make sure you discuss  any questions you have with your health care provider. Document Revised: 11/18/2019 Document Reviewed: 11/18/2019 Elsevier Patient Education  [place].

## 2020-06-15 LAB — SURGICAL PATHOLOGY

## 2021-01-17 DIAGNOSIS — G4700 Insomnia, unspecified: Secondary | ICD-10-CM | POA: Diagnosis not present

## 2021-01-17 DIAGNOSIS — F3342 Major depressive disorder, recurrent, in full remission: Secondary | ICD-10-CM | POA: Diagnosis not present

## 2021-05-07 ENCOUNTER — Encounter: Payer: Self-pay | Admitting: Gastroenterology

## 2021-05-28 ENCOUNTER — Ambulatory Visit: Payer: Self-pay | Admitting: Gastroenterology

## 2021-05-29 ENCOUNTER — Other Ambulatory Visit: Payer: Self-pay

## 2021-05-29 ENCOUNTER — Ambulatory Visit (INDEPENDENT_AMBULATORY_CARE_PROVIDER_SITE_OTHER): Payer: Commercial Managed Care - PPO | Admitting: Medical

## 2021-05-29 ENCOUNTER — Ambulatory Visit (HOSPITAL_BASED_OUTPATIENT_CLINIC_OR_DEPARTMENT_OTHER)
Admission: RE | Admit: 2021-05-29 | Discharge: 2021-05-29 | Disposition: A | Discharge status: Home / Self Care | Payer: Commercial Managed Care - PPO | Source: Ambulatory Visit | Attending: Medical | Admitting: Medical

## 2021-05-29 VITALS — BP 109/79 | HR 88 | Resp 18 | Ht 67.0 in | Wt 249.0 lb

## 2021-05-29 DIAGNOSIS — R059 Cough, unspecified: Secondary | ICD-10-CM

## 2021-05-29 DIAGNOSIS — F17200 Nicotine dependence, unspecified, uncomplicated: Secondary | ICD-10-CM | POA: Diagnosis not present

## 2021-05-29 DIAGNOSIS — R062 Wheezing: Secondary | ICD-10-CM | POA: Diagnosis not present

## 2021-05-29 DIAGNOSIS — J4520 Mild intermittent asthma, uncomplicated: Secondary | ICD-10-CM | POA: Diagnosis not present

## 2021-05-29 MED ORDER — METHYLPREDNISOLONE 4 MG PO TABS: ORAL_TABLET | ORAL | 0 refills | Status: DC

## 2021-05-29 MED ORDER — ALBUTEROL SULFATE HFA 108 (90 BASE) MCG/ACT IN AERS: 2.0000 | INHALATION_SPRAY | Freq: Four times a day (QID) | RESPIRATORY_TRACT | 0 refills | Status: DC | PRN

## 2021-05-29 MED ORDER — BUDESONIDE-FORMOTEROL FUMARATE 160-4.5 MCG/ACT IN AERO: 2.0000 | INHALATION_SPRAY | Freq: Two times a day (BID) | RESPIRATORY_TRACT | 12 refills | Status: DC

## 2021-05-29 NOTE — Addendum Note (Signed)
Addended by: Anabel Halon on: 05/29/2021 10:26 AM ? ? Modules accepted: Level of Service ? ?

## 2021-05-29 NOTE — Patient Instructions (Addendum)
Asthma flare. Better today. Can use symbicort daiy and neb tx albuterol if needed. ? ?If wheezing persist toward weekend then add on medrol dose pack. ? ?Cxr today. ? ?Recommend stop smoking if possible.  ? ?Follow up in 10 days or sooner if needed. ?

## 2021-05-29 NOTE — Progress Notes (Signed)
? ?Subjective:  ? ? Patient ID: Angelica Diaz, female    DOB: 05-11-1980, 41 y.o.   MRN: [id number] ? ?HPI ?Pt state late Sunday she started to get wheezing and shortness of breath. Last 2 days using neb treatment twice a day. But today has not used. Pt states typically she will get bronchitis like illness and productive cough then start to wheeze. ? ?Over past 2 years no asthma flares.  ? ?Pt states recently no preceding viral like illness preceding. No fever,no chills or sweats. ? ?Pt denies any ashtma flares during spring. ? ?Yesterday her house was 80 degrees. This made her cough and seemed to make her wheeze. ? ?No leg pain. ? ?When she uses neb she states helps a lot/stopped the wheezing. ? ?Smoker- on and off for past 20 year. Pack recently last 3 days.started 41 yo. On average half pack a day smoker over the years. ? ? ?Review of Systems  ?Constitutional:  Negative for chills, fatigue and fever.  ?HENT:  Negative for congestion.   ?Respiratory:  Positive for cough, shortness of breath and wheezing. Negative for chest tightness.   ?     Mild dyspnea.  ?Cardiovascular:  Negative for chest pain and palpitations.  ?Gastrointestinal:  Negative for abdominal pain.  ?Genitourinary:  Negative for dysuria.  ?Musculoskeletal:  Negative for back pain.  ?Hematological:  Negative for adenopathy. Does not bruise/bleed easily.  ?Psychiatric/Behavioral:  Negative for behavioral problems, decreased concentration and hallucinations.   ? ?Past Medical History:  ?Diagnosis Date  ? Anxiety   ? Arthritis   ? Chronic headaches   ? Depression   ? History of 2019 novel coronavirus disease (COVID-19) 03/10/2019  ? per pt asymptomatic was exposed ,  results in care everywhere  ? History of asthma   ? as teen  ? Insomnia   ? 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 patel  (MRI 11-06-2018 in epic)  ? Menorrhagia   ? Migraine   ? Wears contact lenses   ? ?  ?Social History  ? ?Socioeconomic  History  ? Marital status: Divorced  ?  Spouse name: Not on file  ? Number of children: 2  ? Years of education: Not on file  ? Highest education level: Not on file  ?Occupational History  ? Occupation: Medical illustrator  ?  Employer: [employer]   ?Tobacco Use  ? Smoking status: Every Day  ?  Years: 8.00  ?  Types: Cigarettes  ? Smokeless tobacco: Never  ? Tobacco comments:  ?  03-16-2020 per pt 7-8 cig per day  ?Vaping Use  ? Vaping Use: Never used  ?Substance and Sexual Activity  ? Alcohol use: Yes  ?  Alcohol/week: 0.0 standard drinks  ?  Comment: socially   ? Drug use: Never  ? Sexual activity: Not on file  ?Other Topics Concern  ? Not on file  ?Social History Narrative  ? Unemployed.  ?   ? Applying for disability.  ?   ? 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  She has two healthy children.  ?   ? Lives in single story home.  ?   ? Right handed.  ?   ? Highest level of edu- highschool  ?   ? ?Social Determinants of Health  ? ?Financial Resource Strain: Not on file  ?Food Insecurity: Not on file  ?Transportation Needs: Not on file  ?Physical Activity: Not on file  ?Stress: Not on file  ?  Social Connections: Not on file  ?Intimate Partner Violence: Not on file  ? ? ?Past Surgical History:  ?Procedure Laterality Date  ? [place]  ? CHOLECYSTECTOMY N/A 05/27/2013  ? Procedure: LAPAROSCOPIC CHOLECYSTECTOMY WITH INTRAOPERATIVE CHOLANGIOGRAM;  Surgeon: Earnstine Regal, MD;  Location: [facility];  Service: General;  Laterality: N/A;  ? dental implants  2018  ? upper  ? DILATION AND CURETTAGE OF UTERUS  yrs ago  ? DILITATION & CURRETTAGE/HYSTROSCOPY WITH NOVASURE ABLATION N/A 03/20/2020  ? Procedure: DILATATION & CURETTAGE/HYSTEROSCOPY WITH NOVASURE ABLATION;  Surgeon: Azucena Fallen, MD;  Location: [facility];  Service: Gynecology;  Laterality: N/A;  ? fallopian tubes removed  2018  ? LAPAROSCOPIC BILATERAL SALPINGECTOMY  2018  ? ROBOTIC ASSISTED TOTAL HYSTERECTOMY N/A 06/13/2020  ? Procedure: XI ROBOTIC  ASSISTED TOTAL HYSTERECTOMY;  Surgeon: Azucena Fallen, MD;  Location: [facility];  Service: Gynecology;  Laterality: N/A;  Requests 3hrs.  ? WISDOM TOOTH EXTRACTION  yrs ago  ? ? ?Family History  ?Problem Relation Age of Onset  ? Migraines Mother   ? Diabetes Maternal Grandfather   ? Migraines Son   ? Colon cancer Neg Hx   ? Throat cancer Neg Hx   ? Pancreatic cancer Neg Hx   ? Stomach cancer Neg Hx   ? Heart disease Neg Hx   ? Kidney disease Neg Hx   ? Liver disease Neg Hx   ? ? ?No Known Allergies ? ?Current Outpatient Medications on File Prior to Visit  ?Medication Sig Dispense Refill  ? acetaminophen (TYLENOL) 500 MG tablet Take 2 tablets (1,000 mg total) by mouth every 6 (six) hours. 30 tablet 0  ? busPIRone (BUSPAR) 30 MG tablet Take 30 mg by mouth at bedtime.    ? cloNIDine (CATAPRES) 0.2 MG tablet Take 0.2 mg by mouth at bedtime.    ? doxepin (SINEQUAN) 75 MG capsule Take 75 mg by mouth at bedtime.    ? FLUoxetine (PROZAC) 10 MG tablet Take 10 mg by mouth at bedtime.    ? ibuprofen (ADVIL) 800 MG tablet Take 1 tablet (800 mg total) by mouth every 6 (six) hours. 30 tablet 0  ? ?No current facility-administered medications on file prior to visit.  ? ? ?BP 109/79   Pulse 88   Resp 18   Ht 5\' 7"  (1.702 m)   Wt 249 lb (112.9 kg)   SpO2 99%   BMI 39.00 kg/m?  ?  ?   ?Objective:  ? Physical Exam ? ? ?General ?Mental Status- Alert. General Appearance- Not in acute distress.  ? ?Skin ?General: Color- Normal Color. Moisture- Normal Moisture. ? ?Neck ?Carotid Arteries- Normal color. Moisture- Normal Moisture. No carotid bruits. No JVD. No tracheal deviation. ? ?Chest and Lung Exam ?Auscultation: ?Breath Sounds:-Normal. ? ?Cardiovascular ?Auscultation:Rythm- Regular. ?Murmurs & Other Heart Sounds:Auscultation of the heart reveals- No Murmurs. ? ?Abdomen ?Inspection:-Inspeection Normal. ?Palpation/Percussion:Note:No mass. Palpation and Percussion of the abdomen reveal- Non Tender, Non Distended +  BS, no rebound or guarding. ? ? ? ?Neurologic ?Cranial Nerve exam:- CN III-XII intact(No nystagmus), symmetric smile. ?Strength:- 5/5 equal and symmetric strength both upper and lower extremities.  ? ? ?Lower ext- calfs symmetirc. Negative homans signs. ?   ?Assessment & Plan:  ? ?Patient Instructions  ?Asthma flare. Better today. Can use symbicort daiy and neb tx albuterol if needed. ? ?If wheezing persist toward weekend then add on medrol dose pack. ? ?Cxr today. ? ?Recommend stop smoking if possible.  ? ?  Follow up in 10 days or sooner if needed.  ? ?Mackie Pai, PA-C  ?

## 2021-05-31 ENCOUNTER — Encounter: Payer: Self-pay | Admitting: Medical

## 2021-05-31 MED ORDER — HYDROCODONE BIT-HOMATROP MBR 5-1.5 MG/5ML PO SOLN: 5.0000 mL | Freq: Three times a day (TID) | ORAL | 0 refills | Status: DC | PRN

## 2021-05-31 MED ORDER — AZITHROMYCIN 250 MG PO TABS
ORAL_TABLET | ORAL | 0 refills | Status: AC
Start: 2021-05-31 — End: 2021-06-05

## 2021-05-31 NOTE — Addendum Note (Signed)
Addended by: Anabel Halon on: 05/31/2021 11:36 AM ? ? Modules accepted: Orders ? ?

## 2021-06-04 ENCOUNTER — Telehealth: Payer: Self-pay | Admitting: Medical

## 2021-06-04 ENCOUNTER — Encounter: Payer: Self-pay | Admitting: Medical

## 2021-06-04 ENCOUNTER — Ambulatory Visit (INDEPENDENT_AMBULATORY_CARE_PROVIDER_SITE_OTHER): Payer: Commercial Managed Care - PPO | Admitting: Medical

## 2021-06-04 VITALS — BP 130/90 | HR 90 | Temp 98.5°F | Resp 18 | Ht 67.0 in | Wt 248.2 lb

## 2021-06-04 DIAGNOSIS — R059 Cough, unspecified: Secondary | ICD-10-CM | POA: Diagnosis not present

## 2021-06-04 DIAGNOSIS — M79609 Pain in unspecified limb: Secondary | ICD-10-CM | POA: Diagnosis not present

## 2021-06-04 DIAGNOSIS — R0602 Shortness of breath: Secondary | ICD-10-CM | POA: Diagnosis not present

## 2021-06-04 DIAGNOSIS — J40 Bronchitis, not specified as acute or chronic: Secondary | ICD-10-CM | POA: Diagnosis not present

## 2021-06-04 DIAGNOSIS — R062 Wheezing: Secondary | ICD-10-CM

## 2021-06-04 MED ORDER — METHYLPREDNISOLONE ACETATE 40 MG/ML IJ SUSP
40.0000 mg | Freq: Once | INTRAMUSCULAR | Status: AC
  Administered 2021-06-04: 40 mg via INTRAMUSCULAR

## 2021-06-04 MED ORDER — HYDROCOD POLI-CHLORPHE POLI ER 10-8 MG/5ML PO SUER: 5.0000 mL | Freq: Two times a day (BID) | ORAL | 0 refills | Status: DC | PRN

## 2021-06-04 MED ORDER — PREDNISONE 10 MG (21) PO TBPK: ORAL_TABLET | ORAL | 0 refills | Status: DC

## 2021-06-04 NOTE — Telephone Encounter (Signed)
Can you get prior auth for ct angio. Order placed. ?

## 2021-06-04 NOTE — Telephone Encounter (Signed)
Per [facility] CT angio doesn't need a pre-cert ?

## 2021-06-04 NOTE — Progress Notes (Signed)
? ?Subjective:  ? ? Patient ID: Angelica Diaz, female    DOB: 05-13-1980, 41 y.o.   MRN: [id number] ? ?HPI ? ?Pt states she is not any better. She states her cough feels worse. She feels like she is gasping for air at times. ? ?Her throat is very sore. Hurts to swallow.Pt has taking azithromycin,  ? ? ?Pt has been on azithromycin, symbicort  hycodan, has used neb tx and just finished tape medrol.  ? ?She states symbicort makes her cough.  ? ? ?Cxr showed  ? ?IMPRESSION: ?Mild central bronchial wall thickening suggesting bronchitis. No ?focal consolidation or pleural effusion. ? ? ?Pt feels like she is gasping for breath after coughing episodes. Her 02 sat is 99%. ? ?Pt does smoke. Cut back less tahn one pack in one week. Smoked for 24 years. ? ?Pt has + homans signs today bilateral. Did not have on last office visit. Pt notes after exam.  ? ? ?Review of Systems  ?Constitutional:  Negative for chills, fatigue and fever.  ?Respiratory:  Positive for cough, shortness of breath and wheezing. Negative for chest tightness.   ?Cardiovascular:  Negative for chest pain and palpitations.  ?Gastrointestinal:  Negative for abdominal pain.  ?Musculoskeletal:  Negative for back pain.  ?Neurological:  Negative for dizziness, syncope and weakness.  ?Psychiatric/Behavioral:  Negative for behavioral problems, confusion and sleep disturbance. The patient is not nervous/anxious.   ? ? ?Past Medical History:  ?Diagnosis Date  ? Anxiety   ? Arthritis   ? Chronic headaches   ? Depression   ? History of 2019 novel coronavirus disease (COVID-19) 03/10/2019  ? per pt asymptomatic was exposed ,  results in care everywhere  ? History of asthma   ? as teen  ? Insomnia   ? 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 patel  (MRI 11-06-2018 in epic)  ? Menorrhagia   ? Migraine   ? Wears contact lenses   ? ?  ?Social History  ? ?Socioeconomic History  ? Marital status: Divorced  ?  Spouse name: Not on file  ?  Number of children: 2  ? Years of education: Not on file  ? Highest education level: Not on file  ?Occupational History  ? Occupation: Medical illustrator  ?  Employer: [employer]   ?Tobacco Use  ? Smoking status: Every Day  ?  Years: 8.00  ?  Types: Cigarettes  ? Smokeless tobacco: Never  ? Tobacco comments:  ?  03-16-2020 per pt 7-8 cig per day  ?Vaping Use  ? Vaping Use: Never used  ?Substance and Sexual Activity  ? Alcohol use: Yes  ?  Alcohol/week: 0.0 standard drinks  ?  Comment: socially   ? Drug use: Never  ? Sexual activity: Not on file  ?Other Topics Concern  ? Not on file  ?Social History Narrative  ? Unemployed.  ?   ? Applying for disability.  ?   ? 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  She has two healthy children.  ?   ? Lives in single story home.  ?   ? Right handed.  ?   ? Highest level of edu- highschool  ?   ? ?Social Determinants of Health  ? ?Financial Resource Strain: Not on file  ?Food Insecurity: Not on file  ?Transportation Needs: Not on file  ?Physical Activity: Not on file  ?Stress: Not on file  ?Social Connections: Not on file  ?Intimate Partner Violence: Not  on file  ? ? ?Past Surgical History:  ?Procedure Laterality Date  ? [place]  ? CHOLECYSTECTOMY N/A 05/27/2013  ? Procedure: LAPAROSCOPIC CHOLECYSTECTOMY WITH INTRAOPERATIVE CHOLANGIOGRAM;  Surgeon: Earnstine Regal, MD;  Location: [facility];  Service: General;  Laterality: N/A;  ? dental implants  2018  ? upper  ? DILATION AND CURETTAGE OF UTERUS  yrs ago  ? DILITATION & CURRETTAGE/HYSTROSCOPY WITH NOVASURE ABLATION N/A 03/20/2020  ? Procedure: DILATATION & CURETTAGE/HYSTEROSCOPY WITH NOVASURE ABLATION;  Surgeon: Azucena Fallen, MD;  Location: [facility];  Service: Gynecology;  Laterality: N/A;  ? fallopian tubes removed  2018  ? LAPAROSCOPIC BILATERAL SALPINGECTOMY  2018  ? ROBOTIC ASSISTED TOTAL HYSTERECTOMY N/A 06/13/2020  ? Procedure: XI ROBOTIC ASSISTED TOTAL HYSTERECTOMY;  Surgeon: Azucena Fallen, MD;  Location:  [facility];  Service: Gynecology;  Laterality: N/A;  Requests 3hrs.  ? WISDOM TOOTH EXTRACTION  yrs ago  ? ? ?Family History  ?Problem Relation Age of Onset  ? Migraines Mother   ? Diabetes Maternal Grandfather   ? Migraines Son   ? Colon cancer Neg Hx   ? Throat cancer Neg Hx   ? Pancreatic cancer Neg Hx   ? Stomach cancer Neg Hx   ? Heart disease Neg Hx   ? Kidney disease Neg Hx   ? Liver disease Neg Hx   ? ? ?No Known Allergies ? ?Current Outpatient Medications on File Prior to Visit  ?Medication Sig Dispense Refill  ? acetaminophen (TYLENOL) 500 MG tablet Take 2 tablets (1,000 mg total) by mouth every 6 (six) hours. 30 tablet 0  ? albuterol (VENTOLIN HFA) 108 (90 Base) MCG/ACT inhaler Inhale 2 puffs into the lungs every 6 (six) hours as needed. 18 g 0  ? azithromycin (ZITHROMAX) 250 MG tablet Take 2 tablets on day 1, then 1 tablet daily on days 2 through 5 6 tablet 0  ? budesonide-formoterol (SYMBICORT) 160-4.5 MCG/ACT inhaler Inhale 2 puffs into the lungs 2 (two) times daily. 1 each 12  ? busPIRone (BUSPAR) 30 MG tablet Take 30 mg by mouth at bedtime.    ? cloNIDine (CATAPRES) 0.2 MG tablet Take 0.2 mg by mouth at bedtime.    ? doxepin (SINEQUAN) 75 MG capsule Take 75 mg by mouth at bedtime.    ? FLUoxetine (PROZAC) 10 MG tablet Take 10 mg by mouth at bedtime.    ? ibuprofen (ADVIL) 800 MG tablet Take 1 tablet (800 mg total) by mouth every 6 (six) hours. 30 tablet 0  ? methylPREDNISolone (MEDROL) 4 MG tablet Standard 6 day taper dose. 21 tablet 0  ? ?No current facility-administered medications on file prior to visit.  ? ? ?BP 130/90   Pulse 90   Temp 98.5 ?F (36.9 ?C)   Resp 18   Ht '5\' 7"'$  (1.702 m)   Wt 248 lb 3.2 oz (112.6 kg)   LMP 05/22/2020   SpO2 99%   BMI 38.87 kg/m?  ?  ?   ?Objective:  ? Physical Exam ? ?General ?Mental Status- Alert. General Appearance- Not in acute distress.  ? ?Skin ?General: Color- Normal Color. Moisture- Normal Moisture. ? ?Neck ?Carotid Arteries- Normal  color. Moisture- Normal Moisture. No carotid bruits. No JVD. ? ?Chest and Lung Exam ?Auscultation: ?Breath Sounds:-Normal. ? ?Cardiovascular ?Auscultation:Rythm- Regular. ?Murmurs & Other Heart Sounds:Auscultation of the heart reveals- No Murmurs. ? ?Abdomen ?Inspection:-Inspeection Normal. ?Palpation/Percussion:Note:No mass. Palpation and Percussion of the abdomen reveal- Non Tender, Non Distended + BS, no  rebound or guarding. ? ? ? ?Neurologic ?Cranial Nerve exam:- CN III-XII intact(No nystagmus), symmetric smile. ?0Strength:- 5/5 equal and symmetric strength both upper and lower extremities.  ? ? ?   ?Assessment & Plan:  ? ?Patient Instructions  ?Recent shortness of breath, wheezing and bronchitis like signs and symptoms.  X-ray of chest also showed bronchitis type changes.  24-year history of smoking.  Recent symptoms persisting despite use of azithromycin, Symbicort, nebulizer treatment, Hycodan and Medrol dose. ? ?You have good O2 sat level today but you do have bilateral popliteal pain on exam.  Also reporting at times you feel like gasping for air.  We will go ahead and order bilateral lower extremity ultrasounds.  Also placing order for CT chest to rule out pulmonary embolism.  The CT will need to be prior authorized.  I will send message to staff to get that done.  Pending CT authorization if her signs symptoms worsen or change then recommend ED evaluation. ? ?We will give Depo-Medrol 40 mg IM injection today.  6-day taper dose of prednisone.  Will try Tussionex cough medicine. ? ?If ultrasound and CT chest negative and your symptoms are persisting despite the above measures then will refer to pulmonologist. ? ?Follow-up in 7 days or sooner if needed ? ?Mackie Pai, PA-C  ?

## 2021-06-04 NOTE — Patient Instructions (Addendum)
Recent shortness of breath, wheezing and bronchitis like signs and symptoms.  X-ray of chest also showed bronchitis type changes.  24-year history of smoking.  Recent symptoms persisting despite use of azithromycin, Symbicort, nebulizer treatment, Hycodan and Medrol dose. ? ?You have good O2 sat level today but you do have bilateral popliteal pain on exam.  Also reporting at times you feel like gasping for air.  We will go ahead and order bilateral lower extremity ultrasounds.  Also placing order for CT chest to rule out pulmonary embolism.  The CT will need to be prior authorized.  I will send message to staff to get that done.  Pending CT authorization if her signs symptoms worsen or change then recommend ED evaluation. ? ?We will give Depo-Medrol 40 mg IM injection today.  6-day taper dose of prednisone.  Will try Tussionex cough medicine. ? ?If ultrasound and CT chest negative and your symptoms are persisting despite the above measures then will refer to pulmonologist. ? ?Follow-up in 7 days or sooner if needed ?

## 2021-06-05 ENCOUNTER — Ambulatory Visit (HOSPITAL_BASED_OUTPATIENT_CLINIC_OR_DEPARTMENT_OTHER)
Admission: RE | Admit: 2021-06-05 | Discharge: 2021-06-05 | Disposition: A | Discharge status: Home / Self Care | Payer: Commercial Managed Care - PPO | Source: Ambulatory Visit | Attending: Medical | Admitting: Medical

## 2021-06-05 ENCOUNTER — Other Ambulatory Visit: Payer: Self-pay

## 2021-06-05 DIAGNOSIS — M79609 Pain in unspecified limb: Secondary | ICD-10-CM | POA: Diagnosis not present

## 2021-06-10 ENCOUNTER — Telehealth (HOSPITAL_BASED_OUTPATIENT_CLINIC_OR_DEPARTMENT_OTHER): Payer: Self-pay

## 2021-06-10 ENCOUNTER — Encounter: Payer: Self-pay | Admitting: Medical

## 2021-06-10 ENCOUNTER — Telehealth: Payer: Self-pay | Admitting: Medical

## 2021-06-10 MED ORDER — ALBUTEROL SULFATE HFA 108 (90 BASE) MCG/ACT IN AERS: 2.0000 | INHALATION_SPRAY | Freq: Four times a day (QID) | RESPIRATORY_TRACT | 0 refills | Status: DC | PRN

## 2021-06-10 MED ORDER — ALBUTEROL SULFATE (2.5 MG/3ML) 0.083% IN NEBU: 2.5000 mg | INHALATION_SOLUTION | Freq: Four times a day (QID) | RESPIRATORY_TRACT | 2 refills | Status: DC | PRN

## 2021-06-10 NOTE — Telephone Encounter (Signed)
I had placed  ct angio order last week. Did it get piror authorized. Pt has not gotten call from radiology? ?

## 2021-06-10 NOTE — Addendum Note (Signed)
Addended by: Anabel Halon on: 06/10/2021 09:29 AM ? ? Modules accepted: Orders ? ?

## 2021-06-10 NOTE — Addendum Note (Signed)
Addended by: Anabel Halon on: 06/10/2021 09:02 AM ? ? Modules accepted: Orders ? ?

## 2021-06-17 ENCOUNTER — Encounter: Payer: Self-pay | Admitting: Gastroenterology

## 2021-06-17 ENCOUNTER — Ambulatory Visit: Payer: Commercial Managed Care - PPO | Admitting: Gastroenterology

## 2021-06-17 VITALS — BP 110/76 | Ht 67.5 in | Wt 241.8 lb

## 2021-06-17 DIAGNOSIS — R1084 Generalized abdominal pain: Secondary | ICD-10-CM

## 2021-06-17 DIAGNOSIS — K5900 Constipation, unspecified: Secondary | ICD-10-CM

## 2021-06-17 DIAGNOSIS — Z800 Family history of malignant neoplasm of digestive organs: Secondary | ICD-10-CM

## 2021-06-17 MED ORDER — DICYCLOMINE HCL 20 MG PO TABS
20.0000 mg | ORAL_TABLET | Freq: Four times a day (QID) | ORAL | 3 refills | Status: DC
Start: 2021-06-17 — End: 2021-06-26

## 2021-06-17 MED ORDER — SUTAB 1479-225-188 MG PO TABS: 1.0000 | ORAL_TABLET | Freq: Once | ORAL | 0 refills | Status: AC

## 2021-06-17 NOTE — Progress Notes (Signed)
? ?HPI : Angelica Diaz is a pleasant 41 year old female with a history of depression, bipolar disorder, and family history of colon cancer who is referred to [place] by Mackie Pai, PA-C for a change in bowel habits and worsening abdominal pain.  She reports that her symptoms started abruptly 3 months ago.  She describes episodes of sudden onset 'horrendous' abdominal pain associated with the urge to defecate.  She will try to defecate but often cannot.  The pain is often associated with nausea and diaphoresis/sweating.  This pain will typically persist until she is able to have a bowel movement and sometimes lasts for hours.  The abdominal pain is not well localized and seems to involve her entire abdomen.  Her stool is typically formed and solid.  No blood in the stool.  Her weight is stable. ?She has tried taking Gas-X, Senna-kot, and Fleets enemas to help pain and promote a bowel movement.  The senna works, but typically does not produce a bowel movement for several hours, or the next day. ?She reports having regular bowel movements prior to 3 months ago and denies any chronic problems with abdominal pain.  She was seen in the GI clinic for persistent epigastric pain following her cholecystectomy in 2015.  This pain eventually resolved spontaneously and did not return. ?She denies any changes in her diet, medications or lifestyle that accompanied her new symptoms.  She did start a new job in November, but says that this job is an improvement for her and she likes it a lot. ?Both her maternal grandparents and a maternal uncle (44s) were diagnosed with colon cancer. Her mother has had colonoscopies, unknown if had polyps. ? ?Past Medical History:  ?Diagnosis Date  ? Anxiety   ? Arthritis   ? Chronic headaches   ? Depression   ? History of 2019 novel coronavirus disease (COVID-19) 03/10/2019  ? per pt asymptomatic was exposed ,  results in care everywhere  ? History of asthma   ? as teen  ? Insomnia   ? 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 patel  (MRI 11-06-2018 in epic)  ? Menorrhagia   ? Migraine   ? Wears contact lenses   ? ? ? ?Past Surgical History:  ?Procedure Laterality Date  ? [place]  ? CHOLECYSTECTOMY N/A 05/27/2013  ? Procedure: LAPAROSCOPIC CHOLECYSTECTOMY WITH INTRAOPERATIVE CHOLANGIOGRAM;  Surgeon: Earnstine Regal, MD;  Location: [facility];  Service: General;  Laterality: N/A;  ? dental implants  2018  ? upper  ? DILATION AND CURETTAGE OF UTERUS  yrs ago  ? DILITATION & CURRETTAGE/HYSTROSCOPY WITH NOVASURE ABLATION N/A 03/20/2020  ? Procedure: DILATATION & CURETTAGE/HYSTEROSCOPY WITH NOVASURE ABLATION;  Surgeon: Azucena Fallen, MD;  Location: [facility];  Service: Gynecology;  Laterality: N/A;  ? fallopian tubes removed  2018  ? LAPAROSCOPIC BILATERAL SALPINGECTOMY  2018  ? ROBOTIC ASSISTED TOTAL HYSTERECTOMY N/A 06/13/2020  ? Procedure: XI ROBOTIC ASSISTED TOTAL HYSTERECTOMY;  Surgeon: Azucena Fallen, MD;  Location: [facility];  Service: Gynecology;  Laterality: N/A;  Requests 3hrs.  ? WISDOM TOOTH EXTRACTION  yrs ago  ? ?Family History  ?Problem Relation Age of Onset  ? Migraines Mother   ? Diabetes Maternal Grandfather   ? Migraines Son   ? Colon cancer Neg Hx   ? Throat cancer Neg Hx   ? Pancreatic cancer Neg Hx   ? Stomach cancer Neg Hx   ?  Heart disease Neg Hx   ? Kidney disease Neg Hx   ? Liver disease Neg Hx   ? ?Social History  ? ?Tobacco Use  ? Smoking status: Every Day  ?  Years: 8.00  ?  Types: Cigarettes  ? Smokeless tobacco: Never  ? Tobacco comments:  ?  03-16-2020 per pt 7-8 cig per day  ?Vaping Use  ? Vaping Use: Never used  ?Substance Use Topics  ? Alcohol use: Yes  ?  Alcohol/week: 0.0 standard drinks  ?  Comment: socially   ? Drug use: Never  ? ?Current Outpatient Medications  ?Medication Sig Dispense Refill  ? albuterol (PROVENTIL) (2.5 MG/3ML) 0.083% nebulizer solution Take 3 mLs (2.5 mg total) by  nebulization every 6 (six) hours as needed for wheezing or shortness of breath. 75 mL 2  ? albuterol (VENTOLIN HFA) 108 (90 Base) MCG/ACT inhaler Inhale 2 puffs into the lungs every 6 (six) hours as needed. 18 g 0  ? busPIRone (BUSPAR) 30 MG tablet Take 30 mg by mouth at bedtime.    ? cloNIDine (CATAPRES) 0.2 MG tablet Take 0.6 mg by mouth at bedtime.    ? doxepin (SINEQUAN) 75 MG capsule Take 75 mg by mouth at bedtime.    ? FLUoxetine (PROZAC) 10 MG tablet Take 10 mg by mouth at bedtime.    ? ?No current facility-administered medications for this visit.  ? ?No Known Allergies ? ? ?Review of Systems: ?All systems reviewed and negative except where noted in HPI.  ? ? ?DG Chest 2 View ? ?Result Date: 05/29/2021 ?CLINICAL DATA:  Recent cough, wheezing, shortness of breath EXAM: CHEST - 2 VIEW COMPARISON:  Chest radiograph 06/07/2018 FINDINGS: The cardiomediastinal silhouette is within normal limits. There is mild central bronchial wall thickening. There is no focal consolidation or pulmonary edema. There is no pleural effusion or pneumothorax There is no acute osseous abnormality. IMPRESSION: Mild central bronchial wall thickening suggesting bronchitis. No focal consolidation or pleural effusion. Electronically Signed   By: Valetta Mole M.D.   On: 05/29/2021 10:48  ? ?US Venous Img Lower Bilateral ? ?Result Date: 06/05/2021 ?CLINICAL DATA:  Bilateral lower extremity pain. Homan sign and dyspnea. EXAM: Bilateral LOWER EXTREMITY VENOUS DOPPLER ULTRASOUND TECHNIQUE: Gray-scale sonography with compression, as well as color and duplex ultrasound, were performed to evaluate the deep venous system(s) from the level of the common femoral vein through the popliteal and proximal calf veins. COMPARISON:  None. FINDINGS: VENOUS Normal compressibility of the common femoral, superficial femoral, and popliteal veins, as well as the visualized calf veins. Visualized portions of profunda femoral vein and great saphenous vein unremarkable.  No filling defects to suggest DVT on grayscale or color Doppler imaging. Doppler waveforms show normal direction of venous flow, normal respiratory plasticity and response to augmentation. OTHER None. Limitations: none IMPRESSION: No lower extremity DVT is identified. Electronically Signed   By: Van Clines M.D.   On: 06/05/2021 13:23   ? ?Physical Exam: ?Ht 5' 7.5" (1.715 m)   Wt 241 lb 12.8 oz (109.7 kg)   LMP 05/22/2020   BMI 37.31 kg/m?  ?Constitutional: Pleasant,well-developed, Caucasian female in no acute distress. ?HEENT: Normocephalic and atraumatic. Conjunctivae are normal. No scleral icterus. ?Neck supple.  ?Cardiovascular: Normal rate, regular rhythm.  ?Pulmonary/chest: Effort normal and breath sounds normal. No wheezing, rales or rhonchi. ?Abdominal: Soft, nondistended, nontender. Bowel sounds active throughout. There are no masses palpable. No hepatomegaly. ?Extremities: no edema ?Neurological: Alert and oriented to person place and time. ?Skin:  Skin is warm and dry. No rashes noted. ?Psychiatric: Normal mood and affect. Behavior is normal. ? ?CBC ?   ?Component Value Date/Time  ? WBC 10.5 06/14/2020 0204  ? RBC 3.98 06/14/2020 0204  ? HGB 12.8 06/14/2020 0204  ? HCT 38.4 06/14/2020 0204  ? PLT 240 06/14/2020 0204  ? MCV 96.5 06/14/2020 0204  ? MCH 32.2 06/14/2020 0204  ? MCHC 33.3 06/14/2020 0204  ? RDW 11.9 06/14/2020 0204  ? LYMPHSABS 2.0 02/09/2019 0938  ? MONOABS 0.4 02/09/2019 0938  ? EOSABS 0.1 02/09/2019 0938  ? BASOSABS 0.0 02/09/2019 0938  ? ? ?CMP  ?   ?Component Value Date/Time  ? NA 136 06/11/2020 1313  ? NA 139 05/14/2015 0000  ? K 4.6 06/11/2020 1313  ? CL 105 06/11/2020 1313  ? CO2 22 06/11/2020 1313  ? GLUCOSE 97 06/11/2020 1313  ? BUN 22 (H) 06/11/2020 1313  ? BUN 19 05/14/2015 0000  ? CREATININE 0.83 06/11/2020 1313  ? CREATININE 0.68 03/28/2013 1536  ? CALCIUM 9.3 06/11/2020 1313  ? PROT 6.2 (L) 11/02/2019 0448  ? ALBUMIN 3.5 11/02/2019 0448  ? AST 24 11/02/2019 0448  ? ALT  27 11/02/2019 0448  ? ALKPHOS 72 11/02/2019 0448  ? BILITOT 0.8 11/02/2019 0448  ? GFRNONAA >60 06/11/2020 1313  ? GFRAA >60 11/02/2019 0448  ? ? ? ?ASSESSMENT AND PLAN: ?41 year old female with 3 second degree r

## 2021-06-17 NOTE — Patient Instructions (Signed)
If you are age 43 or older, your body mass index should be between 23-30. Your Body mass index is 37.31 kg/m?Marland Kitchen If this is out of the aforementioned range listed, please consider follow up with your Primary Care Provider. ? ?If you are age 43 or younger, your body mass index should be between 19-25. Your Body mass index is 37.31 kg/m?Marland Kitchen If this is out of the aformentioned range listed, please consider follow up with your Primary Care Provider.  ? ?You have been scheduled for a colonoscopy. Please follow written instructions given to you at your visit today.  ?Please pick up your prep supplies at the pharmacy within the next 1-3 days. ?If you use inhalers (even only as needed), please bring them with you on the day of your procedure. ? ?Please purchase Metamucil over the counter. Take as directed.  ? ?We have sent the following medications to your pharmacy for you to pick up at your convenience: Bentyl 20 mg one tablet every 6 hours as needed for pain. ? ?The [place] [facility] providers would like to encourage you to use [facility] to communicate with providers for non-urgent requests or questions.  Due to long hold times on the telephone, sending your provider a message by [facility] may be a faster and more efficient way to get a response.  Please allow 48 business hours for a response.  Please remember that this is for non-urgent requests.  ? ?It was a pleasure to see you today! ? ?Thank you for trusting me with your gastrointestinal care!   ? ?Scott E.Candis Schatz, MD ? ? ?

## 2021-06-18 ENCOUNTER — Encounter: Payer: Self-pay | Admitting: Gastroenterology

## 2021-06-21 ENCOUNTER — Encounter: Payer: Self-pay | Admitting: Gastroenterology

## 2021-06-21 ENCOUNTER — Telehealth (HOSPITAL_BASED_OUTPATIENT_CLINIC_OR_DEPARTMENT_OTHER): Payer: Self-pay

## 2021-06-21 ENCOUNTER — Ambulatory Visit (AMBULATORY_SURGERY_CENTER): Payer: Commercial Managed Care - PPO | Admitting: Gastroenterology

## 2021-06-21 VITALS — BP 127/80 | HR 61 | Temp 98.0°F | Resp 14 | Ht 67.0 in | Wt 241.0 lb

## 2021-06-21 DIAGNOSIS — Z800 Family history of malignant neoplasm of digestive organs: Secondary | ICD-10-CM

## 2021-06-21 DIAGNOSIS — K635 Polyp of colon: Secondary | ICD-10-CM

## 2021-06-21 DIAGNOSIS — K5900 Constipation, unspecified: Secondary | ICD-10-CM

## 2021-06-21 DIAGNOSIS — K6389 Other specified diseases of intestine: Secondary | ICD-10-CM

## 2021-06-21 DIAGNOSIS — R1084 Generalized abdominal pain: Secondary | ICD-10-CM | POA: Diagnosis present

## 2021-06-21 DIAGNOSIS — D120 Benign neoplasm of cecum: Secondary | ICD-10-CM

## 2021-06-21 MED ORDER — SODIUM CHLORIDE 0.9 % IV SOLN: 500.0000 mL | Freq: Once | INTRAVENOUS | Status: DC

## 2021-06-21 NOTE — Op Note (Signed)
[facility] ?Patient Name: Angelica Diaz ?Procedure Date: 06/21/2021 1:43 PM ?MRN: [id number] ?Endoscopist: Viraaj Vorndran E. Candis Schatz , MD ?Age: 41 ?Referring MD:  ?Date of Birth: [birth year] ?Gender: Female ?Account #: [id number] ?Procedure:                Colonoscopy ?Indications:              Generalized abdominal pain, Family history of colon  ?                          cancer in multiple second-degree relatives ?Medicines:                Monitored Anesthesia Care ?Procedure:                Pre-Anesthesia Assessment: ?                          - Prior to the procedure, a History and Physical  ?                          was performed, and patient medications and  ?                          allergies were reviewed. The patient's tolerance of  ?                          previous anesthesia was also reviewed. The risks  ?                          and benefits of the procedure and the sedation  ?                          options and risks were discussed with the patient.  ?                          All questions were answered, and informed consent  ?                          was obtained. Prior Anticoagulants: The patient has  ?                          taken no previous anticoagulant or antiplatelet  ?                          agents. ASA Grade Assessment: II - A patient with  ?                          mild systemic disease. After reviewing the risks  ?                          and benefits, the patient was deemed in  ?                          satisfactory condition to undergo the procedure. ?  After obtaining informed consent, the colonoscope  ?                          was passed under direct vision. Throughout the  ?                          procedure, the patient's blood pressure, pulse, and  ?                          oxygen saturations were monitored continuously. The  ?                          CF [id number] #[id number] was introduced through the anus  ?                          and  advanced to the the terminal ileum, with  ?                          identification of the appendiceal orifice and IC  ?                          valve. The colonoscopy was performed without  ?                          difficulty. The patient tolerated the procedure  ?                          well. The quality of the bowel preparation was  ?                          good. The terminal ileum, ileocecal valve,  ?                          appendiceal orifice, and rectum were photographed. ?Scope In: 1:51:36 PM ?Scope Out: 2:08:39 PM ?Scope Withdrawal Time: 0 hours 13 minutes 11 seconds  ?Total Procedure Duration: 0 hours 17 minutes 3 seconds  ?Findings:                 The perianal and digital rectal examinations were  ?                          normal. Pertinent negatives include normal  ?                          sphincter tone and no palpable rectal lesions. ?                          A 2 mm polyp was found in the cecum. The polyp was  ?                          sessile. The polyp was removed with a cold biopsy  ?                          forceps. Resection  and retrieval were complete.  ?                          Estimated blood loss was minimal. ?                          The exam was otherwise normal throughout the  ?                          examined colon. ?                          A localized area of mucosa in the terminal ileum  ?                          was nodular. Biopsies were taken with a cold  ?                          forceps for histology. Estimated blood loss was  ?                          minimal. ?                          The remainder of the exam in the terminal ileum was  ?                          normal. ?                          The retroflexed view of the distal rectum and anal  ?                          verge was normal and showed no anal or rectal  ?                          abnormalities. ?Complications:            No immediate complications. ?Estimated Blood Loss:     Estimated  blood loss was minimal. ?Impression:               - One 2 mm polyp in the cecum, removed with a cold  ?                          biopsy forceps. Resected and retrieved. ?                          - Nodular ileal mucosa. Biopsied. ?                          - The distal rectum and anal verge are normal on  ?                          retroflexion view. ?Recommendation:           - Patient has a contact number available for  ?  emergencies. The signs and symptoms of potential  ?                          delayed complications were discussed with the  ?                          patient. Return to normal activities tomorrow.  ?                          Written discharge instructions were provided to the  ?                          patient. ?                          - Resume previous diet. ?                          - Continue present medications. ?                          - Await pathology results. ?                          - Repeat colonoscopy (date not yet determined) for  ?                          surveillance based on pathology results. ?Zachrey Deutscher E. Candis Schatz, MD ?06/21/2021 2:15:45 PM ?This report has been signed electronically. ?

## 2021-06-21 NOTE — Progress Notes (Signed)
History and Physical Interval Note: ? ?06/21/2021 ?1:41 PM ? ?Angelica Diaz  has presented today for endoscopic procedure(s), with the diagnosis of  ?Encounter Diagnoses  ?Name Primary?  ? Generalized abdominal pain Yes  ? Constipation, unspecified constipation type   ? Family history of colon cancer   ?Marland Kitchen  The various methods of evaluation and treatment have been discussed with the patient and/or family. After consideration of risks, benefits and other options for treatment, the patient has consented to  the endoscopic procedure(s). ? ? The patient's history has been reviewed, patient examined, no change in status, stable for endoscopic procedure(s).  I have reviewed the patient's chart and labs.  Questions were answered to the patient's satisfaction.   ? ? ?Temisha Murley E. Candis Schatz, MD ?[facility] ? ?

## 2021-06-21 NOTE — Patient Instructions (Signed)
Handout given for polyps.  YOU HAD AN ENDOSCOPIC PROCEDURE TODAY AT THE [facility]:   Refer to the procedure report that was given to you for any specific questions about what was found during the examination.  If the procedure report does not answer your questions, please call your gastroenterologist to clarify.  If you requested that your care partner not be given the details of your procedure findings, then the procedure report has been included in a sealed envelope for you to review at your convenience later.  YOU SHOULD EXPECT: Some feelings of bloating in the abdomen. Passage of more gas than usual.  Walking can help get rid of the air that was put into your GI tract during the procedure and reduce the bloating. If you had a lower endoscopy (such as a colonoscopy or flexible sigmoidoscopy) you may notice spotting of blood in your stool or on the toilet paper. If you underwent a bowel prep for your procedure, you may not have a normal bowel movement for a few days.  Please Note:  You might notice some irritation and congestion in your nose or some drainage.  This is from the oxygen used during your procedure.  There is no need for concern and it should clear up in a day or so.  SYMPTOMS TO REPORT IMMEDIATELY:   Following lower endoscopy (colonoscopy or flexible sigmoidoscopy):  Excessive amounts of blood in the stool  Significant tenderness or worsening of abdominal pains  Swelling of the abdomen that is new, acute  Fever of 100F or higher  For urgent or emergent issues, a gastroenterologist can be reached at any hour by calling [phone]. Do not use MyChart messaging for urgent concerns.    DIET:  We do recommend a small meal at first, but then you may proceed to your regular diet.  Drink plenty of fluids but you should avoid alcoholic beverages for 24 hours.  ACTIVITY:  You should plan to take it easy for the rest of today and you should NOT DRIVE or use heavy  machinery until tomorrow (because of the sedation medicines used during the test).    FOLLOW UP: Our staff will call the number listed on your records 48-72 hours following your procedure to check on you and address any questions or concerns that you may have regarding the information given to you following your procedure. If we do not reach you, we will leave a message.  We will attempt to reach you two times.  During this call, we will ask if you have developed any symptoms of COVID 19. If you develop any symptoms (ie: fever, flu-like symptoms, shortness of breath, cough etc.) before then, please call [phone].  If you test positive for Covid 19 in the 2 weeks post procedure, please call and report this information to us.    If any biopsies were taken you will be contacted by phone or by letter within the next 1-3 weeks.  Please call us at [phone] if you have not heard about the biopsies in 3 weeks.    SIGNATURES/CONFIDENTIALITY: You and/or your care partner have signed paperwork which will be entered into your electronic medical record.  These signatures attest to the fact that that the information above on your After Visit Summary has been reviewed and is understood.  Full responsibility of the confidentiality of this discharge information lies with you and/or your care-partner. 

## 2021-06-21 NOTE — Progress Notes (Signed)
Called to room to assist during endoscopic procedure.  Patient ID and intended procedure confirmed with present staff. Received instructions for my participation in the procedure from the performing physician.  

## 2021-06-21 NOTE — Progress Notes (Signed)
Pt non-responsive, VVS, Report to RN  °

## 2021-06-25 ENCOUNTER — Telehealth: Payer: Self-pay | Admitting: *Deleted

## 2021-06-25 ENCOUNTER — Telehealth: Payer: Self-pay

## 2021-06-25 NOTE — Telephone Encounter (Signed)
?  Follow up Call- ? ? ?  06/21/2021  ?  1:06 PM  ?Call back number  ?Post procedure Call Back phone  # [phone] cell  ?Permission to leave phone message Yes  ?  ? ?Patient questions: ? ?Do you have a fever, pain , or abdominal swelling? No. ?Pain Score  0 * ? ?Have you tolerated food without any problems? Yes.   ? ?Have you been able to return to your normal activities? Yes.   ? ?Do you have any questions about your discharge instructions: ?Diet   No. ?Medications  No. ?Follow up visit  No. ? ?Do you have questions or concerns about your Care? No. ? ?Actions: ?* If pain score is 4 or above: ?No action needed, pain <4. ? ? ?

## 2021-06-25 NOTE — Telephone Encounter (Signed)
Left message on f/u call 

## 2021-06-26 ENCOUNTER — Other Ambulatory Visit: Payer: Self-pay | Admitting: Gastroenterology

## 2021-07-01 NOTE — Progress Notes (Signed)
Angelica Diaz,  ?Good news: the polyp (or polyps) that I removed during your recent examination were NOT precancerous.  You should continue to follow current colorectal cancer screening guidelines with a repeat colonoscopy in 10 years.   ? ?The biopsies of your terminal ileum showed normal lymphoid tissue without any evidence of chronic inflammation or cancer. ? ?Please follow up with me as needed in the clinic for ongoing management of your chronic GI symptoms. ? ?

## 2021-07-12 ENCOUNTER — Other Ambulatory Visit: Payer: Self-pay | Admitting: Gastroenterology

## 2021-09-19 ENCOUNTER — Encounter: Payer: Self-pay | Admitting: Family Medicine

## 2021-09-19 ENCOUNTER — Ambulatory Visit: Payer: Commercial Managed Care - PPO | Admitting: Family Medicine

## 2021-09-19 VITALS — BP 124/80 | HR 95 | Temp 98.2°F | Resp 18 | Ht 67.5 in | Wt 227.8 lb

## 2021-09-19 DIAGNOSIS — M25562 Pain in left knee: Secondary | ICD-10-CM

## 2021-09-19 MED ORDER — PREDNISONE 20 MG PO TABS: 40.0000 mg | ORAL_TABLET | Freq: Every day | ORAL | 0 refills | Status: AC

## 2021-09-19 NOTE — Progress Notes (Signed)
Acute Office Visit  Subjective:     Patient ID: Angelica Diaz, female    DOB: 28-Jun-1980, 41 y.o.   MRN: [id number]  Chief Complaint  Patient presents with   Knee Pain    Worsening L Knee pain that is constant. Taking no tx.     Knee Pain  The incident occurred more than 1 week ago ((one month)). There was no injury mechanism. The pain is present in the left knee. The pain is at a severity of 5/10. The pain is moderate. The pain has been Constant since onset. Associated symptoms include muscle weakness. Pertinent negatives include no inability to bear weight, loss of motion, loss of sensation, numbness or tingling. Associated symptoms comments: Does not feel unstable with ambulation, does not feel like she is going to fall. The symptoms are aggravated by movement and weight bearing (constatnt pain at rest and with walking; worsens with stepping up (feels like it gets twisted)). She has tried ice for the symptoms. The treatment provided no relief.  She has had similar knee pain in the past, but it always went away so she tried to wait it out this time.  The past 2.5 weeks has been significantly worse so she decided to come in.     ROS: All review of systems negative except what is listed in the HPI       Objective:    BP 124/80 (BP Location: Left Arm, Patient Position: Sitting, Cuff Size: Normal)   Pulse 95   Temp 98.2 F (36.8 C) (Oral)   Resp 18   Ht 5' 7.5" (1.715 m)   Wt 227 lb 12.8 oz (103.3 kg)   LMP 05/22/2020   SpO2 99%   BMI 35.15 kg/m    Physical Exam Vitals reviewed.  Constitutional:      Appearance: Normal appearance.  Musculoskeletal:        General: Normal range of motion.     Right lower leg: No edema.     Left lower leg: No edema.     Comments: Minimal swelling over L patellar tendon and discomfort to palpation; normal ligament testing/joint stability  Skin:    General: Skin is warm and dry.  Neurological:     General: No focal deficit present.      Mental Status: She is alert and oriented to person, place, and time. Mental status is at baseline.  Psychiatric:        Mood and Affect: Mood normal.        Behavior: Behavior normal.        Thought Content: Thought content normal.        Judgment: Judgment normal.     No results found for any visits on 09/19/21.      Assessment & Plan:   Problem List Items Addressed This Visit   None Visit Diagnoses     Acute pain of left knee    -  Primary Presentation consistent with patellar tendinitis. No red flags on exam. Patient agreeably to start conservatively.  Since we are trying to avoid NSAIDs because of history of rebound headaches/migraines. We can do a short trial of prednisone to get the inflammation down. You can try Voltaren Gel over the counter as well. Continue with rest, ice, compression, elevation. I will refer to physical therapy for you to try. If not making any progress, we can xray and refer to sports medicine.   Relevant Medications   predniSONE (DELTASONE) 20 MG tablet  Other Relevant Orders   Ambulatory referral to Physical Therapy       Meds ordered this encounter  Medications   predniSONE (DELTASONE) 20 MG tablet    Sig: Take 2 tablets (40 mg total) by mouth daily with breakfast for 5 days.    Dispense:  10 tablet    Refill:  0    Order Specific Question:   Supervising Provider    Answer:   Penni Homans A [[id number]]    Return in about 4 weeks (around 10/17/2021), or if symptoms worsen or fail to improve.  Terrilyn Saver, NP

## 2021-09-19 NOTE — Patient Instructions (Addendum)
Since we are trying to avoid NSAIDs because of history of rebound headaches/migraines. We can do a short trial of prednisone to get the inflammation down. You can try Voltaren Gel over the counter as well. Continue with rest, ice, compression, elevation. I will refer to physical therapy for you to try. If not making any progress, we can xray and refer to sports medicine.

## 2021-10-06 ENCOUNTER — Encounter: Payer: Self-pay | Admitting: Family Medicine

## 2021-10-06 DIAGNOSIS — M25562 Pain in left knee: Secondary | ICD-10-CM

## 2021-10-07 NOTE — Telephone Encounter (Signed)
Pt is still inquiring about ortho referral. Please advise.

## 2021-10-10 ENCOUNTER — Ambulatory Visit: Payer: Commercial Managed Care - PPO | Admitting: Orthopaedic Surgery

## 2021-10-10 ENCOUNTER — Encounter: Payer: Self-pay | Admitting: Orthopaedic Surgery

## 2021-10-10 ENCOUNTER — Ambulatory Visit (INDEPENDENT_AMBULATORY_CARE_PROVIDER_SITE_OTHER): Payer: Commercial Managed Care - PPO

## 2021-10-10 DIAGNOSIS — G8929 Other chronic pain: Secondary | ICD-10-CM | POA: Diagnosis not present

## 2021-10-10 DIAGNOSIS — M25562 Pain in left knee: Secondary | ICD-10-CM

## 2021-10-10 MED ORDER — LIDOCAINE HCL 1 % IJ SOLN
2.0000 mL | INTRAMUSCULAR | Status: AC | PRN
  Administered 2021-10-10: 2 mL

## 2021-10-10 MED ORDER — BUPIVACAINE HCL 0.25 % IJ SOLN
2.0000 mL | INTRAMUSCULAR | Status: AC | PRN
  Administered 2021-10-10: 2 mL via INTRA_ARTICULAR

## 2021-10-10 MED ORDER — METHYLPREDNISOLONE ACETATE 40 MG/ML IJ SUSP
80.0000 mg | INTRAMUSCULAR | Status: AC | PRN
  Administered 2021-10-10: 80 mg via INTRA_ARTICULAR

## 2021-10-10 NOTE — Progress Notes (Signed)
Office Visit Note   Patient: Angelica Diaz           Date of Birth: [birth year]           MRN: [id number] Visit Date: 10/10/2021              Requested by: Terrilyn Saver, NP [address] [place],  [place] [address] PCP: Mackie Pai, PA-C   Assessment & Plan: Visit Diagnoses:  1. Chronic pain of left knee     Plan: Patient is a pleasant 41 year old woman with a 2-1/30-monthhistory of left knee pain.  She denies any injury or any change in activity.  She points that it hurts just below the patella and radiates medially.  About 2-1/2 weeks ago she did visit with her primary care provider who placed her on steroid Dosepak she said this did not help at all.  Patient was also given exercises but she found this did not help either.  She noticed over the last couple days that she had swelling that actually went down to her foot.  She denies any calf pain.  She was ill a few months ago and had a positive Homans' sign and had ultrasound of her lower extremities which was negative for DVT.  On exam most of her pain that is acute is in the distal end of the patella tendon.  She also has some pain medially but this may be secondary to the arthritis as seen on x-ray.  No abnormalities other than some lateral tilt were seen in the patella.  She also has significant pain extending her leg but is able to sustain a straight leg.  She may benefit from cortisone injection beneath the patella.  She would like to go forward with this today.  If in 2 weeks she has no improvement she may call here and we will order an MRI of her left knee  Follow-Up Instructions: If fails to improve  Orders:  Orders Placed This Encounter  Procedures   Large Joint Inj   XR KNEE 3 VIEW LEFT   No orders of the defined types were placed in this encounter.     Procedures: Large Joint Inj on 10/10/2021 4:02 PM Indications: pain and diagnostic evaluation Details: 25 G 1.5 in needle, superior  approach  Arthrogram: No  Medications: 80 mg methylPREDNISolone acetate 40 MG/ML; 2 mL lidocaine 1 %; 2 mL bupivacaine 0.25 % Outcome: tolerated well, no immediate complications Procedure, treatment alternatives, risks and benefits explained, specific risks discussed. Consent was given by the patient.      Clinical Data: No additional findings.   Subjective: Chief Complaint  Patient presents with   Left Knee - Pain  Patient presents today for left knee pain. She said that the pain started two months ago. She then developed swelling two days ago. She said that her pain is below her patella. She saw her PCP and tried oral prednisone, but it didn't help.     Review of Systems  All other systems reviewed and are negative.    Objective: Vital Signs: LMP 05/22/2020   Physical Exam Constitutional:      Appearance: Normal appearance.  Pulmonary:     Effort: Pulmonary effort is normal.  Musculoskeletal:     Cervical back: Normal range of motion.  Skin:    General: Skin is warm and dry.  Neurological:     Mental Status: She is alert.     Ortho Exam Examination  of her left knee she has no warmth no redness she does have some mild soft tissue swelling and compared to the right knee.  She has good varus valgus stability.  She has difficulty extending her leg secondary to pain but once extended can maintain this.  She has most of her tenderness over the distal patellar tendon insertion.  She also has some tenderness over the medial joint line.  No swelling in her foot today.  Compartments of the lower leg are soft and compressible she has no tenderness.  Negative Homans' sign Specialty Comments:  No specialty comments available.  Imaging: XR KNEE 3 VIEW LEFT  Result Date: 10/10/2021 Three-view radiographs of her left knee were obtained today.  She does have some medial joint space narrowing with subchondral sclerosis.  Lateral joint is fairly well-preserved.  Patellofemoral  joint she does have some early arthritic changes with lateral tilt.  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   S/P laparoscopic hysterectomy 06/13/2020   MDD (major depressive disorder), recurrent severe, without psychosis ([place]) 11/02/2019   Obesity 05/18/2015   Bipolar I disorder ([place]) 05/18/2015   RUQ abdominal pain 08/15/2013   Biliary dyskinesia s/p lap chole 05/27/2013 05/25/2013   Meningioma ([place]) 01/06/2013   Type 2 HSV infection of vulvovaginal region 09/16/2012   Extrinsic asthma 02/11/2008   Disturbance in sleep behavior 12/20/2007   Headache 06/25/2007   PAP SMEAR, ABNORMAL 01/07/2007   Past Medical History:  Diagnosis Date   Allergy    Anxiety    Arthritis    Asthma    Chronic headaches    Depression    History of 2019 novel coronavirus disease (COVID-19) 03/10/2019   per pt asymptomatic was exposed ,  results in care everywhere   History of asthma    as teen   Insomnia    Meningioma, cerebral ([place])    left frontal , asymptomatic; neurologist--- dr c. patel  (MRI 11-06-2018 in epic)   Menorrhagia    Migraine    Pneumonia    Wears contact lenses     Family History  Problem Relation Age of Onset   Migraines Mother    Colon cancer Maternal Uncle        90s   Colon cancer Maternal Grandmother        age unknown   Diabetes Maternal Grandfather    Colon cancer Maternal Grandfather        age unknown   Migraines Son    Throat cancer Neg Hx    Pancreatic cancer Neg Hx    Stomach cancer Neg Hx    Heart disease Neg Hx    Kidney disease Neg Hx    Liver disease Neg Hx    Rectal cancer Neg Hx    Esophageal cancer Neg Hx     Past Surgical History:  Procedure Laterality Date   [place] N/A 05/27/2013   Procedure: LAPAROSCOPIC CHOLECYSTECTOMY WITH INTRAOPERATIVE  CHOLANGIOGRAM;  Surgeon: Earnstine Regal, MD;  Location: [facility];  Service: General;  Laterality: N/A;   dental implants  2018   upper   DILATION AND CURETTAGE OF UTERUS  yrs ago   [place] N/A 03/20/2020   Procedure: DILATATION & CURETTAGE/HYSTEROSCOPY WITH NOVASURE ABLATION;  Surgeon: Azucena Fallen, MD;  Location: [facility];  Service: Gynecology;  Laterality: N/A;   fallopian tubes removed  2018   ROBOTIC ASSISTED TOTAL HYSTERECTOMY N/A 06/13/2020   Procedure: XI ROBOTIC ASSISTED TOTAL HYSTERECTOMY;  Surgeon: Azucena Fallen, MD;  Location: [facility];  Service: Gynecology;  Laterality: N/A;  Requests 3hrs.   WISDOM TOOTH EXTRACTION  yrs ago   Social History   Occupational History   Occupation: Animator: [employer]   Tobacco Use   Smoking status: Every Day    Years: 8.00    Types: Cigarettes   Smokeless tobacco: Never   Tobacco comments:    06-21-2021 per pt 7-8 cig per day  Vaping Use   Vaping Use: Never used  Substance and Sexual Activity   Alcohol use: Yes    Comment: socially, rarely   Drug use: Never   Sexual activity: Not on file

## 2021-10-14 ENCOUNTER — Ambulatory Visit (INDEPENDENT_AMBULATORY_CARE_PROVIDER_SITE_OTHER): Payer: Commercial Managed Care - PPO | Admitting: Medical

## 2021-10-14 ENCOUNTER — Encounter: Payer: Self-pay | Admitting: Medical

## 2021-10-14 VITALS — BP 133/86 | HR 98 | Temp 98.5°F | Resp 18 | Wt 224.0 lb

## 2021-10-14 DIAGNOSIS — L089 Local infection of the skin and subcutaneous tissue, unspecified: Secondary | ICD-10-CM

## 2021-10-14 DIAGNOSIS — B028 Zoster with other complications: Secondary | ICD-10-CM | POA: Diagnosis not present

## 2021-10-14 MED ORDER — AMOXICILLIN-POT CLAVULANATE 875-125 MG PO TABS: 1.0000 | ORAL_TABLET | Freq: Two times a day (BID) | ORAL | 0 refills | Status: DC

## 2021-10-14 MED ORDER — HYDROXYZINE HCL 10 MG PO TABS: ORAL_TABLET | ORAL | 0 refills | Status: DC

## 2021-10-14 MED ORDER — FAMCICLOVIR 500 MG PO TABS: 500.0000 mg | ORAL_TABLET | Freq: Three times a day (TID) | ORAL | 0 refills | Status: DC

## 2021-10-14 NOTE — Addendum Note (Signed)
Addended by: Anabel Halon on: 10/14/2021 04:43 PM   Modules accepted: Orders

## 2021-10-14 NOTE — Progress Notes (Signed)
Subjective:    Patient ID: Angelica Diaz, female    DOB: 22-Jul-1980, 41 y.o.   MRN: [id number]  HPI Pt got breakout of grouped small appearing vesicles on Saturday. Pt had had recurrent outbreaks in this are for 3-4 times since her 20's. 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   I had sent in famvir.   Some sharp tingling pain with itching.  Review of Systems  Constitutional:  Negative for chills, fatigue and fever.  Respiratory:  Negative for chest tightness and wheezing.   Cardiovascular:  Negative for chest pain and palpitations.  Musculoskeletal:  Negative for back pain.  Skin:        See hpi.   Hematological:  Negative for adenopathy. Does not bruise/bleed easily.  Psychiatric/Behavioral:  Negative for behavioral problems and confusion.     Past Medical History:  Diagnosis Date   Allergy    Anxiety    Arthritis    Asthma    Chronic headaches    Depression    History of 2019 novel coronavirus disease (COVID-19) 03/10/2019   per pt asymptomatic was exposed ,  results in care everywhere   History of asthma    as teen   Insomnia    Meningioma, cerebral ([place])    left frontal , asymptomatic; neurologist--- dr c. patel  (MRI 11-06-2018 in epic)   Menorrhagia    Migraine    Pneumonia    Wears contact lenses      Social History   Socioeconomic History   Marital status: Divorced    Spouse name: Not on file   Number of children: 2   Years of education: Not on file   Highest education level: Not on file  Occupational History   Occupation: Medical illustrator    Employer: [employer]   Tobacco Use   Smoking status: Every Day    Years: 8.00    Types: Cigarettes   Smokeless tobacco: Never   Tobacco comments:    06-21-2021 per pt 7-8 cig per day  Vaping Use   Vaping Use: Never used  Substance and Sexual Activity   Alcohol use: Yes    Comment: socially, rarely   Drug use: Never   Sexual activity: Not on file  Other Topics Concern   Not on file  Social History  Narrative   Unemployed.      Applying for disability.      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  She has two healthy children.      Lives in single story home.      Right handed.      Highest level of edu- highschool      Social Determinants of Health   Financial Resource Strain: Not on file  Food Insecurity: Not on file  Transportation Needs: Not on file  Physical Activity: Not on file  Stress: Not on file  Social Connections: Not on file  Intimate Partner Violence: Not on file    Past Surgical History:  Procedure Laterality Date   [place] N/A 05/27/2013   Procedure: LAPAROSCOPIC CHOLECYSTECTOMY WITH INTRAOPERATIVE CHOLANGIOGRAM;  Surgeon: Earnstine Regal, MD;  Location: [facility];  Service: General;  Laterality: N/A;   dental implants  2018   upper   DILATION AND CURETTAGE OF UTERUS  yrs ago   [place] N/A 03/20/2020   Procedure: DILATATION & CURETTAGE/HYSTEROSCOPY WITH NOVASURE ABLATION;  Surgeon: Azucena Fallen, MD;  Location[facility];  Service: Gynecology;  Laterality: N/A;   fallopian tubes removed  2018   ROBOTIC ASSISTED TOTAL HYSTERECTOMY N/A 06/13/2020   Procedure: XI ROBOTIC ASSISTED TOTAL HYSTERECTOMY;  Surgeon: Azucena Fallen, MD;  Location: [facility];  Service: Gynecology;  Laterality: N/A;  Requests 3hrs.   WISDOM TOOTH EXTRACTION  yrs ago    Family History  Problem Relation Age of Onset   Migraines Mother    Colon cancer Maternal Uncle        36s   Colon cancer Maternal Grandmother        age unknown   Diabetes Maternal Grandfather    Colon cancer Maternal Grandfather        age unknown   Migraines Son    Throat cancer Neg Hx    Pancreatic cancer Neg Hx    Stomach cancer Neg Hx    Heart disease Neg Hx    Kidney disease Neg Hx    Liver disease Neg Hx    Rectal cancer Neg Hx    Esophageal cancer Neg Hx     No Known Allergies  Current  Outpatient Medications on File Prior to Visit  Medication Sig Dispense Refill   cloNIDine (CATAPRES) 0.2 MG tablet Take 0.6 mg by mouth at bedtime.     dicyclomine (BENTYL) 20 MG tablet TAKE 1 TABLET BY MOUTH EVERY 6 HOURS 360 tablet 1   doxepin (SINEQUAN) 75 MG capsule Take 75 mg by mouth at bedtime.     eszopiclone (LUNESTA) 1 MG TABS tablet Take 1 mg by mouth at bedtime.     famciclovir (FAMVIR) 500 MG tablet Take 1 tablet (500 mg total) by mouth 3 (three) times daily. 30 tablet 0   FLOWFLEX COVID-19 AG HOME TEST KIT      FLUoxetine (PROZAC) 10 MG tablet Take 10 mg by mouth at bedtime.     QUEtiapine (SEROQUEL) 50 MG tablet Take 50 mg by mouth at bedtime.     No current facility-administered medications on file prior to visit.    BP 133/86   Pulse 98   Temp 98.5 F (36.9 C)   Resp 18   Wt 224 lb (101.6 kg)   LMP 05/22/2020   SpO2 100%   BMI 34.57 kg/m        Objective:   Physical Exam  General- No acute distress. Pleasant patient. Neck- Full range of motion, no jvd Lungs- Clear, even and unlabored. Heart- regular rate and rhythm. Neurologic- CNII- XII grossly intact.   Rt elbow- lateral area 2 cm red area with appearance of tiny grouped vesicles. Mild warm to touch and feels indurated.  Good rom elbow.      Assessment & Plan:   Patient Instructions  Probable shingles with secondary skin infection. Start famvir and start augmentin.  For itching can rx hydroxyzine 10 low dose 1-2 tab po q 8 hours prn itching.  If sharp pain gets worse then can rx tramadol for nerve pain.  Follow up in 10 days or sooner if needed.

## 2021-10-14 NOTE — Addendum Note (Signed)
Addended by: Anabel Halon on: 10/14/2021 09:15 AM   Modules accepted: Orders

## 2021-10-14 NOTE — Patient Instructions (Addendum)
Probable shingles with secondary skin infection. Start famvir and start augmentin.  For itching can rx hydroxyzine 10 low dose 1-2 tab po q 8 hours prn itching.  If sharp pain gets worse then can rx tramadol for nerve pain.  Follow up in 10 days or sooner if needed.  Counseled on asking your medical insurance if they would be ok/cover shingrix vaccine about one year since you have hx of recurrent shingles eruptions

## 2022-01-23 ENCOUNTER — Ambulatory Visit: Payer: Commercial Managed Care - PPO | Admitting: Orthopaedic Surgery

## 2022-01-23 ENCOUNTER — Encounter: Payer: Self-pay | Admitting: Orthopaedic Surgery

## 2022-01-23 DIAGNOSIS — M25562 Pain in left knee: Secondary | ICD-10-CM

## 2022-01-23 DIAGNOSIS — G8929 Other chronic pain: Secondary | ICD-10-CM | POA: Diagnosis not present

## 2022-01-23 MED ORDER — BUPIVACAINE HCL 0.25 % IJ SOLN
2.0000 mL | INTRAMUSCULAR | Status: AC | PRN
  Administered 2022-01-23: 2 mL via INTRA_ARTICULAR

## 2022-01-23 MED ORDER — METHYLPREDNISOLONE ACETATE 40 MG/ML IJ SUSP
80.0000 mg | INTRAMUSCULAR | Status: AC | PRN
  Administered 2022-01-23: 80 mg via INTRA_ARTICULAR

## 2022-01-23 MED ORDER — LIDOCAINE HCL 1 % IJ SOLN
2.0000 mL | INTRAMUSCULAR | Status: AC | PRN
  Administered 2022-01-23: 2 mL

## 2022-01-23 NOTE — Progress Notes (Signed)
Office Visit Note   Patient: Angelica Diaz           Date of Birth: 1980-10-26           MRN: [id number] Visit Date: 01/23/2022              Requested by: Mackie Pai, PA-C [address],  [place] [address] PCP: Mackie Pai, PA-C   Assessment & Plan: Visit Diagnoses:  1. Chronic pain of left knee     Plan: Ms. Orchard is a pleasant 41 year old woman who follows up today for her anterior left knee pain.  She has had some problems with her left knee since she was 41 years old but has now had more increased pain over the last year.  She was seen in July and did have an injection which seemed to help her but she now notices the pain is returning.  Over the course of this year she has tried a home directed physical therapy program as well as previous injection.  She denies any real medial lateral joint line but is very tender over the distal patella tendon insertion.  Does not really have any patellofemoral findings which was more the case after her last visit.  Given the length of time and feel of conservative treatment we will order an MRI and she can follow-up afterwards.  She would also like a repeat injection today.  She understands that we cannot inject the patella tendon which is her maximum area of pain but can inject the joint as this seemed to help her in the past  Follow-Up Instructions: Return After MRI left knee.   Orders:  Orders Placed This Encounter  Procedures   Large Joint Inj: L knee   MR Knee Left w/o contrast   No orders of the defined types were placed in this encounter.     Procedures: Large Joint Inj: L knee on 01/23/2022 4:41 PM Indications: pain and diagnostic evaluation Details: 25 G 1.5 in needle, anteromedial approach  Arthrogram: No  Medications: 80 mg methylPREDNISolone acetate 40 MG/ML; 2 mL lidocaine 1 %; 2 mL bupivacaine 0.25 % Outcome: tolerated well, no immediate complications Procedure, treatment alternatives,  risks and benefits explained, specific risks discussed. Consent was given by the patient.       Clinical Data: No additional findings.   Subjective: Chief Complaint  Patient presents with   Left Knee - Pain  Patient presents today for left knee pain. She said that the last cortisone injection did help. She wants to talk about her options today.     Review of Systems  All other systems reviewed and are negative.    Objective: Vital Signs: LMP 05/22/2020   Physical Exam Constitutional:      Appearance: Normal appearance.  Pulmonary:     Effort: Pulmonary effort is normal.  Skin:    General: Skin is warm and dry.  Neurological:     General: No focal deficit present.     Mental Status: She is alert.     Ortho Exam Examination of her left knee she has no effusion she has no erythema or warmth.  No real tenderness over the medial or lateral joint line.  No tenderness with compression of the patella.  She has good varus valgus stability.  She does have focal tenderness over the patellar tendon especially in its distal insertion.  She is able to sustain a straight leg raise.  She is neurovascularly intact.  Compartments of the lower leg are soft and nontender Specialty Comments:  No specialty comments available.  Imaging: No results found.   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   S/P laparoscopic hysterectomy 06/13/2020   MDD (major depressive disorder), recurrent severe, without psychosis ([place]) 11/02/2019   Obesity 05/18/2015   Bipolar I disorder ([place]) 05/18/2015   RUQ abdominal pain 08/15/2013   Biliary dyskinesia s/p lap chole 05/27/2013 05/25/2013   Meningioma ([place]) 01/06/2013   Type 2 HSV infection of vulvovaginal region 09/16/2012   Extrinsic asthma 02/11/2008   Disturbance in sleep behavior 12/20/2007   Headache 06/25/2007   PAP SMEAR, ABNORMAL  01/07/2007   Past Medical History:  Diagnosis Date   Allergy    Anxiety    Arthritis    Asthma    Chronic headaches    Depression    History of 2019 novel coronavirus disease (COVID-19) 03/10/2019   per pt asymptomatic was exposed ,  results in care everywhere   History of asthma    as teen   Insomnia    Meningioma, cerebral ([place])    left frontal , asymptomatic; neurologist--- dr c. patel  (MRI 11-06-2018 in epic)   Menorrhagia    Migraine    Pneumonia    Wears contact lenses     Family History  Problem Relation Age of Onset   Migraines Mother    Colon cancer Maternal Uncle        26s   Colon cancer Maternal Grandmother        age unknown   Diabetes Maternal Grandfather    Colon cancer Maternal Grandfather        age unknown   Migraines Son    Throat cancer Neg Hx    Pancreatic cancer Neg Hx    Stomach cancer Neg Hx    Heart disease Neg Hx    Kidney disease Neg Hx    Liver disease Neg Hx    Rectal cancer Neg Hx    Esophageal cancer Neg Hx     Past Surgical History:  Procedure Laterality Date   [place] N/A 05/27/2013   Procedure: LAPAROSCOPIC CHOLECYSTECTOMY WITH INTRAOPERATIVE CHOLANGIOGRAM;  Surgeon: Earnstine Regal, MD;  Location: [facility];  Service: General;  Laterality: N/A;   dental implants  2018   upper   DILATION AND CURETTAGE OF UTERUS  yrs ago   [place] N/A 03/20/2020   Procedure: DILATATION & CURETTAGE/HYSTEROSCOPY WITH NOVASURE ABLATION;  Surgeon: Azucena Fallen, MD;  Location: [facility];  Service: Gynecology;  Laterality: N/A;   fallopian tubes removed  2018   ROBOTIC ASSISTED TOTAL HYSTERECTOMY N/A 06/13/2020   Procedure: XI ROBOTIC ASSISTED TOTAL HYSTERECTOMY;  Surgeon: Azucena Fallen, MD;  Location: [facility];  Service: Gynecology;  Laterality: N/A;  Requests 3hrs.   WISDOM TOOTH EXTRACTION  yrs ago   Social History   Occupational  History   Occupation: Animator: [employer]   Tobacco Use   Smoking status: Every Day    Years: 8.00    Types: Cigarettes   Smokeless tobacco: Never   Tobacco comments:    06-21-2021 per pt 7-8 cig per day  Vaping Use   Vaping Use: Never used  Substance and Sexual Activity   Alcohol use: Yes    Comment: socially,  rarely   Drug use: Never   Sexual activity: Not on file

## 2022-02-09 ENCOUNTER — Ambulatory Visit
Admission: RE | Admit: 2022-02-09 | Discharge: 2022-02-09 | Disposition: A | Discharge status: Home / Self Care | Payer: Commercial Managed Care - PPO | Source: Ambulatory Visit | Attending: Physician Assistant | Admitting: Physician Assistant

## 2022-02-09 DIAGNOSIS — G8929 Other chronic pain: Secondary | ICD-10-CM

## 2022-02-12 ENCOUNTER — Ambulatory Visit: Payer: Commercial Managed Care - PPO | Admitting: Orthopaedic Surgery

## 2022-02-12 ENCOUNTER — Encounter: Payer: Self-pay | Admitting: Orthopaedic Surgery

## 2022-02-12 DIAGNOSIS — G8929 Other chronic pain: Secondary | ICD-10-CM | POA: Diagnosis not present

## 2022-02-12 DIAGNOSIS — M25562 Pain in left knee: Secondary | ICD-10-CM | POA: Diagnosis not present

## 2022-02-12 NOTE — Progress Notes (Signed)
Office Visit Note   Patient: Angelica Diaz           Date of Birth: 07-Jun-1980           MRN: [id number] Visit Date: 02/12/2022              Requested by: Mackie Pai, PA-C [place] [place],  [place] [id number] PCP: Mackie Pai, PA-C   Assessment & Plan: Visit Diagnoses:  1. Chronic pain of left knee     Plan: Ms. Zeis had an MRI scan of her left knee without contrast.  The medial lateral menisci were intact.  Collateral ligaments were intact as were the ACL and PCL.  There was no chondral defect of the patellofemoral joint or lateral compartment but there was some cartilage thinning over the central weightbearing area of the medial femoral condyle and medial tibial plateau.  No popliteal cyst.  Extensor mechanism was intact including the quadriceps tendon and the patella tendon where she has had some discomfort.  There is no bone marrow abnormality.  Discussion regarding all the above.  I do not think this is referred pain.  I think the best approach would be a course of physical therapy and then some strengthening exercises for both of the lower extremities.  She might do better with nonweightbearing exercises like swimming or riding a bike and even with some of the weights.  All questions were answered we will set up therapy here as it is convenient.  Like to see her back over the next 6 weeks or so if she has not improved.  Again, do not think this is referred from her hip or her back  Follow-Up Instructions: Return if symptoms worsen or fail to improve.   Orders:  Orders Placed This Encounter  Procedures   Ambulatory referral to Physical Therapy   No orders of the defined types were placed in this encounter.     Procedures: No procedures performed   Clinical Data: No additional findings.   Subjective: Chief Complaint  Patient presents with   Left Knee - Follow-up    MRI review  Patient presents today for follow up on left knee. She had  an MRI and is here today to discuss those results.  HPI  Review of Systems   Objective: Vital Signs: LMP 05/22/2020   Physical Exam Constitutional:      Appearance: She is well-developed.  Eyes:     Pupils: Pupils are equal, round, and reactive to light.  Pulmonary:     Effort: Pulmonary effort is normal.  Skin:    General: Skin is warm and dry.  Neurological:     Mental Status: She is alert and oriented to person, place, and time.  Psychiatric:        Behavior: Behavior normal.     Ortho Exam awake alert and oriented x3.  Comfortable sitting.  Examination of the left knee there was no effusion.  There is some mild medial joint tenderness knee but some pain over the patella tendon where there was no issue on MRI scan.  Full quick extension.  No instability.  No popliteal pain or mass.  No calf pain.  No pain along the proximal tibia either medially or laterally.  Straight leg raise negative.  Painless range of motion right hip.  No use of ambulatory aid.  Neurologically intact  Specialty Comments:  No specialty comments available.  Imaging: No results found.   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   S/P laparoscopic hysterectomy 06/13/2020   MDD (major depressive disorder), recurrent severe, without psychosis ([place]) 11/02/2019   Obesity 05/18/2015   Bipolar I disorder ([place]) 05/18/2015   RUQ abdominal pain 08/15/2013   Biliary dyskinesia s/p lap chole 05/27/2013 05/25/2013   Meningioma ([place]) 01/06/2013   Type 2 HSV infection of vulvovaginal region 09/16/2012   Extrinsic asthma 02/11/2008   Disturbance in sleep behavior 12/20/2007   Headache 06/25/2007   PAP SMEAR, ABNORMAL 01/07/2007   Past Medical History:  Diagnosis Date   Allergy    Anxiety    Arthritis    Asthma    Chronic headaches    Depression    History of 2019 novel coronavirus disease (COVID-19)  03/10/2019   per pt asymptomatic was exposed ,  results in care everywhere   History of asthma    as teen   Insomnia    Meningioma, cerebral ([place])    left frontal , asymptomatic; neurologist--- dr c. patel  (MRI 11-06-2018 in epic)   Menorrhagia    Migraine    Pneumonia    Wears contact lenses     Family History  Problem Relation Age of Onset   Migraines Mother    Colon cancer Maternal Uncle        59s   Colon cancer Maternal Grandmother        age unknown   Diabetes Maternal Grandfather    Colon cancer Maternal Grandfather        age unknown   Migraines Son    Throat cancer Neg Hx    Pancreatic cancer Neg Hx    Stomach cancer Neg Hx    Heart disease Neg Hx    Kidney disease Neg Hx    Liver disease Neg Hx    Rectal cancer Neg Hx    Esophageal cancer Neg Hx     Past Surgical History:  Procedure Laterality Date   [place] N/A 05/27/2013   Procedure: LAPAROSCOPIC CHOLECYSTECTOMY WITH INTRAOPERATIVE CHOLANGIOGRAM;  Surgeon: Earnstine Regal, MD;  Location: [facility];  Service: General;  Laterality: N/A;   dental implants  2018   upper   DILATION AND CURETTAGE OF UTERUS  yrs ago   [place] N/A 03/20/2020   Procedure: DILATATION & CURETTAGE/HYSTEROSCOPY WITH NOVASURE ABLATION;  Surgeon: Azucena Fallen, MD;  Location[facility];  Service: Gynecology;  Laterality: N/A;   fallopian tubes removed  2018   ROBOTIC ASSISTED TOTAL HYSTERECTOMY N/A 06/13/2020   Procedure: XI ROBOTIC ASSISTED TOTAL HYSTERECTOMY;  Surgeon: Azucena Fallen, MD;  Location: [facility];  Service: Gynecology;  Laterality: N/A;  Requests 3hrs.   WISDOM TOOTH EXTRACTION  yrs ago   Social History   Occupational History   Occupation: Animator: [employer]   Tobacco Use   Smoking status: Every Day    Years: 8.00    Types: Cigarettes   Smokeless tobacco: Never   Tobacco  comments:    06-21-2021 per pt 7-8 cig per day  Vaping Use   Vaping Use: Never used  Substance and Sexual Activity   Alcohol use: Yes    Comment: socially, rarely   Drug use: Never   Sexual activity: Not on file

## 2022-02-14 ENCOUNTER — Encounter: Payer: Self-pay | Admitting: Medical

## 2022-02-14 NOTE — Telephone Encounter (Signed)
Discussed with pt that it appears that dx was first placed in 2017 by other provider. My dx in notes was depression but bipolar did show on my dx. I resolved and changed to depression.    I explained can't remove from other provider note. I am not sure if can remove from prior problem list.  Pt states new ADHD and all her symptoms resolved. All depression and anxiety resolved. Now on adderall. New psychiatrist states former diagnosis were incorrect.  Mackie Pai, PA-C

## 2022-03-03 ENCOUNTER — Ambulatory Visit (INDEPENDENT_AMBULATORY_CARE_PROVIDER_SITE_OTHER): Payer: Commercial Managed Care - PPO | Admitting: Medical

## 2022-03-03 ENCOUNTER — Encounter: Payer: Self-pay | Admitting: Medical

## 2022-03-03 VITALS — BP 138/84 | HR 90 | Temp 98.2°F | Ht 67.5 in | Wt 213.0 lb

## 2022-03-03 DIAGNOSIS — Z0000 Encounter for general adult medical examination without abnormal findings: Secondary | ICD-10-CM

## 2022-03-03 DIAGNOSIS — Z1231 Encounter for screening mammogram for malignant neoplasm of breast: Secondary | ICD-10-CM | POA: Diagnosis not present

## 2022-03-03 NOTE — Progress Notes (Signed)
Subjective:    Patient ID: Angelica Diaz, female    DOB: 08/07/1980, 41 y.o.   MRN: [id number]  HPI  Pt in for follow up.  She is her for wellness exam.  Pt not exercising. She is eating healthier. No soda. Smoking pack every 3 days. Alcohol 4-5 times a year.   Declined flu vaccine. Due for mammogram. Hysterectomy.    Review of Systems  Constitutional:  Negative for chills, fatigue and fever.  HENT:  Negative for dental problem and ear discharge.   Respiratory:  Negative for cough, chest tightness, shortness of breath and wheezing.   Cardiovascular:  Negative for chest pain and palpitations.  Gastrointestinal:  Negative for abdominal pain, blood in stool, constipation and nausea.  Genitourinary:  Negative for dyspareunia, dysuria, flank pain and frequency.  Musculoskeletal:  Negative for back pain.  Neurological:  Negative for dizziness, speech difficulty, weakness, numbness and headaches.  Hematological:  Does not bruise/bleed easily.  Psychiatric/Behavioral:  Negative for behavioral problems, dysphoric mood, sleep disturbance and suicidal ideas. The patient is not nervous/anxious.        ADD. Treated by psychiatrist.    Past Medical History:  Diagnosis Date   Allergy    Anxiety    Arthritis    Asthma    Chronic headaches    Depression    History of 2019 novel coronavirus disease (COVID-19) 03/10/2019   per pt asymptomatic was exposed ,  results in care everywhere   History of asthma    as teen   Insomnia    Meningioma, cerebral ([place])    left frontal , asymptomatic; neurologist--- dr c. patel  (MRI 11-06-2018 in epic)   Menorrhagia    Migraine    Pneumonia    Wears contact lenses      Social History   Socioeconomic History   Marital status: Divorced    Spouse name: Not on file   Number of children: 2   Years of education: Not on file   Highest education level: Not on file  Occupational History   Occupation: Medical illustrator    Employer: [employer]   Tobacco Use   Smoking status: Every Day    Years: 8.00    Types: Cigarettes   Smokeless tobacco: Never   Tobacco comments:    06-21-2021 per pt 7-8 cig per day  Vaping Use   Vaping Use: Never used  Substance and Sexual Activity   Alcohol use: Yes    Comment: socially, rarely   Drug use: Never   Sexual activity: Not on file  Other Topics Concern   Not on file  Social History Narrative   Unemployed.      Applying for disability.      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  She has two healthy children.      Lives in single story home.      Right handed.      Highest level of edu- highschool      Social Determinants of Health   Financial Resource Strain: Not on file  Food Insecurity: Not on file  Transportation Needs: Not on file  Physical Activity: Not on file  Stress: Not on file  Social Connections: Not on file  Intimate Partner Violence: Not on file    Past Surgical History:  Procedure Laterality Date   [place] N/A 05/27/2013   Procedure: LAPAROSCOPIC CHOLECYSTECTOMY WITH INTRAOPERATIVE CHOLANGIOGRAM;  Surgeon: Earnstine Regal, MD;  Location: [facility];  Service:  General;  Laterality: N/A;   dental implants  2018   upper   DILATION AND CURETTAGE OF UTERUS  yrs ago   [place] N/A 03/20/2020   Procedure: DILATATION & CURETTAGE/HYSTEROSCOPY WITH NOVASURE ABLATION;  Surgeon: Azucena Fallen, MD;  Location: [facility];  Service: Gynecology;  Laterality: N/A;   fallopian tubes removed  2018   ROBOTIC ASSISTED TOTAL HYSTERECTOMY N/A 06/13/2020   Procedure: XI ROBOTIC ASSISTED TOTAL HYSTERECTOMY;  Surgeon: Azucena Fallen, MD;  Location: [facility];  Service: Gynecology;  Laterality: N/A;  Requests 3hrs.   WISDOM TOOTH EXTRACTION  yrs ago    Family History  Problem Relation Age of Onset   Migraines Mother    Colon cancer Maternal Uncle        80s   Colon  cancer Maternal Grandmother        age unknown   Diabetes Maternal Grandfather    Colon cancer Maternal Grandfather        age unknown   Migraines Son    Throat cancer Neg Hx    Pancreatic cancer Neg Hx    Stomach cancer Neg Hx    Heart disease Neg Hx    Kidney disease Neg Hx    Liver disease Neg Hx    Rectal cancer Neg Hx    Esophageal cancer Neg Hx     No Known Allergies  Current Outpatient Medications on File Prior to Visit  Medication Sig Dispense Refill   amphetamine-dextroamphetamine (ADDERALL) 20 MG tablet Take 20 mg by mouth every morning.     cloNIDine (CATAPRES) 0.2 MG tablet Take 0.6 mg by mouth at bedtime.     dicyclomine (BENTYL) 20 MG tablet TAKE 1 TABLET BY MOUTH EVERY 6 HOURS 360 tablet 1   eszopiclone (LUNESTA) 1 MG TABS tablet Take 1 mg by mouth at bedtime.     QUEtiapine (SEROQUEL) 50 MG tablet Take 50 mg by mouth at bedtime.     No current facility-administered medications on file prior to visit.    BP 138/84   Pulse 90   Temp 98.2 F (36.8 C)   Ht 5' 7.5" (1.715 m)   Wt 213 lb (96.6 kg)   LMP 05/22/2020   SpO2 99%   BMI 32.87 kg/m        Objective:   Physical Exam  General Mental Status- Alert. General Appearance- Not in acute distress.   Skin General: Color- Normal Color. Moisture- Normal Moisture.  Neck Carotid Arteries- Normal color. Moisture- Normal Moisture. No carotid bruits. No JVD.  Chest and Lung Exam Auscultation: Breath Sounds:-Normal.  Cardiovascular Auscultation:Rythm- Regular. Murmurs & Other Heart Sounds:Auscultation of the heart reveals- No Murmurs.  Abdomen Inspection:-Inspeection Normal. Palpation/Percussion:Note:No mass. Palpation and Percussion of the abdomen reveal- Non Tender, Non Distended + BS, no rebound or guarding.   Neurologic Cranial Nerve exam:- CN III-XII intact(No nystagmus), symmetric smile. Strength:- 5/5 equal and symmetric strength both upper and lower extremities.      Assessment & Plan:    Patient Instructions  For you wellness exam today I have ordered cbc, cmp and  lipid panel.   Declined flu vaccine  Placed order for screening mammogram  Recommend exercise and healthy diet.  We will let you know lab results as they come in.  Follow up date appointment will be determined after lab review.    Will wait for your records from psychiatrist and then will need to clarify with office manager regarding making  changes/taking out dx that were placed.  Smoker- discussed/encouraged to quit smoking.          Mackie Pai, PA-C

## 2022-03-03 NOTE — Patient Instructions (Addendum)
For you wellness exam today I have ordered cbc, cmp and  lipid panel.   Declined flu vaccine  Placed order for screening mammogram  Recommend exercise and healthy diet.  We will let you know lab results as they come in.  Follow up date appointment will be determined after lab review.    Will wait for your records from psychiatrist and then will need to clarify with office manager regarding making changes/taking out dx(misdiagnosis) that were placed in past.  Smoker- discussed/encouraged to quit smoking.   Preventive Care 41 Years Old, Female Preventive care refers to lifestyle choices and visits with your health care provider that can promote health and wellness. Preventive care visits are also called wellness exams. What can I expect for my preventive care visit? Counseling Your health care provider may ask you questions about your: Medical history, including: Past medical problems. Family medical history. Pregnancy history. Current health, including: Menstrual cycle. Method of birth control. Emotional well-being. Home life and relationship well-being. Sexual activity and sexual health. Lifestyle, including: Alcohol, nicotine or tobacco, and drug use. Access to firearms. Diet, exercise, and sleep habits. Work and work Statistician. Sunscreen use. Safety issues such as seatbelt and bike helmet use. Physical exam Your health care provider will check your: Height and weight. These may be used to calculate your BMI (body mass index). BMI is a measurement that tells if you are at a healthy weight. Waist circumference. This measures the distance around your waistline. This measurement also tells if you are at a healthy weight and may help predict your risk of certain diseases, such as type 2 diabetes and high blood pressure. Heart rate and blood pressure. Body temperature. Skin for abnormal spots. What immunizations do I need?  Vaccines are usually given at various ages,  according to a schedule. Your health care provider will recommend vaccines for you based on your age, medical history, and lifestyle or other factors, such as travel or where you work. What tests do I need? Screening Your health care provider may recommend screening tests for certain conditions. This may include: Lipid and cholesterol levels. Diabetes screening. This is done by checking your blood sugar (glucose) after you have not eaten for a while (fasting). Pelvic exam and Pap test. Hepatitis B test. Hepatitis C test. HIV (human immunodeficiency virus) test. STI (sexually transmitted infection) testing, if you are at risk. Lung cancer screening. Colorectal cancer screening. Mammogram. Talk with your health care provider about when you should start having regular mammograms. This may depend on whether you have a family history of breast cancer. BRCA-related cancer screening. This may be done if you have a family history of breast, ovarian, tubal, or peritoneal cancers. Bone density scan. This is done to screen for osteoporosis. Talk with your health care provider about your test results, treatment options, and if necessary, the need for more tests. Follow these instructions at home: Eating and drinking  Eat a diet that includes fresh fruits and vegetables, whole grains, lean protein, and low-fat dairy products. Take vitamin and mineral supplements as recommended by your health care provider. Do not drink alcohol if: Your health care provider tells you not to drink. You are pregnant, may be pregnant, or are planning to become pregnant. If you drink alcohol: Limit how much you have to 0-1 drink a day. Know how much alcohol is in your drink. In the [place] one drink equals one 12 oz bottle of beer (355 mL), one 5 oz glass of wine (148 mL),  or one 1 oz glass of hard liquor (44 mL). Lifestyle Brush your teeth every morning and night with fluoride toothpaste. Floss one time each  day. Exercise for at least 30 minutes 5 or more days each week. Do not use any products that contain nicotine or tobacco. These products include cigarettes, chewing tobacco, and vaping devices, such as e-cigarettes. If you need help quitting, ask your health care provider. Do not use drugs. If you are sexually active, practice safe sex. Use a condom or other form of protection to prevent STIs. If you do not wish to become pregnant, use a form of birth control. If you plan to become pregnant, see your health care provider for a prepregnancy visit. Take aspirin only as told by your health care provider. Make sure that you understand how much to take and what form to take. Work with your health care provider to find out whether it is safe and beneficial for you to take aspirin daily. Find healthy ways to manage stress, such as: Meditation, yoga, or listening to music. Journaling. Talking to a trusted person. Spending time with friends and family. Minimize exposure to UV radiation to reduce your risk of skin cancer. Safety Always wear your seat belt while driving or riding in a vehicle. Do not drive: If you have been drinking alcohol. Do not ride with someone who has been drinking. When you are tired or distracted. While texting. If you have been using any mind-altering substances or drugs. Wear a helmet and other protective equipment during sports activities. If you have firearms in your house, make sure you follow all gun safety procedures. Seek help if you have been physically or sexually abused. What's next? Visit your health care provider once a year for an annual wellness visit. Ask your health care provider how often you should have your eyes and teeth checked. Stay up to date on all vaccines. This information is not intended to replace advice given to you by your health care provider. Make sure you discuss any questions you have with your health care provider. Document Revised:  09/12/2020 Document Reviewed: 09/12/2020 Elsevier Patient Education  [place].

## 2022-03-04 LAB — CBC WITH DIFFERENTIAL/PLATELET
Basophils Absolute: 0.1 10*3/uL (ref 0.0–0.1)
Basophils Relative: 0.6 % (ref 0.0–3.0)
Eosinophils Absolute: 0.1 10*3/uL (ref 0.0–0.7)
Eosinophils Relative: 0.8 % (ref 0.0–5.0)
HCT: 41.9 % (ref 36.0–46.0)
Hemoglobin: 14.4 g/dL (ref 12.0–15.0)
Lymphocytes Relative: 31.6 % (ref 12.0–46.0)
Lymphs Abs: 3 10*3/uL (ref 0.7–4.0)
MCHC: 34.5 g/dL (ref 30.0–36.0)
MCV: 95.8 fl (ref 78.0–100.0)
Monocytes Absolute: 0.5 10*3/uL (ref 0.1–1.0)
Monocytes Relative: 5 % (ref 3.0–12.0)
Neutro Abs: 5.8 10*3/uL (ref 1.4–7.7)
Neutrophils Relative %: 62 % (ref 43.0–77.0)
Platelets: 223 10*3/uL (ref 150.0–400.0)
RBC: 4.38 Mil/uL (ref 3.87–5.11)
RDW: 13.3 % (ref 11.5–15.5)
WBC: 9.4 10*3/uL (ref 4.0–10.5)

## 2022-03-04 LAB — LIPID PANEL
Cholesterol: 233 mg/dL — ABNORMAL HIGH (ref 0–200)
HDL: 66.5 mg/dL (ref >39.00)
LDL Cholesterol: 146 mg/dL — ABNORMAL HIGH (ref 0–99)
NonHDL: 166.95
Total CHOL/HDL Ratio: 4
Triglycerides: 105 mg/dL (ref 0.0–149.0)
VLDL: 21 mg/dL (ref 0.0–40.0)

## 2022-03-04 LAB — COMPREHENSIVE METABOLIC PANEL
ALT: 28 U/L (ref 0–35)
AST: 18 U/L (ref 0–37)
Albumin: 4.3 g/dL (ref 3.5–5.2)
Alkaline Phosphatase: 92 U/L (ref 39–117)
BUN: 20 mg/dL (ref 6–23)
CO2: 27 mEq/L (ref 19–32)
Calcium: 9.5 mg/dL (ref 8.4–10.5)
Chloride: 101 mEq/L (ref 96–112)
Creatinine, Ser: 0.71 mg/dL (ref 0.40–1.20)
GFR: 104.22 mL/min (ref >60.00)
Glucose, Bld: 88 mg/dL (ref 70–99)
Potassium: 4.5 mEq/L (ref 3.5–5.1)
Sodium: 137 mEq/L (ref 135–145)
Total Bilirubin: 0.5 mg/dL (ref 0.2–1.2)
Total Protein: 6.8 g/dL (ref 6.0–8.3)

## 2022-03-05 ENCOUNTER — Ambulatory Visit (HOSPITAL_BASED_OUTPATIENT_CLINIC_OR_DEPARTMENT_OTHER)
Admission: RE | Admit: 2022-03-05 | Discharge: 2022-03-05 | Disposition: A | Discharge status: Home / Self Care | Payer: Commercial Managed Care - PPO | Source: Ambulatory Visit | Attending: Medical | Admitting: Medical

## 2022-03-05 ENCOUNTER — Encounter (HOSPITAL_BASED_OUTPATIENT_CLINIC_OR_DEPARTMENT_OTHER): Payer: Self-pay

## 2022-03-05 DIAGNOSIS — Z1231 Encounter for screening mammogram for malignant neoplasm of breast: Secondary | ICD-10-CM | POA: Diagnosis present

## 2022-03-23 ENCOUNTER — Encounter: Payer: Self-pay | Admitting: Medical

## 2022-03-25 ENCOUNTER — Encounter: Payer: Self-pay | Admitting: Medical

## 2022-03-25 NOTE — Telephone Encounter (Signed)
Did take out bipolar from pt chart today. Deleted dx based on letter her psychiatrist sent me stating to the best of his/her knowledge pt did not have bipolar depression. Some comments on major depression dx and meds used for  unipolar depression

## 2022-03-26 ENCOUNTER — Encounter: Payer: Self-pay | Admitting: Medical

## 2022-03-26 ENCOUNTER — Telehealth (INDEPENDENT_AMBULATORY_CARE_PROVIDER_SITE_OTHER): Payer: Commercial Managed Care - PPO | Admitting: Medical

## 2022-03-26 DIAGNOSIS — U071 COVID-19: Secondary | ICD-10-CM | POA: Diagnosis not present

## 2022-03-26 MED ORDER — MOLNUPIRAVIR EUA 200MG CAPSULE: 4.0000 | ORAL_CAPSULE | Freq: Two times a day (BID) | ORAL | 0 refills | Status: AC

## 2022-03-26 MED ORDER — FLUTICASONE PROPIONATE 50 MCG/ACT NA SUSP: 2.0000 | Freq: Every day | NASAL | 1 refills | Status: DC

## 2022-03-26 MED ORDER — BENZONATATE 100 MG PO CAPS: 100.0000 mg | ORAL_CAPSULE | Freq: Three times a day (TID) | ORAL | 0 refills | Status: DC | PRN

## 2022-03-26 NOTE — Progress Notes (Signed)
   Subjective:    Patient ID: Angelica Diaz, female    DOB: [birth year], 41 y.o.   MRN: [id number]  HPI  Virtual Visit via Video Note  I connected with VASILISA VORE on 03/26/22 at  1:00 PM EST by a video enabled telemedicine application and verified that I am speaking with the correct person using two identifiers.  Location: Patient: home Provider: offie   I discussed the limitations of evaluation and management by telemedicine and the availability of in person appointments. The patient expressed understanding and agreed to proceed.  History of Present Illness: Sunday night got ha and when woke up on Monday had st, itchy throat and nasal congestion.(+covid test on Monday) Pt states yesterday she had fever, increasing nasal congestion and soreness from head to toe.   Pt thinks had 4 covid vaccines in the past. Also had covid December of 2020.  No wheezing or shortness of breath.  This morning temp was 100.8   Observations/Objective:  General-no acute distress,but does look fatigued and teary eyed/in pain diffuse aches. Lungs- on inspection lungs appear unlabored. Neck- no tracheal deviation or jvd on inspection. Neuro- gross motor function appears intact.   Assessment and Plan:  Patient Instructions  Day 3 of moderate overall COVID infection will describes severe diffuse body aches.  However no shortness of breath or wheezing.  Vaccinated in the past 4 times per patient and had 1 actual COVID infection.  I recommend Tylenol and ibuprofen as discussed for fever and bodyaches.  Flonase for nasal congestion, benzonatate for cough and vitamin D 2000 to 4000 international units 1 tab daily.  Discussed that antiviral molnupiravir can be given but is not clearly indicated in her age and her medical history.  But with her described symptoms decided to make available in the event that she worsens over the next 2 days.  Rx advisement given.  Also explained to patient that if she  develops secondary bacterial infection signs and symptoms such as bronchitis, sinus infection or severe ear pain let me know and would prescribe azithromycin.  Counseled on watching O2 saturation numbers.  If any numbers less than 94% let us know.  Any severe signs symptoms change recommend ED evaluation  Follow-up in 7 to 10 days or sooner if needed.   .mec Follow Up Instructions:    I discussed the assessment and treatment plan with the patient. The patient was provided an opportunity to ask questions and all were answered. The patient agreed with the plan and demonstrated an understanding of the instructions.   The patient was advised to call back or seek an in-person evaluation if the symptoms worsen or if the condition fails to improve as anticipated.     Mackie Pai, PA-C   Review of Systems     Objective:   Physical Exam        Assessment & Plan:

## 2022-03-26 NOTE — Patient Instructions (Addendum)
Day 3 of moderate overall COVID infection will describes severe diffuse body aches.  However no shortness of breath or wheezing.  Vaccinated in the past 4 times per patient and had 1 actual COVID infection.  I recommend Tylenol and ibuprofen as discussed for fever and bodyaches.  Flonase for nasal congestion, benzonatate for cough and vitamin D 2000 to 4000 international units 1 tab daily.  Discussed that antiviral molnupiravir can be given but is not clearly indicated in her age and her medical history.  But with her described symptoms decided to make available in the event that she worsens over the next 2 days.  Rx advisement given.  Also explained to patient that if she develops secondary bacterial infection signs and symptoms such as bronchitis, sinus infection or severe ear pain let me know and would prescribe azithromycin.  Counseled on watching O2 saturation numbers.  If any numbers less than 94% let us know.  Any severe signs symptoms change recommend ED evaluation  Follow-up in 7 to 10 days or sooner if needed.

## 2022-04-17 ENCOUNTER — Other Ambulatory Visit: Payer: Self-pay | Admitting: Medical

## 2022-06-05 ENCOUNTER — Ambulatory Visit: Payer: Commercial Managed Care - PPO | Admitting: Orthopedic Surgery

## 2022-06-05 ENCOUNTER — Ambulatory Visit (INDEPENDENT_AMBULATORY_CARE_PROVIDER_SITE_OTHER): Payer: Commercial Managed Care - PPO

## 2022-06-05 ENCOUNTER — Encounter: Payer: Self-pay | Admitting: Orthopedic Surgery

## 2022-06-05 VITALS — BP 109/78 | HR 6 | Ht 67.5 in | Wt 213.0 lb

## 2022-06-05 DIAGNOSIS — M533 Sacrococcygeal disorders, not elsewhere classified: Secondary | ICD-10-CM | POA: Diagnosis not present

## 2022-06-05 MED ORDER — METHYLPREDNISOLONE 4 MG PO TBPK: ORAL_TABLET | ORAL | 0 refills | Status: DC

## 2022-06-05 NOTE — Progress Notes (Signed)
Orthopedic Spine Surgery Office Note  Assessment: Patient is a 42 y.o. female with acute coccydynia   Plan: -Explained that initially conservative treatment is tried as a significant number of patients may experience relief with these treatment modalities. Discussed that the conservative treatments include:  -activity modification  -over the counter pain medications  -medrol dosepak  -steroid injections -Patient has tried ibuprofen, voltaren gel  -Recommended offloading the area with pillows and donut pillow, medrol dose pak (prescribed), stop ibuprofen until completing the steroid then can restart -If she does not get better, will next recommend a steroid injection to the area -Patient should return to office in 3 weeks, x-rays at next visit: none   Patient expressed understanding of the plan and all questions were answered to the patient's satisfaction.   ___________________________________________________________________________   History:  Patient is a 42 y.o. female who presents today for midline buttock pain. Patient has had low midline buttock around her 'tailbone' for the last 4-5 days. There was no trauma or injury that brought on the pain. She said she injured her coccyx as a kid and has had on and off issues with soreness in the area ever since. However, it is usually tolerable and resolves with time. This is different as it is much more severe. Pain is worse if sitting on it or laying on it. Gets better with standing. She stated that if she could, she would sleep standing. Ibuprofen helps take the edge off the pain but it is still significant. No pain elsewhere. Denies paresthesias and numbness.    Weakness: denies Symptoms of imbalance: denies Paresthesias and numbness: denies Bowel or bladder incontinence: denies Saddle anesthesia: denies  Treatments tried: ibuprofen, voltaren gel  Review of systems: Denies fevers and chills, night sweats, unexplained weight loss,  history of cancer. Has had pain that wakes her at night  Past medical history: Migraines Irritable bowel syndrome Bipolar  Allergies: NKDA  Past surgical history:  C section Hysterectomy Cholecystectomy D&C Novasure ablation  Social history: Reports use of nicotine product (smoking, vaping, patches, smokeless) -1/3 ppd Alcohol use: denies Denies recreational drug use   Physical Exam:  General: no acute distress, appears stated age Neurologic: alert, answering questions appropriately, following commands Respiratory: unlabored breathing on room air, symmetric chest rise Psychiatric: appropriate affect, normal cadence to speech   MSK (spine):  -Strength exam      Left  Right EHL    5/5  5/5 TA    5/5  5/5 GSC    5/5  5/5 Knee extension  5/5  5/5 Hip flexion   5/5  5/5  -Sensory exam    Sensation intact to light touch in L3-S1 nerve distributions of bilateral lower extremities  -Achilles DTR: 2/4 on the left, 2/4 on the right -Patellar tendon DTR: 2/4 on the left, 2/4 on the right  -Straight leg raise: negative bilaterally  -Femoral nerve stretch test: negative bilaterally  -Clonus: no beats bilaterally  -TTP over the coccyx. No other tenderness to palpation including over the SI joints  -Left hip exam: no pain through range of motion, negative stinchfield, negative faber, negative si joint compression test -Right hip exam: no pain through range of motion, negative stinchfield, negative faber, negative si joint compression test  Imaging: XR of the sacrum/coccyx spine from 06/05/2022 was independently reviewed and interpreted, showing no fracture or dislocation. No lesions seen. No significant degenerative changes seen within the hips.    Patient name: Angelica Diaz Patient MRN: [id number] Date of  visit: 06/05/22

## 2022-06-12 ENCOUNTER — Ambulatory Visit (HOSPITAL_BASED_OUTPATIENT_CLINIC_OR_DEPARTMENT_OTHER): Payer: Commercial Managed Care - PPO | Admitting: Orthopaedic Surgery

## 2022-06-25 ENCOUNTER — Ambulatory Visit: Payer: Commercial Managed Care - PPO | Admitting: Orthopedic Surgery

## 2022-06-25 ENCOUNTER — Telehealth: Payer: Self-pay | Admitting: Medical

## 2022-06-25 DIAGNOSIS — M533 Sacrococcygeal disorders, not elsewhere classified: Secondary | ICD-10-CM

## 2022-06-25 NOTE — Telephone Encounter (Signed)
Pt called and lvm to return call 

## 2022-06-25 NOTE — Progress Notes (Signed)
Orthopedic Spine Surgery Office Note   Assessment: Patient is a 42 y.o. female with acute coccydynia     Plan: -Explained that initially conservative treatment is tried as a significant number of patients may experience relief with these treatment modalities. Discussed that the conservative treatments include:             -activity modification             -over the counter pain medications             -medrol dosepak             -steroid injections -Patient has tried ibuprofen, voltaren gel, medrol dose pak -She should continue to try to offload the area is much as possible.  Avoid shearing over the coccyx.  She can resume NSAIDs now that she is done with the Medrol Dosepak -Recommended a steroid injection to the coccyx for additional pain relief -Patient should return to office on an as-needed basis     Patient expressed understanding of the plan and all questions were answered to the patient's satisfaction.    ___________________________________________________________________________     History:   Patient is a 42 y.o. female who presents today for midline buttock pain.  Patient has been still hurting since the last time she was seen.  She says she did get some relief with the Medrol Dosepak but is still feeling pain over the coccyx.  She feels that on a daily basis.  Is worse when she sits on the area.  She has no pain radiating down either lower extremity.  Denies paresthesias and numbness.     Treatments tried: ibuprofen, voltaren gel, medrol dose pak    Physical Exam:   General: no acute distress, appears stated age Neurologic: alert, answering questions appropriately, following commands Respiratory: unlabored breathing on room air, symmetric chest rise Psychiatric: appropriate affect, normal cadence to speech     MSK (spine):   -Strength exam                                                   Left                  Right EHL                              5/5                   5/5 TA                                 5/5                  5/5 GSC                             5/5                  5/5 Knee extension            5/5                  5/5 Hip flexion  5/5                  5/5   -Sensory exam                           Sensation intact to light touch in L3-S1 nerve distributions of bilateral lower extremities   -Achilles DTR: 2/4 on the left, 2/4 on the right -Patellar tendon DTR: 2/4 on the left, 2/4 on the right   -Straight leg raise: negative bilaterally  -Femoral nerve stretch test: negative bilaterally  -Clonus: no beats bilaterally   -TTP over the coccyx and just to the right of the coccyx. No other tenderness to palpation including over the SI joints   -Left hip exam: no pain through range of motion, negative stinchfield, negative faber, negative si joint compression test -Right hip exam: no pain through range of motion, negative stinchfield, negative faber, negative si joint compression test   Imaging: XR of the sacrum/coccyx spine from 06/05/2022 was previously independently reviewed and interpreted, showing no fracture or dislocation. No lesions seen. No significant degenerative changes seen within the hips.      Patient name: Angelica Diaz Patient MRN: [id number] Date of visit: 06/25/22

## 2022-06-25 NOTE — Telephone Encounter (Signed)
Pt states she got scratched by her cat and has broken out in hives on her face and stomach. She was worried this might be cat scratch fever and wanted to know if she could wait. Offered apps today but she declined and transferred to triage to see if it is okay to wait.

## 2022-06-26 NOTE — Telephone Encounter (Signed)
Called pt today , declined appt . Stated she is doing better , she took benadryl and believes it wasn't hives but contact dermatitis , stated if her sx come back or she starts to feel bad , to schedule an appt . Pt voiced understanding.

## 2022-10-13 ENCOUNTER — Ambulatory Visit: Payer: Commercial Managed Care - PPO | Admitting: Medical

## 2022-11-09 ENCOUNTER — Emergency Department (HOSPITAL_BASED_OUTPATIENT_CLINIC_OR_DEPARTMENT_OTHER)
Admission: EM | Admit: 2022-11-09 | Discharge: 2022-11-09 | Discharge status: Against Medical Advice | Payer: Commercial Managed Care - PPO | Source: Home / Self Care

## 2022-11-09 DIAGNOSIS — W19XXXA Unspecified fall, initial encounter: Secondary | ICD-10-CM | POA: Insufficient documentation

## 2022-11-09 DIAGNOSIS — Z5321 Procedure and treatment not carried out due to patient leaving prior to being seen by health care provider: Secondary | ICD-10-CM | POA: Diagnosis not present

## 2022-11-09 DIAGNOSIS — S0990XA Unspecified injury of head, initial encounter: Secondary | ICD-10-CM | POA: Diagnosis not present

## 2022-11-10 ENCOUNTER — Telehealth: Payer: Self-pay | Admitting: Medical

## 2022-11-10 ENCOUNTER — Emergency Department (HOSPITAL_BASED_OUTPATIENT_CLINIC_OR_DEPARTMENT_OTHER): Payer: Commercial Managed Care - PPO

## 2022-11-10 ENCOUNTER — Other Ambulatory Visit: Payer: Self-pay

## 2022-11-10 ENCOUNTER — Emergency Department (HOSPITAL_BASED_OUTPATIENT_CLINIC_OR_DEPARTMENT_OTHER)
Admission: EM | Admit: 2022-11-10 | Discharge: 2022-11-10 | Disposition: A | Discharge status: Home / Self Care | Payer: Commercial Managed Care - PPO

## 2022-11-10 ENCOUNTER — Encounter (HOSPITAL_BASED_OUTPATIENT_CLINIC_OR_DEPARTMENT_OTHER): Payer: Self-pay | Admitting: Emergency Medicine

## 2022-11-10 DIAGNOSIS — W0110XA Fall on same level from slipping, tripping and stumbling with subsequent striking against unspecified object, initial encounter: Secondary | ICD-10-CM | POA: Diagnosis not present

## 2022-11-10 DIAGNOSIS — R42 Dizziness and giddiness: Secondary | ICD-10-CM

## 2022-11-10 DIAGNOSIS — R6884 Jaw pain: Secondary | ICD-10-CM | POA: Insufficient documentation

## 2022-11-10 DIAGNOSIS — M62838 Other muscle spasm: Secondary | ICD-10-CM | POA: Insufficient documentation

## 2022-11-10 DIAGNOSIS — Z86011 Personal history of benign neoplasm of the brain: Secondary | ICD-10-CM | POA: Insufficient documentation

## 2022-11-10 DIAGNOSIS — S0990XA Unspecified injury of head, initial encounter: Secondary | ICD-10-CM | POA: Diagnosis present

## 2022-11-10 DIAGNOSIS — S060X1A Concussion with loss of consciousness of 30 minutes or less, initial encounter: Secondary | ICD-10-CM

## 2022-11-10 DIAGNOSIS — Y92000 Kitchen of unspecified non-institutional (private) residence as  the place of occurrence of the external cause: Secondary | ICD-10-CM | POA: Diagnosis not present

## 2022-11-10 MED ORDER — MECLIZINE HCL 25 MG PO TABS
25.0000 mg | ORAL_TABLET | Freq: Three times a day (TID) | ORAL | 0 refills | Status: DC | PRN
Start: 2022-11-10 — End: 2023-09-04

## 2022-11-10 NOTE — Telephone Encounter (Signed)
Pt states she hit her head pretty hard on Friday night and thinks she might have a concussion. She asked to be seen, transferred to triage for nurse eval.

## 2022-11-10 NOTE — ED Notes (Signed)
Pt seen by EDPA, assessed and d/c'd prior to RN assessment, see PA notes, orders received to d/c initiated. Pt alert, NAD, calm, interactive. Steady gait. Reports some dizziness. Denies other sx currently. Friend present in ED as ride.Denies questions.

## 2022-11-10 NOTE — ED Notes (Signed)
EDPA at [facility]. Pt alert, NAD, calm, cooperative, participatory. Pending imaging results.

## 2022-11-10 NOTE — Discharge Instructions (Signed)
Please read and follow all provided instructions.  Your diagnoses today include:  1. Concussion with loss of consciousness of 30 minutes or less, initial encounter   2. Cervical paraspinal muscle spasm   3. Dizziness     Tests performed today include: CT scan of your head and c-spine that did not show any serious injury.  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 Vital signs. See below for your results today.   Medications prescribed:  Meclizine: Medication for dizziness and vertigo  Take any prescribed medications only as directed.  Home care instructions:  Follow any educational materials contained in this packet.  BE VERY CAREFUL not to take multiple medicines containing Tylenol (also called acetaminophen). Doing so can lead to an overdose which can damage your liver and cause liver failure and possibly death.   Follow-up instructions: Please follow-up with your primary care provider in the next 3-5 days for further evaluation of your symptoms if not improving.   Return instructions:  SEEK IMMEDIATE MEDICAL ATTENTION IF: There is confusion or drowsiness (although children frequently become drowsy after injury).  You cannot awaken the injured person.  You have more than one episode of vomiting.  You notice dizziness or unsteadiness which is getting worse, or inability to walk.  You have convulsions or unconsciousness.  You experience severe, persistent headaches not relieved by Tylenol. You cannot use arms or legs normally.  There are changes in pupil sizes. (This is the black center in the colored part of the eye)  There is clear or bloody discharge from the nose or ears.  You have change in speech, vision, swallowing, or understanding.  Localized weakness, numbness, tingling, or change in bowel or bladder control. You have any other emergent concerns.  Additional Information: You have had a head injury which  does not appear to require admission at this time.  Your vital signs today were: BP 121/83   Pulse 62   Temp 98.1 F (36.7 C) (Oral)   Resp 16   Wt 95.3 kg   LMP 05/22/2020   SpO2 99%   BMI 32.41 kg/m  If your blood pressure (BP) was elevated above 135/85 this visit, please have this repeated by your doctor within one month. --------------

## 2022-11-10 NOTE — Telephone Encounter (Signed)
Pt was went to ED yesterday but left prior assessment

## 2022-11-10 NOTE — Telephone Encounter (Signed)
Pt went back to ED today , notes in chart. Spoke with pt over phone stated she did get seen in ED today didn't leave

## 2022-11-10 NOTE — ED Provider Notes (Signed)
[facility] Provider Note   CSN: [id number] Arrival date & time: 11/10/22  7829     History  Chief Complaint  Patient presents with   Marletta Lor    Angelica Diaz is a 42 y.o. female.  Patient with history of meningioma presents emergency department today for symptoms starting 3 days ago after a head injury.  Patient states that she got out of bed and slipped, causing her to fall backwards.  She struck her head on the ground but does not remember this.  A friend who was with her said that she lost consciousness for at least a few seconds.  Patient remembers waking up on the ground.  She did have some bleeding to her scalp and hematoma which is resolving.  Since that time she has had dizziness described as vertigo.  This occurs with certain movements of her head.  She has had some discomfort with bright lights and loud sounds.  No sleep difficulty.  She has had some trouble focusing.  She was having difficulty walking 2 days ago, without assistance, due to the extreme dizziness, however this has improved every day since that time.  No vomiting.  No vision changes.  She states that she called her PCP and was encouraged to come to the emergency department today.  She does report fullness in her neck that feels "like the muscles" and some soreness in her jaw.  No dental injury.       Home Medications Prior to Admission medications   Medication Sig Start Date End Date Taking? Authorizing Provider  amphetamine-dextroamphetamine (ADDERALL) 20 MG tablet Take 20 mg by mouth every morning. 01/31/22   [provider]  benzonatate (TESSALON) 100 MG capsule Take 1 capsule (100 mg total) by mouth 3 (three) times daily as needed for cough. 03/26/22   Saguier, Ramon Dredge, PA-C  cloNIDine (CATAPRES) 0.2 MG tablet Take 0.6 mg by mouth at bedtime. 02/07/20   [provider]  dicyclomine (BENTYL) 20 MG tablet TAKE 1 TABLET BY MOUTH EVERY 6 HOURS 07/12/21    Jenel Lucks, MD  eszopiclone (LUNESTA) 1 MG TABS tablet Take 1 mg by mouth at bedtime. 09/04/21   [provider]  fluticasone (FLONASE) 50 MCG/ACT nasal spray SPRAY 2 SPRAYS INTO EACH NOSTRIL EVERY DAY 04/17/22   Saguier, Ramon Dredge, PA-C  methylPREDNISolone (MEDROL DOSEPAK) 4 MG TBPK tablet Take as prescribed on the box 06/05/22   London Sheer, MD  QUEtiapine (SEROQUEL) 50 MG tablet Take 50 mg by mouth at bedtime. 08/19/21   [provider]      Allergies    Patient has no known allergies.    Review of Systems   Review of Systems  Physical Exam Updated Vital Signs BP 121/83   Pulse 62   Temp 98.1 F (36.7 C) (Oral)   Resp 16   Wt 95.3 kg   LMP 05/22/2020   SpO2 99%   BMI 32.41 kg/m  Physical Exam Vitals and nursing note reviewed.  Constitutional:      Appearance: She is well-developed.  HENT:     Head: Normocephalic. No raccoon eyes or Battle's sign.     Comments: Patient does have a hematoma to the left vertex area.  No open wounds.  Some residual dried blood in hair noted.    Right Ear: Tympanic membrane, ear canal and external ear normal. No hemotympanum.     Left Ear: Tympanic membrane, ear canal and external ear normal.  No hemotympanum.     Nose: Nose normal.     Mouth/Throat:     Mouth: Mucous membranes are moist.     Pharynx: Uvula midline.     Comments: Patient reports some soreness opening closing her jaw that has full range of motion, no clicking or popping observed. Eyes:     General: Lids are normal.     Extraocular Movements:     Right eye: No nystagmus.     Left eye: No nystagmus.     Conjunctiva/sclera: Conjunctivae normal.     Pupils: Pupils are equal, round, and reactive to light.     Comments: No visible hyphema noted  Cardiovascular:     Rate and Rhythm: Normal rate and regular rhythm.  Pulmonary:     Effort: Pulmonary effort is normal.     Breath sounds: Normal breath sounds.  Abdominal:     Palpations: Abdomen is soft.      Tenderness: There is no abdominal tenderness.  Musculoskeletal:     Cervical back: Normal range of motion and neck supple. Tenderness present. No bony tenderness.     Thoracic back: No tenderness or bony tenderness.     Lumbar back: No tenderness or bony tenderness.     Comments: Cervical paraspinous muscular tenderness.  Full range of motion with mild discomfort.  Skin:    General: Skin is warm and dry.  Neurological:     Mental Status: She is alert and oriented to person, place, and time.     GCS: GCS eye subscore is 4. GCS verbal subscore is 5. GCS motor subscore is 6.     Cranial Nerves: No cranial nerve deficit.     Sensory: No sensory deficit.     Motor: No weakness.     Coordination: Coordination normal.     Deep Tendon Reflexes: Reflexes are normal and symmetric.     ED Results / Procedures / Treatments   Labs (all labs ordered are listed, but only abnormal results are displayed) Labs Reviewed - No data to display  EKG None  Radiology CT Cervical Spine Wo Contrast  Result Date: 11/10/2022 CLINICAL DATA:  42 year old female with dizziness, syncope, fall. History of small left frontal convexity meningioma. EXAM: CT CERVICAL SPINE WITHOUT CONTRAST TECHNIQUE: Multidetector CT imaging of the cervical spine was performed without intravenous contrast. Multiplanar CT image reconstructions were also generated. RADIATION DOSE REDUCTION: This exam was performed according to the departmental dose-optimization program which includes automated exposure control, adjustment of the mA and/or kV according to patient size and/or use of iterative reconstruction technique. COMPARISON:  Head CT today. FINDINGS: Alignment: Straightening of cervical lordosis. Cervicothoracic junction alignment is within normal limits. Bilateral posterior element alignment is within normal limits. Skull base and vertebrae: Bone mineralization is within normal limits. Visualized skull base is intact. No  atlanto-occipital dissociation. C1 and C2 appear intact and aligned. No acute osseous abnormality identified. Soft tissues and spinal canal: No prevertebral fluid or swelling. No visible canal hematoma. Negative visible noncontrast neck soft tissues aside from partial atrophy of the left submandibular gland. Disc levels: Cervical disc and endplate degeneration C4-C5 through C6-C7. Questionable mild associated spinal stenosis at C5-C6. Upper chest: Negative. IMPRESSION: 1. No acute traumatic injury identified in the cervical spine. 2. 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 Electronically Signed   By: Odessa Fleming M.D.   On: 11/10/2022 09:56   CT Head Wo Contrast  Result Date: 11/10/2022 CLINICAL DATA:  42 year old  female with dizziness, syncope, fall. History of small left frontal convexity meningioma. EXAM: CT HEAD WITHOUT CONTRAST TECHNIQUE: Contiguous axial images were obtained from the base of the skull through the vertex without intravenous contrast. RADIATION DOSE REDUCTION: This exam was performed according to the departmental dose-optimization program which includes automated exposure control, adjustment of the mA and/or kV according to patient size and/or use of iterative reconstruction technique. COMPARISON:  Brain MRI 11/06/2018. FINDINGS: Brain: Small calcified left superior frontal convexity meningioma on coronal image 51 appears stable since 2020, about 9 mm. No associated mass effect or cerebral edema. Cerebral volume remains normal. No midline shift, ventriculomegaly, intracranial hemorrhage or evidence of cortically based acute infarction. Gray-white matter differentiation is within normal limits throughout the brain. Vascular: No suspicious intracranial vascular hyperdensity. Skull: No acute osseous abnormality identified. Sinuses/Orbits: Tympanic cavities, Visualized paranasal sinuses and mastoids are clear. Other: Broad-base but mild left vertex scalp hematoma posteriorly on  series 3, image 74. Underlying calvarium appears intact. Visualized orbit soft tissues are within normal limits. IMPRESSION: 1. Left vertex scalp hematoma. No underlying skull fracture. 2. No acute intracranial abnormality. A small left frontal convexity meningioma appears stable since 2020. Electronically Signed   By: Odessa Fleming M.D.   On: 11/10/2022 09:54    Procedures Procedures    Medications Ordered in ED Medications - No data to display  ED Course/ Medical Decision Making/ A&P    Patient seen and examined. History obtained directly from patient. Work-up including labs, imaging, EKG ordered in triage, if performed, were reviewed.    Labs/EKG: None ordered  Imaging: Independently reviewed and interpreted.  This included: CT head and CT cervical spine, agree no fractures, intracranial injury.  Medications/Fluids: None ordered  Most recent vital signs reviewed and are as follows: BP 121/83   Pulse 62   Temp 98.1 F (36.7 C) (Oral)   Resp 16   Wt 95.3 kg   LMP 05/22/2020   SpO2 99%   BMI 32.41 kg/m   Initial impression: Head injury with concussion, reassuring imaging  Patient made aware of stable meningioma and disc disease in the cervical spine.  Home treatment plan: We discussed cognitive rest and avoidance of activities which makes her symptoms worse.  Will try meclizine to see if this helps with her dizziness, although this is improving.  She will use over-the-counter medications for muscle aches and pains.  Return instructions discussed with patient: Patient was counseled on head injury precautions and symptoms that should indicate their return to the ED.  These include severe worsening headache, vision changes, confusion, loss of consciousness, trouble walking, nausea & vomiting, or weakness/tingling in extremities.    Follow-up instructions discussed with patient: Follow-up with PCP for recheck in 3 to 5 days if not improving                                Medical  Decision Making Amount and/or Complexity of Data Reviewed Radiology: ordered.   Patient with head injury after mechanical fall, now 3 days out.  She has symptoms which are consistent with concussion.  Head imaging and cervical spine imaging are negative and reassuring.  Overall her symptoms have been gradually improving but are not resolved.  Will continue symptomatic care and cognitive rest.        Final Clinical Impression(s) / ED Diagnoses Final diagnoses:  Concussion with loss of consciousness of 30 minutes or less, initial encounter  Cervical  paraspinal muscle spasm  Dizziness    Rx / DC Orders ED Discharge Orders          Ordered    meclizine (ANTIVERT) 25 MG tablet  3 times daily PRN        11/10/22 1009              Renne Crigler, PA-C 11/10/22 1016    Coral Spikes, DO 11/10/22 1519

## 2022-11-10 NOTE — ED Triage Notes (Signed)
Fell x 3 days ago , slipped in kitchen landed backward. Head injury , neck pain .  Syncope post fall  witnessed by spouse ,  Reports dizziness this morning .  Alert and oriented x 4

## 2022-11-11 NOTE — Telephone Encounter (Signed)
Pt notified via mychart

## 2022-12-24 ENCOUNTER — Telehealth: Payer: Self-pay | Admitting: Medical

## 2022-12-24 NOTE — Telephone Encounter (Signed)
Pt called and explained that she had a concussion a month ago and she's concerned because she's been having extreme dizziness & nausea for a few days. I transferred her to speak with a triage nurse.

## 2022-12-24 NOTE — Telephone Encounter (Signed)
Initial Comment Patient had a concussion, she is experiencing dizziness, nausea, and a foggy brain, but not confused. Translation No Nurse Assessment Nurse: Izora Ribas, RN, Melanie Date/Time (Eastern Time): 12/24/2022 11:58:15 AM Confirm and document reason for call. If symptomatic, describe symptoms. ---Caller states dx 11/07/22 concussion, but last few days with dizziness, nausea, and foggy brain, but not confused. Runny nose, sinus issues. Does the patient have any new or worsening symptoms? ---Yes Will a triage be completed? ---Yes Related visit to physician within the last 2 weeks? ---No Does the PT have any chronic conditions? (i.e. diabetes, asthma, this includes High risk factors for pregnancy, etc.) ---Yes List chronic conditions. ---Migraines Is the patient pregnant or possibly pregnant? (Ask all females between the ages of 68-55) ---No Is this a behavioral health or substance abuse call? ---No Guidelines Guideline Title Affirmed Question Affirmed Notes Nurse Date/Time (Eastern Time) Traumatic Brain Injury More than 14 Days Ago Follow-up Call Traumatic Brain Injury (mTBI, concussion) symptoms are getting worse Izora Ribas, RN, Shawna Orleans 12/24/2022 12:00:44 PM PLEASE NOTE: All timestamps contained within this report are represented as [place] Standard Time. CONFIDENTIALTY NOTICE: This fax transmission is intended only for the addressee. It contains information that is legally privileged, confidential or otherwise protected from use or disclosure. If you are not the intended recipient, you are strictly prohibited from reviewing, disclosing, copying using or disseminating any of this information or taking any action in reliance on or regarding this information. If you have received this fax in error, please notify us immediately by telephone so that we can arrange for its return to [place]. Phone: [phone], Toll-Free: [phone], Fax: [phone] Page: 2 of 2 Call Id:  [id number] Disp. Time Lamount Cohen Time) Disposition Final User 12/24/2022 12:02:23 PM SEE PCP WITHIN 3 DAYS Yes Izora Ribas, RN, Melanie Final Disposition 12/24/2022 12:02:23 PM SEE PCP WITHIN 3 DAYS Yes Izora Ribas, RN, Shawna Orleans Caller Disagree/Comply Comply Caller Understands Yes PreDisposition Call Doctor Care Advice Given Per Guideline SEE PCP WITHIN 3 DAYS: * You need to be seen within 2 or 3 days. * PCP VISIT: Call your doctor (or NP/PA) during regular office hours and make an appointment. A clinic or urgent care center are good places to go for care if your doctor's office is closed or you can't get an appointment. NOTE: If office will be open tomorrow, tell caller to call then, not in 3 days. PAIN MEDICINES: CALL BACK IF: * Severe headache persists more than 2 hours after medicines * Vomiting occurs * Weakness of one arm or leg occurs * Slurred or garbled speech occurs * You become worse CARE ADVICE given per Traumatic Brain Injury More than 14 Days Ago Follow-up Call (Adult) guideline

## 2022-12-25 NOTE — Telephone Encounter (Signed)
Pt called and lvm to return call and schedule

## 2023-06-22 ENCOUNTER — Encounter: Payer: Self-pay | Admitting: Medical

## 2023-06-23 ENCOUNTER — Telehealth: Admitting: Physician Assistant

## 2023-06-23 DIAGNOSIS — K589 Irritable bowel syndrome without diarrhea: Secondary | ICD-10-CM | POA: Diagnosis not present

## 2023-06-23 MED ORDER — DICYCLOMINE HCL 20 MG PO TABS
20.0000 mg | ORAL_TABLET | Freq: Four times a day (QID) | ORAL | 0 refills | Status: AC
Start: 2023-06-23 — End: ?

## 2023-06-23 NOTE — Progress Notes (Signed)
 Virtual Visit Consent   [name], you are scheduled for a virtual visit with a [facility] provider today. Just as with appointments in the office, your consent must be obtained to participate. Your consent will be active for this visit and any virtual visit you may have with one of our providers in the next 365 days. If you have a MyChart account, a copy of this consent can be sent to you electronically.  As this is a virtual visit, video technology does not allow for your provider to perform a traditional examination. This may limit your provider's ability to fully assess your condition. If your provider identifies any concerns that need to be evaluated in person or the need to arrange testing (such as labs, EKG, etc.), we will make arrangements to do so. Although advances in technology are sophisticated, we cannot ensure that it will always work on either your end or our end. If the connection with a video visit is poor, the visit may have to be switched to a telephone visit. With either a video or telephone visit, we are not always able to ensure that we have a secure connection.  By engaging in this virtual visit, you consent to the provision of healthcare and authorize for your insurance to be billed (if applicable) for the services provided during this visit. Depending on your insurance coverage, you may receive a charge related to this service.  I need to obtain your verbal consent now. Are you willing to proceed with your visit today? Angelica Diaz has provided verbal consent on 06/23/2023 for a virtual visit (video or telephone). Piedad Climes, [place]  Date: 06/23/2023 1:04 PM   Virtual Visit via Video Note   I, Piedad Climes, connected with  Angelica Diaz  ([id number], 09-Feb-1981) on 06/23/23 at 12:45 PM EDT by a video-enabled telemedicine application and verified that I am speaking with the correct person using two identifiers.  Location: Patient: Virtual Visit  Location Patient: Home Provider: Virtual Visit Location Provider: Home Office   I discussed the limitations of evaluation and management by telemedicine and the availability of in person appointments. The patient expressed understanding and agreed to proceed.    History of Present Illness: Angelica Diaz is a 43 y.o. who identifies as a female who was assigned female at birth, and is being seen today for flare of her IBS over the past couple of days. Diagnosed with IBS a couple of years ago with intermittent symptoms. Previously on dicyclomine as needed with flares causing significant cramping.  Notes flare yesterday with abdominal cramping -- generalized but more significant lower cramping. Notes BM every couple of days at this point. No change from her usual. Denies blood in the stool. Denies nausea or vomiting.  Denies recent change in diet or stress levels.  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 12/20/2007   Headache 06/25/2007   PAP SMEAR, ABNORMAL 01/07/2007    Allergies: No Known Allergies Medications:  Current Outpatient Medications:  amphetamine-dextroamphetamine (ADDERALL) 20 MG tablet, Take 20 mg by mouth every morning., Disp: , Rfl:    cloNIDine (CATAPRES) 0.2 MG tablet, Take 0.6 mg by mouth at bedtime., Disp: , Rfl:    dicyclomine (BENTYL) 20 MG tablet, Take 1 tablet (20 mg total) by mouth every 6 (six) hours., Disp: 30 tablet, Rfl: 0   eszopiclone (LUNESTA) 1 MG TABS  tablet, Take 1 mg by mouth at bedtime., Disp: , Rfl:    meclizine (ANTIVERT) 25 MG tablet, Take 1 tablet (25 mg total) by mouth 3 (three) times daily as needed for dizziness., Disp: 30 tablet, Rfl: 0   QUEtiapine (SEROQUEL) 50 MG tablet, Take 50 mg by mouth at bedtime., Disp: , Rfl:   Observations/Objective: Patient is well-developed, well-nourished in no acute distress.  Resting comfortably at home.  Head is normocephalic, atraumatic.  No labored breathing. Speech is clear and coherent with logical content.  Patient is alert and oriented at baseline.   Assessment and Plan: 1. Irritable bowel syndrome, unspecified type (Primary) - dicyclomine (BENTYL) 20 MG tablet; Take 1 tablet (20 mg total) by mouth every 6 (six) hours.  Dispense: 30 tablet; Refill: 0  No constipation presently, just cramping. Has used bentyl PRN over past few years without issue of worsening constipation. Will allow a one-time partial refill. Needs follow-up with GI. Bowel regimen reviewed. In person evaluation for any non-resolving, new or worsening symptoms.  Follow Up Instructions: I discussed the assessment and treatment plan with the patient. The patient was provided an opportunity to ask questions and all were answered. The patient agreed with the plan and demonstrated an understanding of the instructions.  A copy of instructions were sent to the patient via MyChart unless otherwise noted below.   The patient was advised to call back or seek an in-person evaluation if the symptoms worsen or if the condition fails to improve as anticipated.    Piedad Climes, PA-C

## 2023-06-23 NOTE — Patient Instructions (Signed)
  Ileene Rubens, thank you for joining Piedad Climes, PA-C for today's virtual visit.  While this provider is not your primary care provider (PCP), if your PCP is located in our provider database this encounter information will be shared with them immediately following your visit.   A [facility] MyChart account gives you access to today's visit and all your visits, tests, and labs performed at [facility] " click here if you don't have a [facility] MyChart account or go to [email or web address]  Consent: (Patient) Angelica Diaz provided verbal consent for this virtual visit at the beginning of the encounter.  Current Medications:  Current Outpatient Medications:    amphetamine-dextroamphetamine (ADDERALL) 20 MG tablet, Take 20 mg by mouth every morning., Disp: , Rfl:    benzonatate (TESSALON) 100 MG capsule, Take 1 capsule (100 mg total) by mouth 3 (three) times daily as needed for cough., Disp: 30 capsule, Rfl: 0   cloNIDine (CATAPRES) 0.2 MG tablet, Take 0.6 mg by mouth at bedtime., Disp: , Rfl:    dicyclomine (BENTYL) 20 MG tablet, TAKE 1 TABLET BY MOUTH EVERY 6 HOURS, Disp: 360 tablet, Rfl: 1   eszopiclone (LUNESTA) 1 MG TABS tablet, Take 1 mg by mouth at bedtime., Disp: , Rfl:    fluticasone (FLONASE) 50 MCG/ACT nasal spray, SPRAY 2 SPRAYS INTO EACH NOSTRIL EVERY DAY, Disp: 48 g, Rfl: 0   meclizine (ANTIVERT) 25 MG tablet, Take 1 tablet (25 mg total) by mouth 3 (three) times daily as needed for dizziness., Disp: 30 tablet, Rfl: 0   methylPREDNISolone (MEDROL DOSEPAK) 4 MG TBPK tablet, Take as prescribed on the box, Disp: 21 tablet, Rfl: 0   QUEtiapine (SEROQUEL) 50 MG tablet, Take 50 mg by mouth at bedtime., Disp: , Rfl:    Medications ordered in this encounter:  No orders of the defined types were placed in this encounter.    *If you need refills on other medications prior to your next appointment, please contact your pharmacy*  Follow-Up: Call back or  seek an in-person evaluation if the symptoms worsen or if the condition fails to improve as anticipated.  [facility] [phone]  Other Instructions I encourage you to increase hydration and the amount of fiber in your diet.  Start a daily probiotic (Align, Culturelle, Digestive Advantage, etc.). If no bowel movement within 24 hours, take 2 Tbs of Milk of Magnesia in a 4 oz glass of warmed prune juice every 2-3 days to help promote bowel movement. If no results within 24 hours, then repeat above regimen, adding a Dulcolax stool softener to regimen.    If you have been instructed to have an in-person evaluation today at a local Urgent Care facility, please use the link below. It will take you to a list of all of our available [facility], including address, phone number and hours of operation. Please do not delay care.  [facility]  If you or a family member do not have a primary care provider, use the link below to schedule a visit and establish care. When you choose a [facility] primary care physician or advanced practice provider, you gain a long-term partner in health. Find a Primary Care Provider  Learn more about [facility]'s in-office and virtual care options: [facility] - Get Care Now

## 2023-06-29 ENCOUNTER — Telehealth: Admitting: Medical

## 2023-06-29 VITALS — HR 78 | Temp 98.2°F

## 2023-06-29 DIAGNOSIS — R059 Cough, unspecified: Secondary | ICD-10-CM

## 2023-06-29 DIAGNOSIS — R0981 Nasal congestion: Secondary | ICD-10-CM | POA: Diagnosis not present

## 2023-06-29 DIAGNOSIS — J40 Bronchitis, not specified as acute or chronic: Secondary | ICD-10-CM

## 2023-06-29 MED ORDER — BENZONATATE 100 MG PO CAPS: 100.0000 mg | ORAL_CAPSULE | Freq: Three times a day (TID) | ORAL | 0 refills | Status: DC | PRN

## 2023-06-29 MED ORDER — ALBUTEROL SULFATE HFA 108 (90 BASE) MCG/ACT IN AERS: 2.0000 | INHALATION_SPRAY | Freq: Four times a day (QID) | RESPIRATORY_TRACT | 0 refills | Status: AC | PRN

## 2023-06-29 MED ORDER — FLUTICASONE PROPIONATE 50 MCG/ACT NA SUSP: 2.0000 | Freq: Every day | NASAL | 1 refills | Status: DC

## 2023-06-29 NOTE — Progress Notes (Signed)
 Virtual Visit via Video Note  I connected with Angelica Diaz on 06/29/23 at  1:20 PM EDT by a video enabled telemedicine application and verified that I am speaking with the correct person using two identifiers.  Location: Patient: [place] home Provider: office [place].  Pt did not have  vital signs. Does not have bp cuff. She does have 02 sat monitor. She needs to find then she will send me pulse and 02 sat% by my chart.   I discussed the limitations of evaluation and management by telemedicine and the availability of in person appointments. The patient expressed understanding and agreed to proceed.  History of Present Illness: Discussed the use of AI scribe software for clinical note transcription with the patient, who gave verbal consent to proceed.  History of Present Illness         Discussed the use of AI scribe software for clinical note transcription with the patient, who gave verbal consent to proceed.  History of Present Illness   Angelica Diaz is a 43 year old female with recurrent bronchitis who presents with chest congestion and cough.  She woke up yesterday with chest inflammation, which she associates with her recurrent bronchitis. Initially, she experienced a dry cough, which typically progresses to a productive cough over time. Currently, she is still experiencing a dry cough. No wheezing or shortness of breath at this stage.  She has a history of recurrent bronchitis, which often worsens by the third day, requiring intervention to prevent progression to pneumonia. She has previously used steroids and cough syrup for treatment. She has been using her nebulizer with albuterol treatments, which she finds more effective than her pocket inhaler. Her last inhaler prescription was in 2023, and she is unsure of the current status of her inhalers.  She smokes occasionally, having smoked on and off for over twenty years, typically about seven cigarettes a day when she does smoke. She  has not smoked since yesterday due to her chest symptoms.  No nasal congestion, sneezing, itchy eyes, or sinus pain, although she acknowledges sounding nasal congested. She has allergies to dust but does not experience seasonal allergies.  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         Observations/Objective: General-no acute distress, pleasant, oriented. Lungs- on inspection lungs appear unlabored. Neck- no tracheal deviation or jvd on inspection. Neuro- gross motor function appears intact. Heent- sounds nasal congested. But no frontal or maxillary sinus pressure on self palpatoin.   Assessment and Plan:        Assessment and Plan    Acute Bronchitis She reports waking up yesterday with chest inflammation and a dry cough, consistent with her usual episodes of bronchitis. She typically experiences wheezing by day three. Currently, she is using her nebulizer with albuterol, which provides relief. There is no wheezing or shortness of breath at present. She is an on-and-off smoker for over 20 years, which may predispose her to bronchitis. Due to the limitations of a video visit and the early stage of symptoms, antibiotics are not prescribed at this time. - Prescribe albuterol inhaler for use as needed. - Prescribe benzonatate for cough relief. - Prescribe Flonase for nasal congestion. - Advise her to hydrate well and use nebulizer treatments as needed. - Instruct her to send a MyChart message by Wednesday with an update on symptoms, particularly if cough becomes productive. - Consider prescribing azithromycin (Z-Pak) if symptoms worsen by Wednesday. - If  symptoms worsen on azithromycin, consider chest x-ray and in-office visit for lung examination.  General Health Maintenance Her last wellness exam was two years ago. Scheduling a wellness exam is recommended to assess overall health, including metabolic and lipid panels. - Schedule a fasting  wellness exam within the next two months to check metabolic panel and lipid panel.        Esperanza Richters, PA-C     Follow Up Instructions:    I discussed the assessment and treatment plan with the patient. The patient was provided an opportunity to ask questions and all were answered. The patient agreed with the plan and demonstrated an understanding of the instructions.   The patient was advised to call back or seek an in-person evaluation if the symptoms worsen or if the condition fails to improve as anticipated.   Esperanza Richters, PA-C

## 2023-06-29 NOTE — Patient Instructions (Signed)
 Acute Bronchitis She reports waking up yesterday with chest inflammation and a dry cough, consistent with her usual episodes of bronchitis. She typically experiences wheezing by day three. Currently, she is using her nebulizer with albuterol, which provides relief. There is no wheezing or shortness of breath at present. She is an on-and-off smoker for over 20 years, which may predispose her to bronchitis. Due to the limitations of a video visit and the early stage of symptoms, antibiotics are not prescribed at this time. - Prescribe albuterol inhaler for use as needed. - Prescribe benzonatate for cough relief. - Prescribe Flonase for nasal congestion. - Advise her to hydrate well and use nebulizer treatments as needed. - Instruct her to send a MyChart message by Wednesday with an update on symptoms, particularly if cough becomes productive. - Consider prescribing azithromycin (Z-Pak) if symptoms worsen by Wednesday. - If symptoms worsen on azithromycin, consider chest x-ray and in-office visit for lung examination.  General Health Maintenance Her last wellness exam was two years ago. Scheduling a wellness exam is recommended to assess overall health, including metabolic and lipid panels. - Schedule a fasting wellness exam within the next two months to check metabolic panel and lipid panel.

## 2023-06-30 ENCOUNTER — Encounter: Payer: Self-pay | Admitting: Medical

## 2023-07-01 MED ORDER — DOXYCYCLINE HYCLATE 100 MG PO TABS: 100.0000 mg | ORAL_TABLET | Freq: Two times a day (BID) | ORAL | 0 refills | Status: DC

## 2023-07-01 NOTE — Addendum Note (Signed)
 Addended by: Gwenevere Abbot on: 07/01/2023 09:43 AM   Modules accepted: Orders

## 2023-07-09 ENCOUNTER — Encounter (HOSPITAL_BASED_OUTPATIENT_CLINIC_OR_DEPARTMENT_OTHER): Payer: Self-pay | Admitting: Emergency Medicine

## 2023-07-09 ENCOUNTER — Emergency Department (HOSPITAL_BASED_OUTPATIENT_CLINIC_OR_DEPARTMENT_OTHER)
Admission: EM | Admit: 2023-07-09 | Discharge: 2023-07-09 | Disposition: A | Discharge status: Home / Self Care | Attending: Emergency Medicine | Admitting: Emergency Medicine

## 2023-07-09 ENCOUNTER — Other Ambulatory Visit (HOSPITAL_BASED_OUTPATIENT_CLINIC_OR_DEPARTMENT_OTHER): Payer: Self-pay

## 2023-07-09 ENCOUNTER — Other Ambulatory Visit: Payer: Self-pay

## 2023-07-09 DIAGNOSIS — H1031 Unspecified acute conjunctivitis, right eye: Secondary | ICD-10-CM | POA: Insufficient documentation

## 2023-07-09 DIAGNOSIS — H5711 Ocular pain, right eye: Secondary | ICD-10-CM | POA: Diagnosis present

## 2023-07-09 MED ORDER — TETRACAINE HCL 0.5 % OP SOLN
2.0000 [drp] | Freq: Once | OPHTHALMIC | Status: AC
  Administered 2023-07-09: 2 [drp] via OPHTHALMIC
  Filled 2023-07-09: qty 4

## 2023-07-09 MED ORDER — FLUORESCEIN SODIUM 1 MG OP STRP
1.0000 | ORAL_STRIP | Freq: Once | OPHTHALMIC | Status: AC
  Administered 2023-07-09: 1 via OPHTHALMIC
  Filled 2023-07-09: qty 1

## 2023-07-09 MED ORDER — OFLOXACIN 0.3 % OP SOLN
1.0000 [drp] | Freq: Four times a day (QID) | OPHTHALMIC | 0 refills | Status: DC
  Filled 2023-07-09: qty 5, 25d supply, fill #0

## 2023-07-09 NOTE — Discharge Instructions (Addendum)
 You were seen for your eye irritation in the emergency department.   At home, please take the eyedrops that we gave you.    Check your MyChart online for the results of any tests that had not resulted by the time you left the emergency department.   Follow-up with your eye doctor in 2-3 days regarding your visit.    Return immediately to the emergency department if you experience any of the following: worsening vision, or any other concerning symptoms.    Thank you for visiting our Emergency Department. It was a pleasure taking care of you today.

## 2023-07-09 NOTE — ED Triage Notes (Signed)
 Pain and drainage in right eye, tried not wearing contacts since but did not get better.

## 2023-07-09 NOTE — ED Provider Notes (Signed)
 [place] [facility] Provider Note   CSN: [id number] Arrival date & time: 07/09/23  2952     History  Chief Complaint  Patient presents with   Eye Problem    Angelica Diaz is a 43 y.o. female.  43 year old female with a history of contact use who presents emergency department with right eye pain and discharge.  Patient reports that yesterday in the morning she woke up with her contacts in place.  Reports that she started having some discharge and pain from her right eye.  Took the contact out but has had persistent pain.  Says that her vision appears to be grossly normal at this time out of the right eye without her contact in.  Does have some mild photophobia as well.       Home Medications Prior to Admission medications   Medication Sig Start Date End Date Taking? Authorizing Provider  ofloxacin (OCUFLOX) 0.3 % ophthalmic solution Place 1 drop into the right eye 4 (four) times daily. 07/09/23  Yes Rondel Baton, MD  albuterol (VENTOLIN HFA) 108 (90 Base) MCG/ACT inhaler Inhale 2 puffs into the lungs every 6 (six) hours as needed. 06/29/23   Saguier, Ramon Dredge, PA-C  amphetamine-dextroamphetamine (ADDERALL) 20 MG tablet Take 20 mg by mouth every morning. 01/31/22   [provider]  benzonatate (TESSALON) 100 MG capsule Take 1 capsule (100 mg total) by mouth 3 (three) times daily as needed for cough. 06/29/23   Saguier, Ramon Dredge, PA-C  cloNIDine (CATAPRES) 0.2 MG tablet Take 0.6 mg by mouth at bedtime. 02/07/20   [provider]  dicyclomine (BENTYL) 20 MG tablet Take 1 tablet (20 mg total) by mouth every 6 (six) hours. 06/23/23   Waldon Merl, PA-C  doxycycline (VIBRA-TABS) 100 MG tablet Take 1 tablet (100 mg total) by mouth 2 (two) times daily. 07/01/23   Saguier, Ramon Dredge, PA-C  eszopiclone (LUNESTA) 1 MG TABS tablet Take 1 mg by mouth at bedtime. 09/04/21   [provider]  fluticasone (FLONASE) 50 MCG/ACT nasal spray  Place 2 sprays into both nostrils daily. 06/29/23   Saguier, Ramon Dredge, PA-C  meclizine (ANTIVERT) 25 MG tablet Take 1 tablet (25 mg total) by mouth 3 (three) times daily as needed for dizziness. 11/10/22   Renne Crigler, PA-C  QUEtiapine (SEROQUEL) 50 MG tablet Take 50 mg by mouth at bedtime. 08/19/21   [provider]      Allergies    Patient has no known allergies.    Review of Systems   Review of Systems  Physical Exam Updated Vital Signs BP 117/69 (BP Location: Right Arm)   Pulse 69   Temp 97.9 F (36.6 C) (Oral)   Resp 18   Ht 5' 7.5" (1.715 m)   Wt 99.8 kg   LMP 05/22/2020   SpO2 100%   BMI 33.95 kg/m  Physical Exam Eyes:     General: Lids are normal. No visual field deficit.       Right eye: Discharge present.        Left eye: No discharge.     Intraocular pressure: Right eye pressure is 14 mmHg. Measurements were taken using a handheld tonometer.    Extraocular Movements: Extraocular movements intact.     Right eye: Normal extraocular motion and no nystagmus.     Left eye: Normal extraocular motion.     Conjunctiva/sclera:     Right eye: Right conjunctiva is injected. No chemosis, exudate or hemorrhage.  Left eye: Left conjunctiva is not injected. No chemosis, exudate or hemorrhage.    Pupils: Pupils are equal, round, and reactive to light.     ED Results / Procedures / Treatments   Labs (all labs ordered are listed, but only abnormal results are displayed) Labs Reviewed - No data to display  EKG None  Radiology No results found.  Procedures Procedures    Medications Ordered in ED Medications  tetracaine (PONTOCAINE) 0.5 % ophthalmic solution 2 drop (2 drops Both Eyes Given by Other 07/09/23 0737)  fluorescein ophthalmic strip 1 strip (1 strip Both Eyes Given by Other 07/09/23 0737)    ED Course/ Medical Decision Making/ A&P                                 Medical Decision Making Risk Prescription drug management.   Angelica Diaz  is a 43 y.o. female with comorbidities that complicate the patient evaluation including contact use who presents emergency department with right eye pain  Initial Ddx:  Bacterial conjunctivitis, viral conjunctivitis, corneal abrasion, iritis, optic neuritis  MDM/Course:  Patient presents emergency department with right eye pain, discharge, and redness.  Did sleep with her contacts in.  On exam does have significant conjunctival injection.  No consensual photophobia.  Eye pressure is normal.  Pupils are reactive bilaterally.  Fluorescein stain without focal uptake.  Patient says that her vision is grossly normal out of the right eye but does not have pupils and says that she cannot see much without them.  Suspect that she likely has conjunctivitis.  Will go ahead and treat her with antibiotic eyedrops in case it is bacterial conjunctivitis with her contact use.  Will have her follow-up with ophthalmology as an outpatient as well.  This patient presents to the ED for concern of complaints listed in HPI, this involves an extensive number of treatment options, and is a complaint that carries with it a high risk of complications and morbidity. Disposition including potential need for admission considered.   Dispo: DC Home. Return precautions discussed including, but not limited to, those listed in the AVS. Allowed pt time to ask questions which were answered fully prior to dc.  Records reviewed Outpatient Clinic Notes I have reviewed the patients home medications and made adjustments as needed  Portions of this note were generated with Dragon dictation software. Dictation errors may occur despite best attempts at proofreading.     Final Clinical Impression(s) / ED Diagnoses Final diagnoses:  Acute conjunctivitis of right eye, unspecified acute conjunctivitis type    Rx / DC Orders ED Discharge Orders          Ordered    ofloxacin (OCUFLOX) 0.3 % ophthalmic solution  4 times daily         07/09/23 0828              Rondel Baton, MD 07/09/23 1007

## 2023-09-04 ENCOUNTER — Ambulatory Visit: Admitting: Medical

## 2023-09-04 VITALS — BP 102/60 | HR 58 | Temp 98.0°F | Resp 16 | Ht 67.5 in | Wt 229.0 lb

## 2023-09-04 DIAGNOSIS — Z1159 Encounter for screening for other viral diseases: Secondary | ICD-10-CM | POA: Diagnosis not present

## 2023-09-04 DIAGNOSIS — H6992 Unspecified Eustachian tube disorder, left ear: Secondary | ICD-10-CM | POA: Diagnosis not present

## 2023-09-04 DIAGNOSIS — Z0000 Encounter for general adult medical examination without abnormal findings: Secondary | ICD-10-CM | POA: Diagnosis not present

## 2023-09-04 DIAGNOSIS — H6121 Impacted cerumen, right ear: Secondary | ICD-10-CM | POA: Diagnosis not present

## 2023-09-04 LAB — CBC WITH DIFFERENTIAL/PLATELET
Basophils Absolute: 0 10*3/uL (ref 0.0–0.1)
Basophils Relative: 0.6 % (ref 0.0–3.0)
Eosinophils Absolute: 0.1 10*3/uL (ref 0.0–0.7)
Eosinophils Relative: 1.1 % (ref 0.0–5.0)
HCT: 45.8 % (ref 36.0–46.0)
Hemoglobin: 15.2 g/dL — ABNORMAL HIGH (ref 12.0–15.0)
Lymphocytes Relative: 44.7 % (ref 12.0–46.0)
Lymphs Abs: 3 10*3/uL (ref 0.7–4.0)
MCHC: 33.2 g/dL (ref 30.0–36.0)
MCV: 96.6 fl (ref 78.0–100.0)
Monocytes Absolute: 0.3 10*3/uL (ref 0.1–1.0)
Monocytes Relative: 4.8 % (ref 3.0–12.0)
Neutro Abs: 3.3 10*3/uL (ref 1.4–7.7)
Neutrophils Relative %: 48.8 % (ref 43.0–77.0)
Platelets: 302 10*3/uL (ref 150.0–400.0)
RBC: 4.74 Mil/uL (ref 3.87–5.11)
RDW: 12.8 % (ref 11.5–15.5)
WBC: 6.7 10*3/uL (ref 4.0–10.5)

## 2023-09-04 LAB — LIPID PANEL
Cholesterol: 232 mg/dL — ABNORMAL HIGH (ref 0–200)
HDL: 46 mg/dL (ref >39.00)
LDL Cholesterol: 156 mg/dL — ABNORMAL HIGH (ref 0–99)
NonHDL: 186.19
Total CHOL/HDL Ratio: 5
Triglycerides: 153 mg/dL — ABNORMAL HIGH (ref 0.0–149.0)
VLDL: 30.6 mg/dL (ref 0.0–40.0)

## 2023-09-04 LAB — COMPREHENSIVE METABOLIC PANEL WITH GFR
ALT: 24 U/L (ref 0–35)
AST: 18 U/L (ref 0–37)
Albumin: 4.1 g/dL (ref 3.5–5.2)
Alkaline Phosphatase: 98 U/L (ref 39–117)
BUN: 22 mg/dL (ref 6–23)
CO2: 31 meq/L (ref 19–32)
Calcium: 9.7 mg/dL (ref 8.4–10.5)
Chloride: 103 meq/L (ref 96–112)
Creatinine, Ser: 0.78 mg/dL (ref 0.40–1.20)
GFR: 92.12 mL/min (ref >60.00)
Glucose, Bld: 91 mg/dL (ref 70–99)
Potassium: 5.1 meq/L (ref 3.5–5.1)
Sodium: 140 meq/L (ref 135–145)
Total Bilirubin: 0.5 mg/dL (ref 0.2–1.2)
Total Protein: 6.7 g/dL (ref 6.0–8.3)

## 2023-09-04 NOTE — Patient Instructions (Addendum)
 For you wellness exam today I have ordered cbc, cmp, hep c antibody and lipid panel.  Pcv 20 vaccine recommended. Declined today. If you research and change mind let us  now.  Consider stopping smoking. Advise try to call 1-800-quit-now.  Recommend exercise and healthy diet.  We will let you know lab results as they come in.  Follow up date appointment will be determined after lab review.    Eustachian tube dysfunction Clogged sensation in left ear due to eustachian tube pressure, no pain or congestion. - Advise Flonase  nasal spray for left eustachian tube pressure.  Cerumen impaction(verbal consent given before lavage. Right ear wax accumulation causing clogging sensation. Ear irrigation proposed with benefit vs risk explained. - Perform ear irrigation on right side. -post lavage all of wax removed.  Preventive Care 23-45 Years Old, Female Preventive care refers to lifestyle choices and visits with your health care provider that can promote health and wellness. Preventive care visits are also called wellness exams. What can I expect for my preventive care visit? Counseling Your health care provider may ask you questions about your: Medical history, including: Past medical problems. Family medical history. Pregnancy history. Current health, including: Menstrual cycle. Method of birth control. Emotional well-being. Home life and relationship well-being. Sexual activity and sexual health. Lifestyle, including: Alcohol, nicotine or tobacco, and drug use. Access to firearms. Diet, exercise, and sleep habits. Work and work Astronomer. Sunscreen use. Safety issues such as seatbelt and bike helmet use. Physical exam Your health care provider will check your: Height and weight. These may be used to calculate your BMI (body mass index). BMI is a measurement that tells if you are at a healthy weight. Waist circumference. This measures the distance around your waistline. This  measurement also tells if you are at a healthy weight and may help predict your risk of certain diseases, such as type 2 diabetes and high blood pressure. Heart rate and blood pressure. Body temperature. Skin for abnormal spots. What immunizations do I need?  Vaccines are usually given at various ages, according to a schedule. Your health care provider will recommend vaccines for you based on your age, medical history, and lifestyle or other factors, such as travel or where you work. What tests do I need? Screening Your health care provider may recommend screening tests for certain conditions. This may include: Lipid and cholesterol levels. Diabetes screening. This is done by checking your blood sugar (glucose) after you have not eaten for a while (fasting). Pelvic exam and Pap test. Hepatitis B test. Hepatitis C test. HIV (human immunodeficiency virus) test. STI (sexually transmitted infection) testing, if you are at risk. Lung cancer screening. Colorectal cancer screening. Mammogram. Talk with your health care provider about when you should start having regular mammograms. This may depend on whether you have a family history of breast cancer. BRCA-related cancer screening. This may be done if you have a family history of breast, ovarian, tubal, or peritoneal cancers. Bone density scan. This is done to screen for osteoporosis. Talk with your health care provider about your test results, treatment options, and if necessary, the need for more tests. Follow these instructions at home: Eating and drinking  Eat a diet that includes fresh fruits and vegetables, whole grains, lean protein, and low-fat dairy products. Take vitamin and mineral supplements as recommended by your health care provider. Do not drink alcohol if: Your health care provider tells you not to drink. You are pregnant, may be pregnant, or are planning to become  pregnant. If you drink alcohol: Limit how much you have to  0-1 drink a day. Know how much alcohol is in your drink. In the [place] one drink equals one 12 oz bottle of beer (355 mL), one 5 oz glass of wine (148 mL), or one 1 oz glass of hard liquor (44 mL). Lifestyle Brush your teeth every morning and night with fluoride toothpaste. Floss one time each day. Exercise for at least 30 minutes 5 or more days each week. Do not use any products that contain nicotine or tobacco. These products include cigarettes, chewing tobacco, and vaping devices, such as e-cigarettes. If you need help quitting, ask your health care provider. Do not use drugs. If you are sexually active, practice safe sex. Use a condom or other form of protection to prevent STIs. If you do not wish to become pregnant, use a form of birth control. If you plan to become pregnant, see your health care provider for a prepregnancy visit. Take aspirin only as told by your health care provider. Make sure that you understand how much to take and what form to take. Work with your health care provider to find out whether it is safe and beneficial for you to take aspirin daily. Find healthy ways to manage stress, such as: Meditation, yoga, or listening to music. Journaling. Talking to a trusted person. Spending time with friends and family. Minimize exposure to UV radiation to reduce your risk of skin cancer. Safety Always wear your seat belt while driving or riding in a vehicle. Do not drive: If you have been drinking alcohol. Do not ride with someone who has been drinking. When you are tired or distracted. While texting. If you have been using any mind-altering substances or drugs. Wear a helmet and other protective equipment during sports activities. If you have firearms in your house, make sure you follow all gun safety procedures. Seek help if you have been physically or sexually abused. What's next? Visit your health care provider once a year for an annual wellness visit. Ask your health  care provider how often you should have your eyes and teeth checked. Stay up to date on all vaccines. This information is not intended to replace advice given to you by your health care provider. Make sure you discuss any questions you have with your health care provider. Document Revised: 09/12/2020 Document Reviewed: 09/12/2020 Elsevier Patient Education  2024 [employer].

## 2023-09-04 NOTE — Progress Notes (Signed)
 Subjective:    Patient ID: Angelica Diaz, female    DOB: Jan 24, 1981, 43 y.o.   MRN: [id number]  HPI  She is her for wellness exam. Pt is fasting.   Pt not exercising. She is not eating healthy. No soda. Smoking pack every 3 days. Alcohol once every other week margarita.  Pt due for mammogram. Will order today. Pt had hysterectomy march 2022.  Pt up to date on colonoscopy.  Pcv 20 vaccine recommended but declined.  Angelica Diaz is a 43 year old female who presents with a sensation of clogged ears, primarily on the left side.  She has experienced a sensation of clogged ears, predominantly on the left side, for the past two weeks. The sensation is described as if there is something in the ear, but there is no associated pain. She has attempted to use ear wax drops without relief.  The right ear also feels slightly clogged, but the sensation is more pronounced on the left. There are no associated symptoms such as nasal congestion, sneezing, itchy eyes, runny nose, fevers, or chills. She denies any ear pain and has no history of nasal congestion or allergies preceding the ear symptoms.  She has Flonase  at home but has not yet used it.   Review of Systems  Constitutional:  Negative for chills, fatigue and fever.  HENT:         See hpi  Respiratory:  Negative for cough, chest tightness, shortness of breath and wheezing.   Cardiovascular:  Negative for chest pain and palpitations.  Gastrointestinal:  Negative for abdominal pain, blood in stool, diarrhea and nausea.  Genitourinary:  Negative for flank pain, frequency and urgency.  Musculoskeletal:  Negative for back pain and myalgias.  Skin:  Negative for rash.  Neurological:  Negative for dizziness, weakness and headaches.  Hematological:  Negative for adenopathy. Does not bruise/bleed easily.  Psychiatric/Behavioral:  Negative for behavioral problems and decreased concentration.     Past Medical History:  Diagnosis Date    Allergy    Anxiety    Arthritis    Asthma    Bipolar I disorder (HCC) 05/18/2015   Chronic headaches    Depression    History of 2019 novel coronavirus disease (COVID-19) 03/10/2019   per pt asymptomatic was exposed ,  results in care everywhere   History of asthma    as teen   Insomnia    Meningioma, cerebral (HCC)    left frontal , asymptomatic; neurologist--- dr c. patel  (MRI 11-06-2018 in epic)   Menorrhagia    Migraine    Pneumonia    Wears contact lenses      Social History   Socioeconomic History   Marital status: Divorced    Spouse name: Not on file   Number of children: 2   Years of education: Not on file   Highest education level: Not on file  Occupational History   Occupation: Advertising account planner    Employer: [employer]   Tobacco Use   Smoking status: Every Day    Types: Cigarettes   Smokeless tobacco: Never   Tobacco comments:    06-21-2021 per pt 7-8 cig per day  Vaping Use   Vaping status: Never Used  Substance and Sexual Activity   Alcohol use: Yes    Comment: socially, rarely   Drug use: Never   Sexual activity: Not on file  Other Topics Concern   Not on file  Social History Narrative   Unemployed.  Applying for disability.      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  She has two healthy children.      Lives in single story home.      Right handed.      Highest level of edu- highschool      Social Drivers of Health   Financial Resource Strain: Patient Declined (09/04/2023)   Overall Financial Resource Strain (CARDIA)    Difficulty of Paying Living Expenses: Patient declined  Food Insecurity: Patient Declined (09/04/2023)   Hunger Vital Sign    Worried About Running Out of Food in the Last Year: Patient declined    Ran Out of Food in the Last Year: Patient declined  Transportation Needs: Patient Declined (09/04/2023)   PRAPARE - Administrator, Civil Service (Medical): Patient declined    Lack of Transportation (Non-Medical): Patient  declined  Physical Activity: Unknown (09/04/2023)   Exercise Vital Sign    Days of Exercise per Week: Patient declined    Minutes of Exercise per Session: Not on file  Stress: Patient Declined (09/04/2023)   [employer] of Occupational Health - Occupational Stress Questionnaire    Feeling of Stress : Patient declined  Social Connections: Unknown (09/04/2023)   Social Connection and Isolation Panel [NHANES]    Frequency of Communication with Friends and Family: Patient declined    Frequency of Social Gatherings with Friends and Family: Patient declined    Attends Religious Services: Never    Database administrator or Organizations: No    Attends Engineer, structural: Not on file    Marital Status: Divorced  Intimate Partner Violence: Not on file    Past Surgical History:  Procedure Laterality Date   CESAREAN SECTION  1999   CHOLECYSTECTOMY N/A 05/27/2013   Procedure: LAPAROSCOPIC CHOLECYSTECTOMY WITH INTRAOPERATIVE CHOLANGIOGRAM;  Surgeon: Keitha Pata, MD;  Location: [facility];  Service: General;  Laterality: N/A;   dental implants  2018   upper   DILATION AND CURETTAGE OF UTERUS  yrs ago   DILITATION & CURRETTAGE/HYSTROSCOPY WITH NOVASURE ABLATION N/A 03/20/2020   Procedure: DILATATION & CURETTAGE/HYSTEROSCOPY WITH NOVASURE ABLATION;  Surgeon: Terri Fester, MD;  Location[facility];  Service: Gynecology;  Laterality: N/A;   fallopian tubes removed  2018   ROBOTIC ASSISTED TOTAL HYSTERECTOMY N/A 06/13/2020   Procedure: XI ROBOTIC ASSISTED TOTAL HYSTERECTOMY;  Surgeon: Terri Fester, MD;  Location: [facility];  Service: Gynecology;  Laterality: N/A;  Requests 3hrs.   WISDOM TOOTH EXTRACTION  yrs ago    Family History  Problem Relation Age of Onset   Migraines Mother    Colon cancer Maternal Uncle        71s   Colon cancer Maternal Grandmother        age unknown   Diabetes Maternal Grandfather    Colon cancer Maternal Grandfather         age unknown   Migraines Son    Throat cancer Neg Hx    Pancreatic cancer Neg Hx    Stomach cancer Neg Hx    Heart disease Neg Hx    Kidney disease Neg Hx    Liver disease Neg Hx    Rectal cancer Neg Hx    Esophageal cancer Neg Hx     No Known Allergies  Current Outpatient Medications on File Prior to Visit  Medication Sig Dispense Refill   albuterol  (VENTOLIN  HFA) 108 (90 Base) MCG/ACT inhaler Inhale 2 puffs into the lungs every 6 (six)  hours as needed. 18 g 0   amphetamine-dextroamphetamine (ADDERALL) 20 MG tablet Take 20 mg by mouth every morning.     cloNIDine  (CATAPRES ) 0.2 MG tablet Take 0.6 mg by mouth at bedtime.     dicyclomine  (BENTYL ) 20 MG tablet Take 1 tablet (20 mg total) by mouth every 6 (six) hours. 30 tablet 0   eszopiclone (LUNESTA) 1 MG TABS tablet Take 1 mg by mouth at bedtime.     QUEtiapine (SEROQUEL) 50 MG tablet Take 50 mg by mouth at bedtime.     No current facility-administered medications on file prior to visit.    BP 102/60 (BP Location: Right Arm, Patient Position: Sitting, Cuff Size: Large)   Pulse (!) 58   Temp 98 F (36.7 C) (Oral)   Resp 16   Ht 5' 7.5" (1.715 m)   Wt 229 lb (103.9 kg)   LMP 05/22/2020   SpO2 100%   BMI 35.34 kg/m        Objective:   Physical Exam   General Mental Status- Alert. General Appearance- Not in acute distress.   Skin General: Color- Normal Color. Moisture- Normal Moisture.  Neck Carotid Arteries- Normal color. Moisture- Normal Moisture. No carotid bruits. No JVD.  Chest and Lung Exam Auscultation: Breath Sounds:-Normal.  Cardiovascular Auscultation:Rythm- Regular. Murmurs & Other Heart Sounds:Auscultation of the heart reveals- No Murmurs.  Abdomen Inspection:-Inspeection Normal. Palpation/Percussion:Note:No mass. Palpation and Percussion of the abdomen reveal- Non Tender, Non Distended + BS, no rebound or guarding.  Heent- No sinus pressure.  Left ear- canal clear and tm normal. Rt ear-  blocked with wax. After lavage canal normal, wax cleared and normal tm.  Neurologic Cranial Nerve exam:- CN III-XII intact(No nystagmus), symmetric smile. Strength:- 5/5 equal and symmetric strength both upper and lower extremities.      Assessment & Plan:   Patient Instructions  For you wellness exam today I have ordered cbc, cmp, hep c antibody and lipid panel.  Pcv 20 vaccine recommended. Declined today. If you research and change mind let us  now.  Consider stopping smoking. Advise try to call 1-800-quit-now.  Recommend exercise and healthy diet.  We will let you know lab results as they come in.  Follow up date appointment will be determined after lab review.    For cerumen impaction lavaged rt ear.  Left ear canal clear and tm normal. So think eustachian tube dysfunction    Eustachian tube dysfunction Clogged sensation in left ear due to eustachian tube pressure, no pain or congestion. - Advise Flonase  nasal spray for left eustachian tube pressure.  Cerumen impaction(verbal consent given before lavage. Right ear wax accumulation causing clogging sensation. Ear irrigation proposed with benefit vs risk explained. - Perform ear irrigation on right side. -post lavage all of wax removed.  Sylvia Everts, PA-C   [id number] charge as addressed cerumen impaction and eustachian tube dysfuction.

## 2023-09-05 ENCOUNTER — Ambulatory Visit: Payer: Self-pay | Admitting: Medical

## 2023-09-05 LAB — HEPATITIS C ANTIBODY: Hepatitis C Ab: NONREACTIVE

## 2023-10-23 IMAGING — US US EXTREM LOW VENOUS
1 series · 14 of 24 positions shown · non-contrast
Comparison: None.

CLINICAL DATA: Bilateral lower extremity pain. Jumper sign and
dyspnea.

EXAM:
Bilateral LOWER EXTREMITY VENOUS DOPPLER ULTRASOUND
TECHNIQUE: Gray-scale sonography with compression, as well as color and duplex
ultrasound, were performed to evaluate the deep venous system(s)
from the level of the common femoral vein through the popliteal and
proximal calf veins.

[Series 1: us extrem low venous · 14 of 65 slices shown]
[im 1/65]
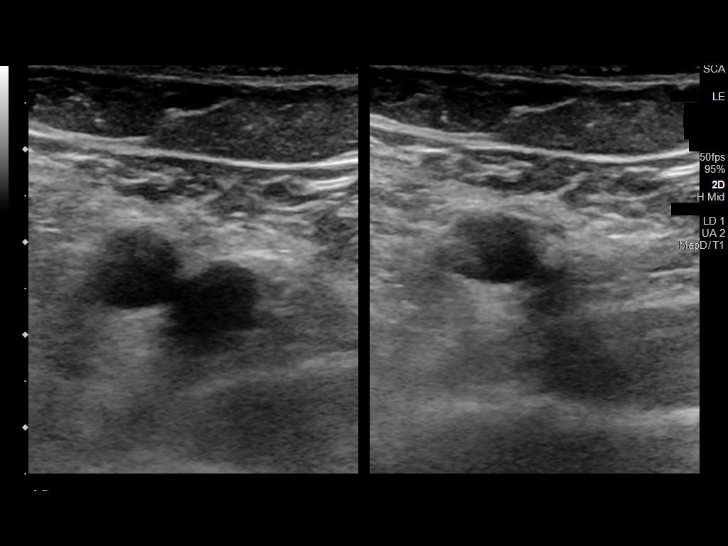
[im 6/65]
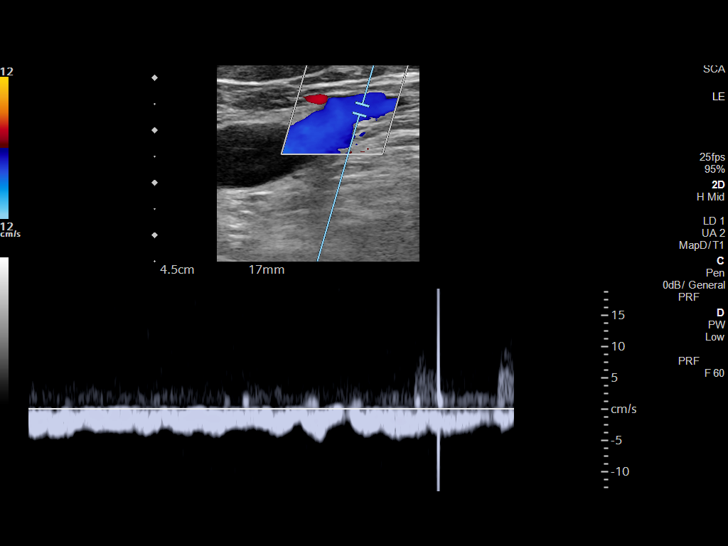
[im 12/65]
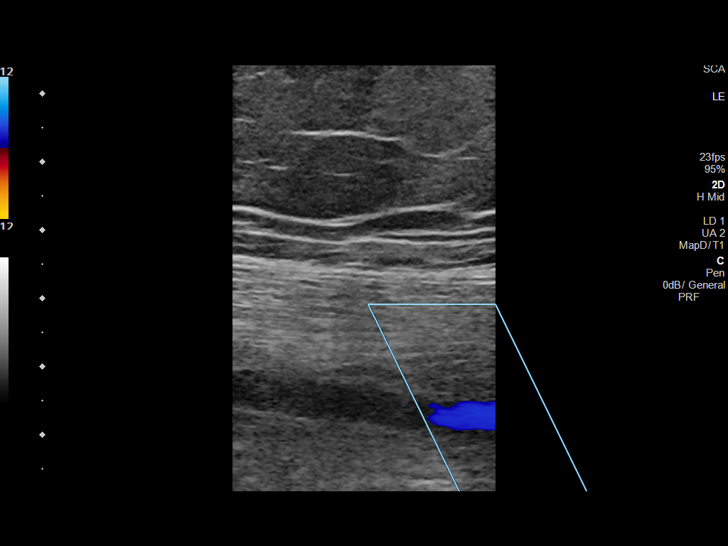
[im 17/65]
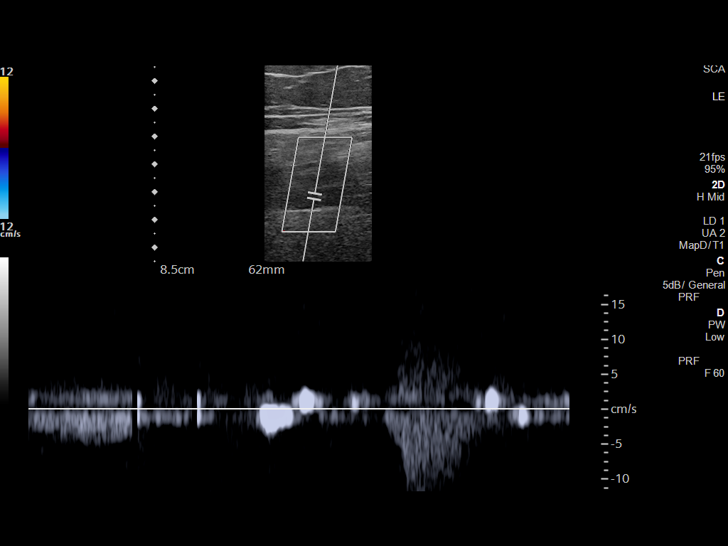
[im 20/65]
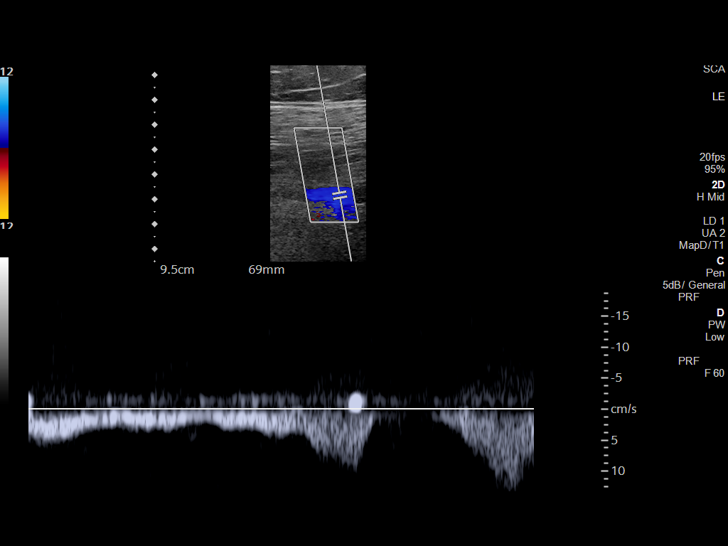
[im 26/65]
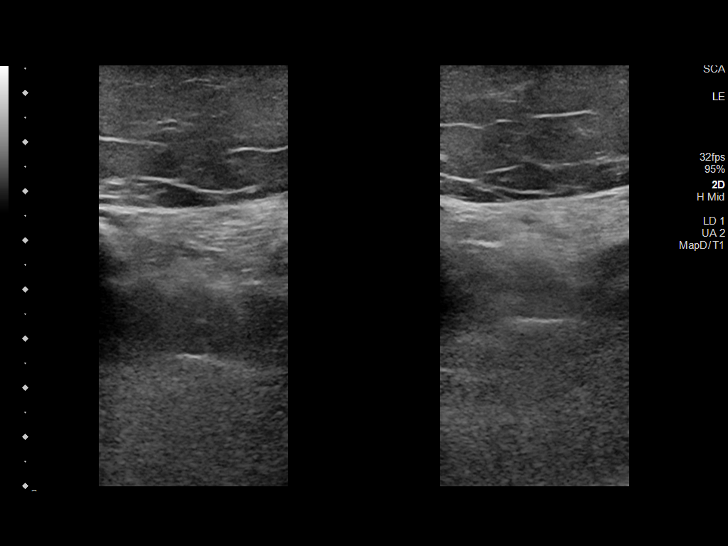
[im 31/65]
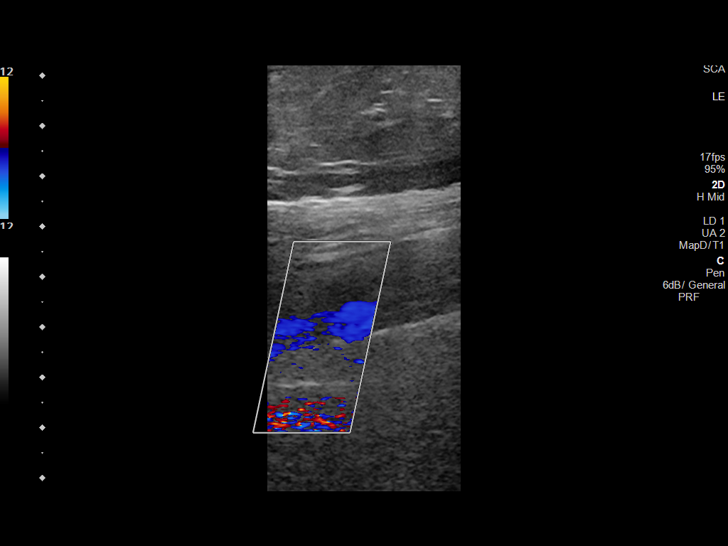
[im 34/65]
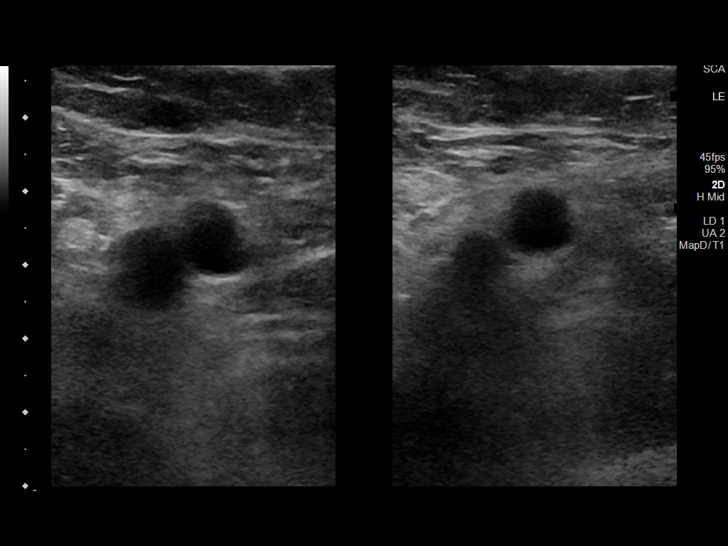
[im 39/65]
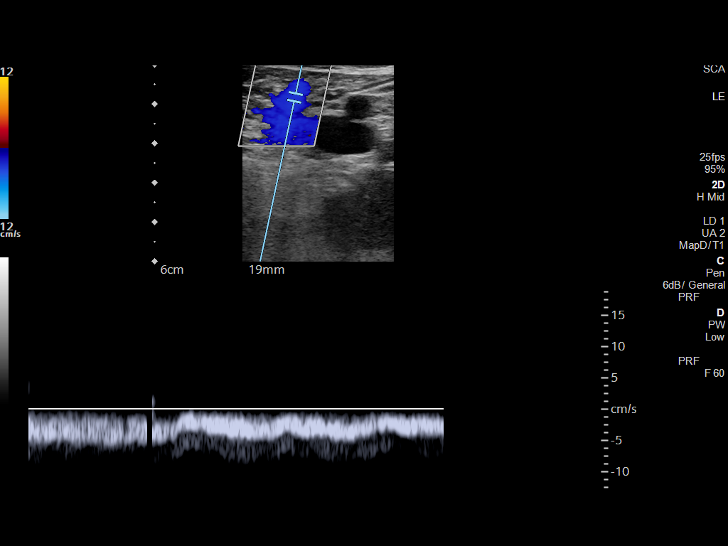
[im 45/65]
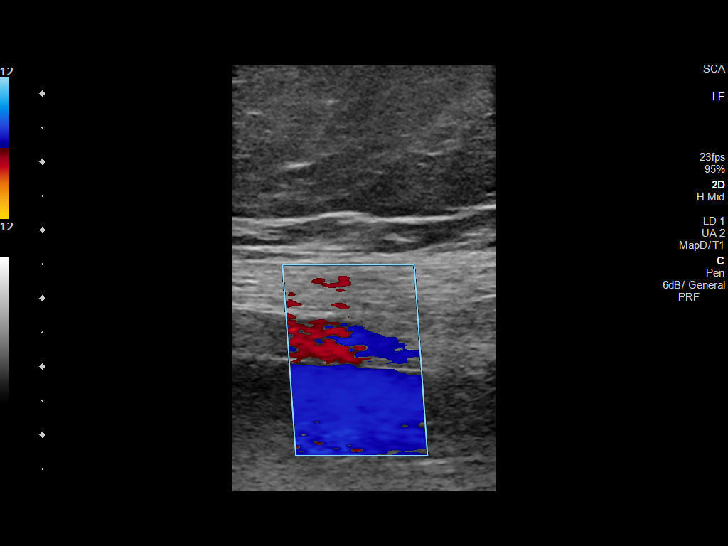
[im 51/65]
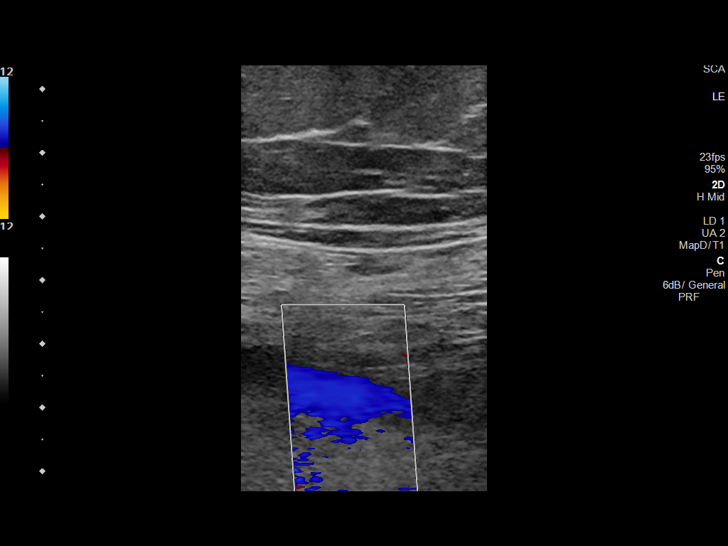
[im 53/65]
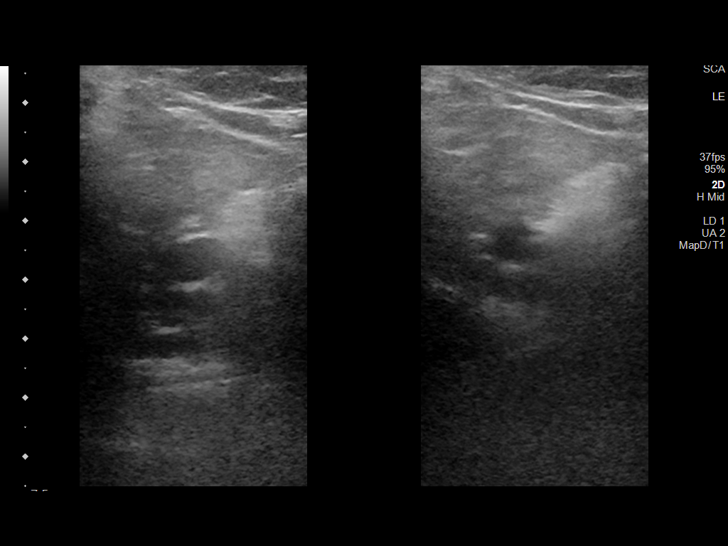
[im 59/65]
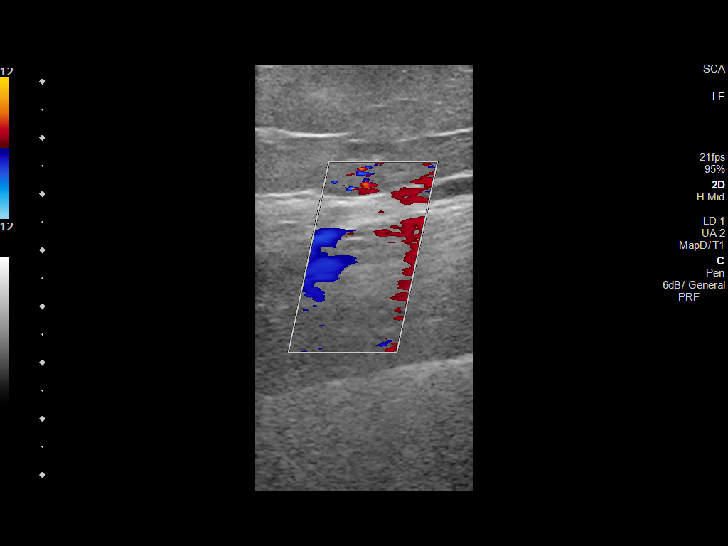
[im 65/65]
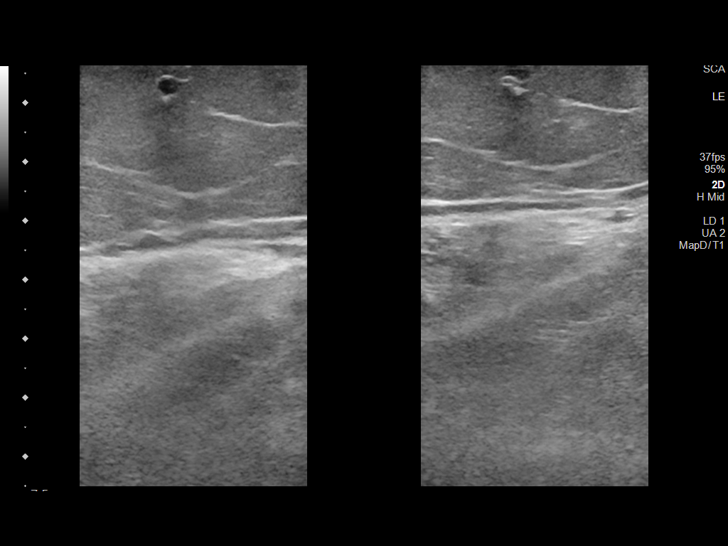

[14 of 24 positions shown; findings below may reference images not displayed]

FINDINGS: VENOUS

Normal compressibility of the common femoral, superficial femoral,
and popliteal veins, as well as the visualized calf veins.
Visualized portions of profunda femoral vein and great saphenous
vein unremarkable. No filling defects to suggest DVT on grayscale or
color Doppler imaging. Doppler waveforms show normal direction of
venous flow, normal respiratory plasticity and response to
augmentation.

OTHER

None.

Limitations: none
IMPRESSION: No lower extremity DVT is identified.

## 2023-12-30 ENCOUNTER — Ambulatory Visit: Admitting: Medical

## 2024-01-08 ENCOUNTER — Ambulatory Visit: Admitting: Medical
